# Patient Record
Sex: Male | Born: 1935 | Race: White | Hispanic: No | Marital: Married | Smoking: Former smoker
Health system: Southern US, Community
[De-identification: ages and names within clinical notes are randomized; demographics above are authoritative.]

## PROBLEM LIST (undated history)

## (undated) DIAGNOSIS — Z8673 Personal history of transient ischemic attack (TIA), and cerebral infarction without residual deficits: Secondary | ICD-10-CM

## (undated) DIAGNOSIS — IMO0001 Reserved for inherently not codable concepts without codable children: Secondary | ICD-10-CM

## (undated) DIAGNOSIS — J449 Chronic obstructive pulmonary disease, unspecified: Secondary | ICD-10-CM

## (undated) DIAGNOSIS — I471 Supraventricular tachycardia, unspecified: Secondary | ICD-10-CM

## (undated) DIAGNOSIS — K449 Diaphragmatic hernia without obstruction or gangrene: Secondary | ICD-10-CM

## (undated) DIAGNOSIS — D369 Benign neoplasm, unspecified site: Secondary | ICD-10-CM

## (undated) DIAGNOSIS — K219 Gastro-esophageal reflux disease without esophagitis: Secondary | ICD-10-CM

## (undated) DIAGNOSIS — I10 Essential (primary) hypertension: Secondary | ICD-10-CM

## (undated) DIAGNOSIS — M1990 Unspecified osteoarthritis, unspecified site: Secondary | ICD-10-CM

## (undated) DIAGNOSIS — I480 Paroxysmal atrial fibrillation: Secondary | ICD-10-CM

## (undated) DIAGNOSIS — I2510 Atherosclerotic heart disease of native coronary artery without angina pectoris: Secondary | ICD-10-CM

## (undated) DIAGNOSIS — L723 Sebaceous cyst: Secondary | ICD-10-CM

## (undated) DIAGNOSIS — R011 Cardiac murmur, unspecified: Secondary | ICD-10-CM

## (undated) DIAGNOSIS — E785 Hyperlipidemia, unspecified: Secondary | ICD-10-CM

## (undated) DIAGNOSIS — J189 Pneumonia, unspecified organism: Secondary | ICD-10-CM

## (undated) DIAGNOSIS — F419 Anxiety disorder, unspecified: Secondary | ICD-10-CM

## (undated) DIAGNOSIS — I440 Atrioventricular block, first degree: Secondary | ICD-10-CM

## (undated) HISTORY — DX: Chronic obstructive pulmonary disease, unspecified: J44.9

## (undated) HISTORY — DX: Supraventricular tachycardia: I47.1

## (undated) HISTORY — DX: Sebaceous cyst: L72.3

## (undated) HISTORY — DX: Gastro-esophageal reflux disease without esophagitis: K21.9

## (undated) HISTORY — DX: Personal history of transient ischemic attack (TIA), and cerebral infarction without residual deficits: Z86.73

## (undated) HISTORY — DX: Diaphragmatic hernia without obstruction or gangrene: K44.9

## (undated) HISTORY — DX: Atrioventricular block, first degree: I44.0

## (undated) HISTORY — DX: Atherosclerotic heart disease of native coronary artery without angina pectoris: I25.10

## (undated) HISTORY — DX: Hyperlipidemia, unspecified: E78.5

## (undated) HISTORY — DX: Anxiety disorder, unspecified: F41.9

## (undated) HISTORY — DX: Benign neoplasm, unspecified site: D36.9

## (undated) HISTORY — DX: Supraventricular tachycardia, unspecified: I47.10

## (undated) HISTORY — DX: Paroxysmal atrial fibrillation: I48.0

---

## 1978-09-12 HISTORY — PX: BACK SURGERY: SHX140

## 1998-04-25 ENCOUNTER — Emergency Department (HOSPITAL_COMMUNITY): Admission: EM | Admit: 1998-04-25 | Discharge: 1998-04-25 | Payer: Self-pay | Admitting: Emergency Medicine

## 2003-12-19 ENCOUNTER — Emergency Department (HOSPITAL_COMMUNITY): Admission: EM | Admit: 2003-12-19 | Discharge: 2003-12-19 | Payer: Self-pay | Admitting: *Deleted

## 2004-10-03 ENCOUNTER — Inpatient Hospital Stay (HOSPITAL_COMMUNITY): Admission: EM | Admit: 2004-10-03 | Discharge: 2004-10-04 | Payer: Self-pay | Admitting: Emergency Medicine

## 2005-06-14 ENCOUNTER — Ambulatory Visit: Payer: Self-pay | Admitting: Gastroenterology

## 2005-07-05 ENCOUNTER — Encounter (INDEPENDENT_AMBULATORY_CARE_PROVIDER_SITE_OTHER): Payer: Self-pay | Admitting: *Deleted

## 2005-07-05 ENCOUNTER — Ambulatory Visit: Payer: Self-pay | Admitting: Gastroenterology

## 2005-09-09 ENCOUNTER — Emergency Department (HOSPITAL_COMMUNITY): Admission: EM | Admit: 2005-09-09 | Discharge: 2005-09-09 | Payer: Self-pay | Admitting: Emergency Medicine

## 2005-09-27 ENCOUNTER — Ambulatory Visit (HOSPITAL_COMMUNITY): Admission: RE | Admit: 2005-09-27 | Discharge: 2005-09-27 | Payer: Self-pay | Admitting: Family Medicine

## 2005-10-05 ENCOUNTER — Ambulatory Visit (HOSPITAL_COMMUNITY): Admission: RE | Admit: 2005-10-05 | Discharge: 2005-10-05 | Payer: Self-pay | Admitting: General Surgery

## 2005-10-14 ENCOUNTER — Emergency Department (HOSPITAL_COMMUNITY): Admission: EM | Admit: 2005-10-14 | Discharge: 2005-10-14 | Payer: Self-pay | Admitting: Emergency Medicine

## 2006-02-26 ENCOUNTER — Encounter: Payer: Self-pay | Admitting: Emergency Medicine

## 2006-02-26 ENCOUNTER — Inpatient Hospital Stay (HOSPITAL_COMMUNITY): Admission: EM | Admit: 2006-02-26 | Discharge: 2006-03-02 | Payer: Self-pay | Admitting: Internal Medicine

## 2006-05-06 ENCOUNTER — Observation Stay (HOSPITAL_COMMUNITY): Admission: EM | Admit: 2006-05-06 | Discharge: 2006-05-07 | Payer: Self-pay | Admitting: *Deleted

## 2006-05-19 ENCOUNTER — Ambulatory Visit (HOSPITAL_COMMUNITY): Admission: RE | Admit: 2006-05-19 | Discharge: 2006-05-19 | Payer: Self-pay | Admitting: Internal Medicine

## 2006-05-19 ENCOUNTER — Encounter: Payer: Self-pay | Admitting: Vascular Surgery

## 2006-09-12 HISTORY — PX: CARDIAC ELECTROPHYSIOLOGY MAPPING AND ABLATION: SHX1292

## 2007-09-13 HISTORY — PX: CARDIOVERSION: SHX1299

## 2007-11-14 ENCOUNTER — Inpatient Hospital Stay (HOSPITAL_COMMUNITY): Admission: EM | Admit: 2007-11-14 | Discharge: 2007-11-16 | Payer: Self-pay | Admitting: Emergency Medicine

## 2007-12-17 ENCOUNTER — Encounter: Payer: Self-pay | Admitting: Family Medicine

## 2007-12-17 ENCOUNTER — Ambulatory Visit: Payer: Self-pay

## 2007-12-24 ENCOUNTER — Ambulatory Visit: Payer: Self-pay | Admitting: *Deleted

## 2007-12-24 ENCOUNTER — Inpatient Hospital Stay (HOSPITAL_COMMUNITY): Admission: EM | Admit: 2007-12-24 | Discharge: 2008-01-03 | Payer: Self-pay | Admitting: Emergency Medicine

## 2008-01-08 ENCOUNTER — Ambulatory Visit: Payer: Self-pay | Admitting: Cardiovascular Disease

## 2008-01-14 ENCOUNTER — Ambulatory Visit: Payer: Self-pay | Admitting: Internal Medicine

## 2008-01-23 ENCOUNTER — Ambulatory Visit: Payer: Self-pay | Admitting: Cardiology

## 2008-02-07 ENCOUNTER — Ambulatory Visit: Payer: Self-pay | Admitting: Cardiology

## 2008-02-13 ENCOUNTER — Ambulatory Visit: Payer: Self-pay | Admitting: Cardiology

## 2008-02-13 ENCOUNTER — Ambulatory Visit: Payer: Self-pay | Admitting: Internal Medicine

## 2008-02-22 ENCOUNTER — Ambulatory Visit: Payer: Self-pay | Admitting: Cardiovascular Disease

## 2008-02-22 ENCOUNTER — Ambulatory Visit: Payer: Self-pay

## 2008-03-07 ENCOUNTER — Ambulatory Visit: Payer: Self-pay | Admitting: Cardiology

## 2008-03-13 ENCOUNTER — Ambulatory Visit: Payer: Self-pay | Admitting: Internal Medicine

## 2008-03-13 LAB — CONVERTED CEMR LAB
BUN: 10 mg/dL (ref 6–23)
CO2: 30 meq/L (ref 19–32)
Chloride: 104 meq/L (ref 96–112)
Eosinophils Relative: 12.5 % — ABNORMAL HIGH (ref 0.0–5.0)
Glucose, Bld: 98 mg/dL (ref 70–99)
INR: 3.4 — ABNORMAL HIGH (ref 0.8–1.0)
Monocytes Relative: 7.5 % (ref 3.0–12.0)
Neutrophils Relative %: 57.9 % (ref 43.0–77.0)
Platelets: 213 10*3/uL (ref 150–400)
Potassium: 4.1 meq/L (ref 3.5–5.1)
WBC: 8.4 10*3/uL (ref 4.5–10.5)

## 2008-03-17 ENCOUNTER — Inpatient Hospital Stay (HOSPITAL_BASED_OUTPATIENT_CLINIC_OR_DEPARTMENT_OTHER): Admission: RE | Admit: 2008-03-17 | Discharge: 2008-03-17 | Payer: Self-pay | Admitting: Cardiovascular Disease

## 2008-03-17 ENCOUNTER — Ambulatory Visit: Payer: Self-pay | Admitting: Cardiology

## 2008-03-25 ENCOUNTER — Ambulatory Visit: Payer: Self-pay | Admitting: Internal Medicine

## 2008-05-05 ENCOUNTER — Ambulatory Visit: Payer: Self-pay | Admitting: Internal Medicine

## 2008-05-05 LAB — CONVERTED CEMR LAB
BUN: 13 mg/dL (ref 6–23)
CO2: 32 meq/L (ref 19–32)
Chloride: 108 meq/L (ref 96–112)
Creatinine, Ser: 1 mg/dL (ref 0.4–1.5)

## 2008-05-12 ENCOUNTER — Ambulatory Visit: Payer: Self-pay | Admitting: Cardiology

## 2008-05-26 ENCOUNTER — Ambulatory Visit: Payer: Self-pay | Admitting: Cardiology

## 2008-06-05 ENCOUNTER — Ambulatory Visit: Payer: Self-pay | Admitting: Cardiology

## 2008-06-05 ENCOUNTER — Emergency Department (HOSPITAL_COMMUNITY): Admission: EM | Admit: 2008-06-05 | Discharge: 2008-06-05 | Payer: Self-pay | Admitting: Emergency Medicine

## 2008-06-09 ENCOUNTER — Inpatient Hospital Stay (HOSPITAL_COMMUNITY): Admission: EM | Admit: 2008-06-09 | Discharge: 2008-06-10 | Payer: Self-pay | Admitting: Emergency Medicine

## 2008-06-16 ENCOUNTER — Ambulatory Visit: Payer: Self-pay | Admitting: Cardiology

## 2008-06-23 ENCOUNTER — Ambulatory Visit: Payer: Self-pay | Admitting: Cardiology

## 2008-06-26 ENCOUNTER — Ambulatory Visit: Payer: Self-pay | Admitting: Internal Medicine

## 2008-06-30 ENCOUNTER — Ambulatory Visit: Payer: Self-pay | Admitting: Internal Medicine

## 2008-07-14 ENCOUNTER — Ambulatory Visit: Payer: Self-pay | Admitting: Cardiovascular Disease

## 2008-07-28 ENCOUNTER — Ambulatory Visit: Payer: Self-pay | Admitting: Cardiology

## 2008-08-13 ENCOUNTER — Ambulatory Visit: Payer: Self-pay | Admitting: Internal Medicine

## 2008-08-20 ENCOUNTER — Ambulatory Visit: Payer: Self-pay | Admitting: Internal Medicine

## 2008-08-21 ENCOUNTER — Emergency Department (HOSPITAL_COMMUNITY): Admission: EM | Admit: 2008-08-21 | Discharge: 2008-08-21 | Payer: Self-pay | Admitting: Emergency Medicine

## 2008-08-24 ENCOUNTER — Inpatient Hospital Stay (HOSPITAL_COMMUNITY): Admission: EM | Admit: 2008-08-24 | Discharge: 2008-08-28 | Payer: Self-pay | Admitting: Emergency Medicine

## 2008-09-01 ENCOUNTER — Ambulatory Visit: Payer: Self-pay | Admitting: Cardiology

## 2008-09-22 ENCOUNTER — Ambulatory Visit: Payer: Self-pay | Admitting: Internal Medicine

## 2008-09-22 ENCOUNTER — Observation Stay (HOSPITAL_COMMUNITY): Admission: EM | Admit: 2008-09-22 | Discharge: 2008-09-23 | Payer: Self-pay | Admitting: Emergency Medicine

## 2008-09-23 ENCOUNTER — Encounter: Payer: Self-pay | Admitting: Internal Medicine

## 2008-09-26 ENCOUNTER — Ambulatory Visit: Payer: Self-pay | Admitting: Cardiology

## 2008-09-26 ENCOUNTER — Ambulatory Visit: Payer: Self-pay | Admitting: Internal Medicine

## 2008-10-09 ENCOUNTER — Ambulatory Visit: Payer: Self-pay | Admitting: Internal Medicine

## 2008-10-22 ENCOUNTER — Ambulatory Visit: Payer: Self-pay | Admitting: Internal Medicine

## 2008-11-19 ENCOUNTER — Inpatient Hospital Stay (HOSPITAL_COMMUNITY): Admission: EM | Admit: 2008-11-19 | Discharge: 2008-11-24 | Payer: Self-pay | Admitting: Emergency Medicine

## 2009-02-08 ENCOUNTER — Emergency Department (HOSPITAL_COMMUNITY): Admission: EM | Admit: 2009-02-08 | Discharge: 2009-02-08 | Payer: Self-pay | Admitting: Emergency Medicine

## 2009-02-10 ENCOUNTER — Encounter: Payer: Self-pay | Admitting: *Deleted

## 2009-03-03 ENCOUNTER — Ambulatory Visit: Payer: Self-pay | Admitting: Internal Medicine

## 2009-03-04 ENCOUNTER — Encounter: Payer: Self-pay | Admitting: Internal Medicine

## 2009-03-04 ENCOUNTER — Inpatient Hospital Stay (HOSPITAL_COMMUNITY): Admission: EM | Admit: 2009-03-04 | Discharge: 2009-03-05 | Payer: Self-pay | Admitting: Emergency Medicine

## 2009-03-04 ENCOUNTER — Telehealth (INDEPENDENT_AMBULATORY_CARE_PROVIDER_SITE_OTHER): Payer: Self-pay

## 2009-03-05 ENCOUNTER — Encounter: Payer: Self-pay | Admitting: Internal Medicine

## 2009-03-05 ENCOUNTER — Ambulatory Visit: Payer: Self-pay

## 2009-03-10 ENCOUNTER — Telehealth: Payer: Self-pay | Admitting: Internal Medicine

## 2009-03-18 ENCOUNTER — Encounter: Payer: Self-pay | Admitting: *Deleted

## 2009-03-22 ENCOUNTER — Inpatient Hospital Stay (HOSPITAL_COMMUNITY): Admission: EM | Admit: 2009-03-22 | Discharge: 2009-03-26 | Payer: Self-pay | Admitting: Emergency Medicine

## 2009-05-15 DIAGNOSIS — K219 Gastro-esophageal reflux disease without esophagitis: Secondary | ICD-10-CM

## 2009-05-15 DIAGNOSIS — G459 Transient cerebral ischemic attack, unspecified: Secondary | ICD-10-CM | POA: Insufficient documentation

## 2009-05-15 DIAGNOSIS — I4891 Unspecified atrial fibrillation: Secondary | ICD-10-CM | POA: Insufficient documentation

## 2009-05-15 DIAGNOSIS — E785 Hyperlipidemia, unspecified: Secondary | ICD-10-CM | POA: Insufficient documentation

## 2009-05-15 DIAGNOSIS — J439 Emphysema, unspecified: Secondary | ICD-10-CM | POA: Insufficient documentation

## 2009-05-19 ENCOUNTER — Ambulatory Visit: Payer: Self-pay | Admitting: Internal Medicine

## 2009-06-06 IMAGING — CT CT ANGIO CHEST
2 of 7 series · 19 of 36 positions shown · IV contrast (APPLIED)
Comparison: Chest radiograph 12/24/2007 and earlier.

CLINICAL DATA: 72-year-old male with shortness of breath and cough.

CT ANGIOGRAPHY CHEST
TECHNIQUE: Multidetector CT imaging of the chest using the
standard protocol during bolus administration of intravenous
contrast. Multiplanar reconstructed images obtained and reviewed to
evaluate the vascular anatomy.
Contrast: 100 ml Omnipaque 350.

[Series 8: pulm embolism 1.0 b25f thins · axial · 0.73mm/px · z∈[-270,-22]mm · 18 of 276 slices shown]
[im 14/276  lung]
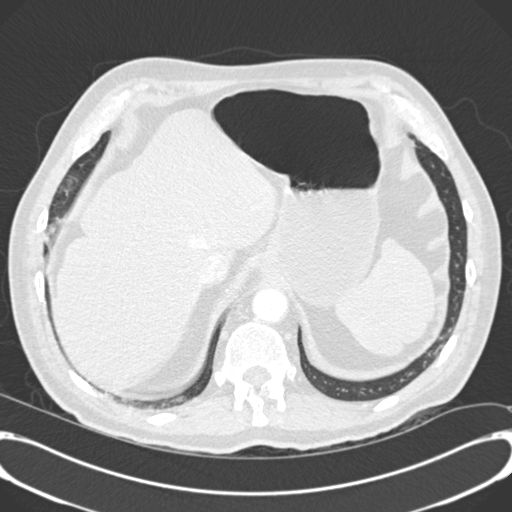
[im 28/276  mediastinal]
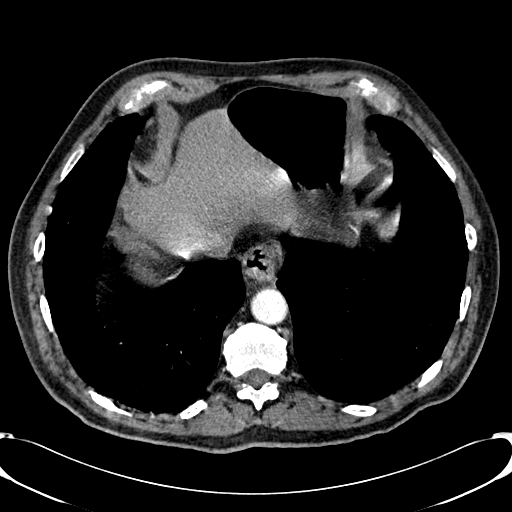
[im 42/276  lung]
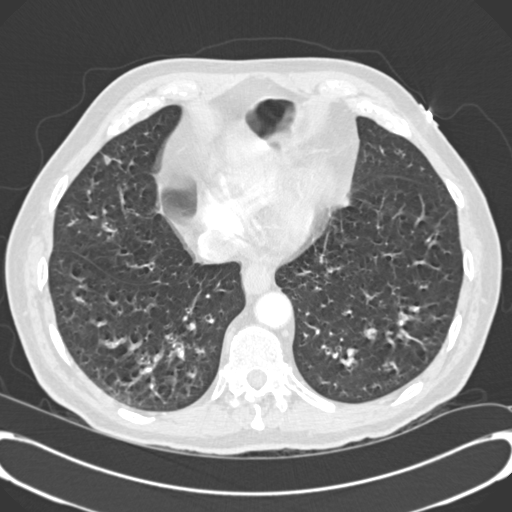
[im 56/276  mediastinal]
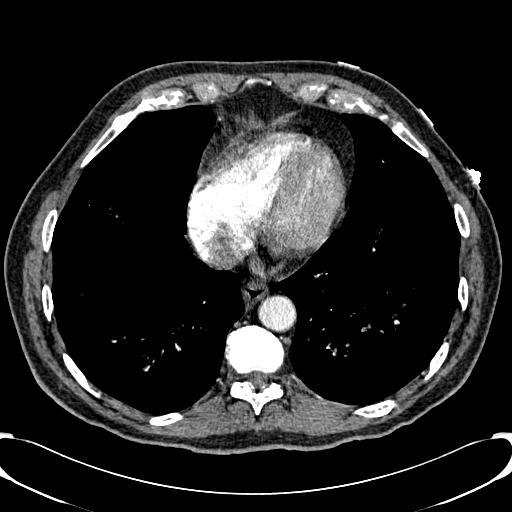
[im 69/276  lung]
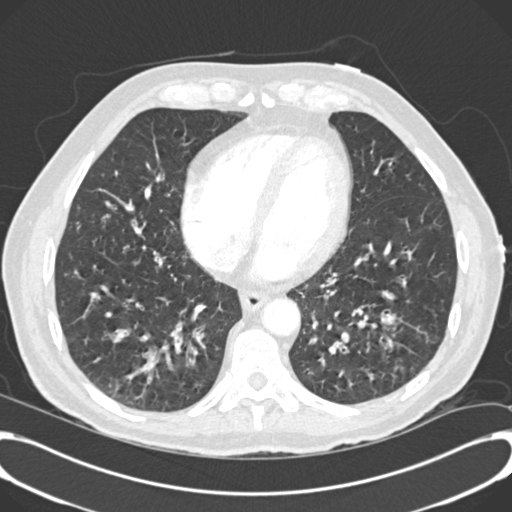
[im 83/276  mediastinal]
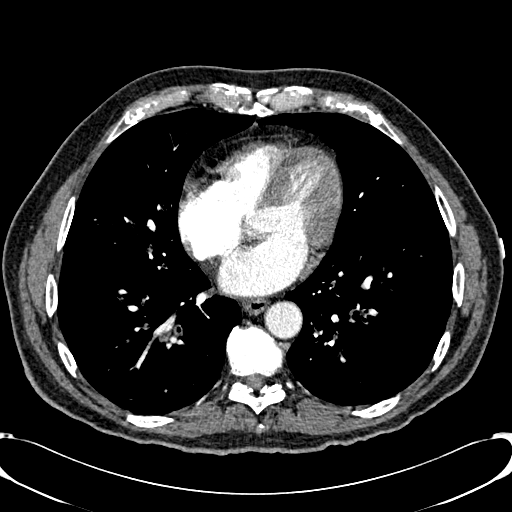
[im 97/276  lung]
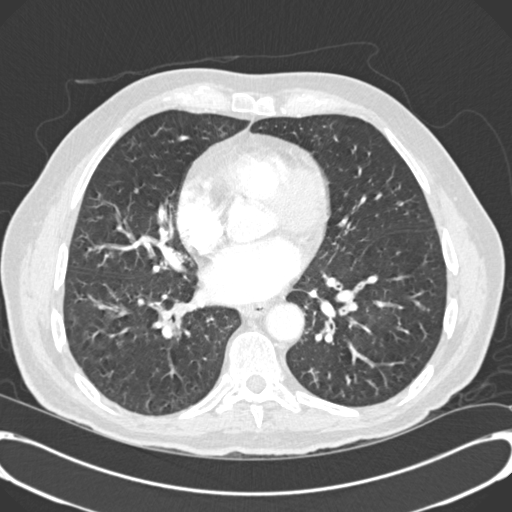
[im 111/276  mediastinal]
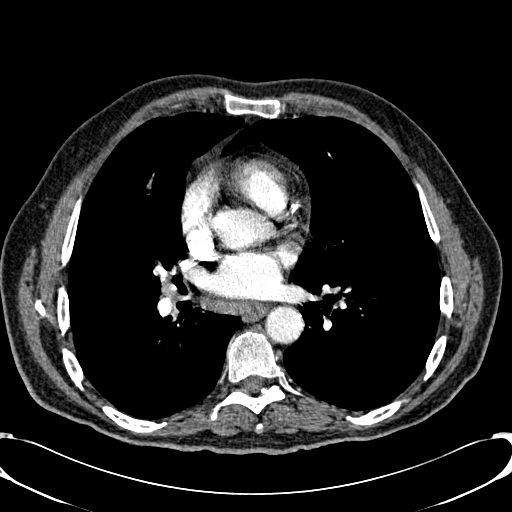
[im 124/276  lung]
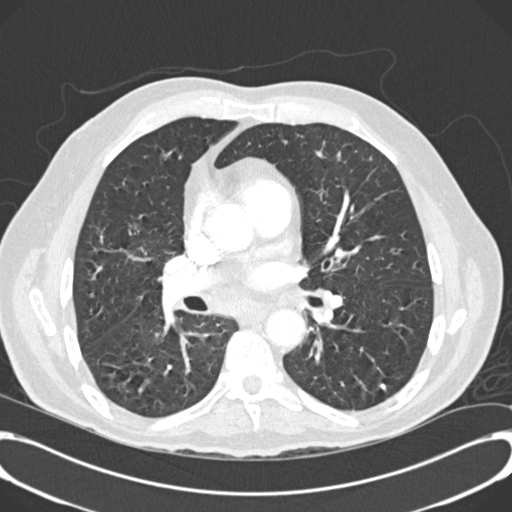
[im 152/276  mediastinal]
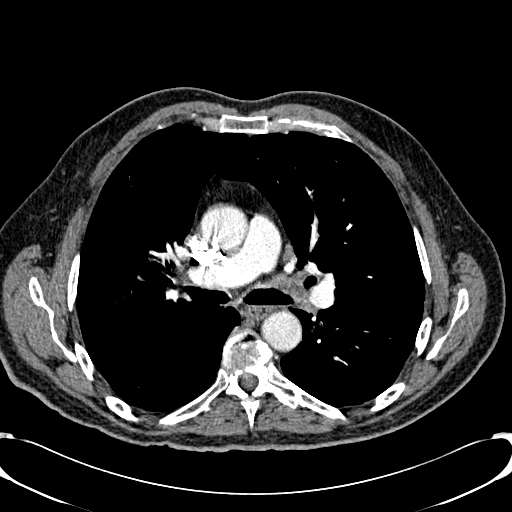
[im 166/276  lung]
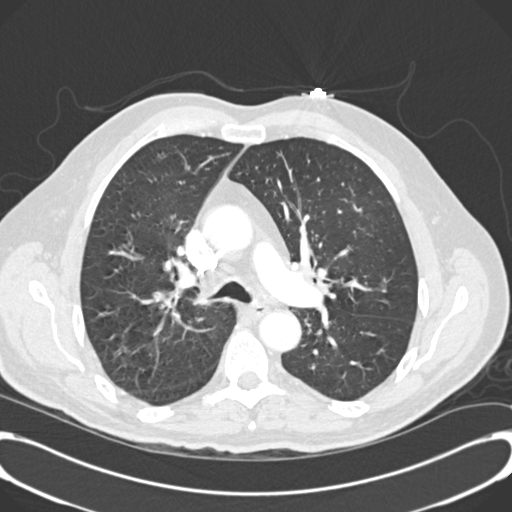
[im 179/276  mediastinal]
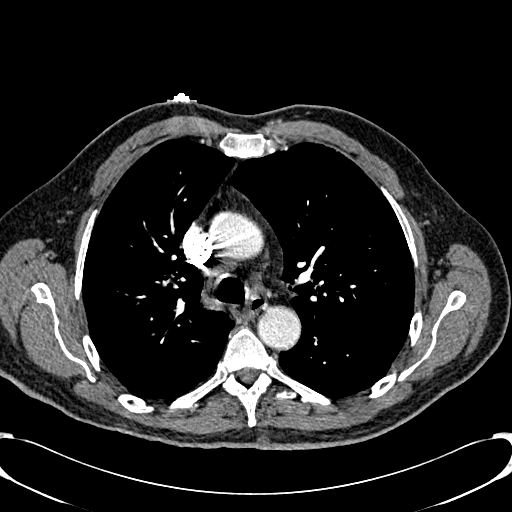
[im 193/276  lung]
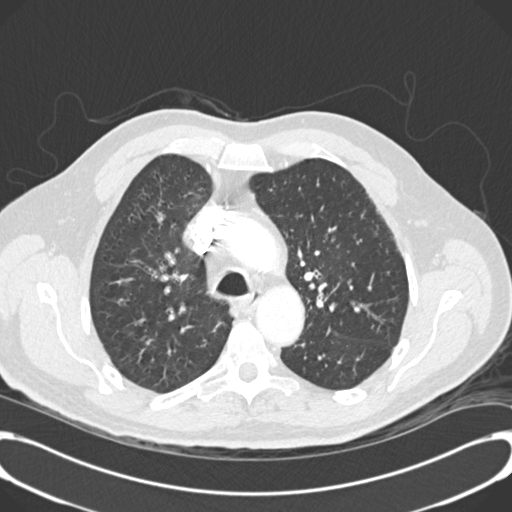
[im 207/276  mediastinal]
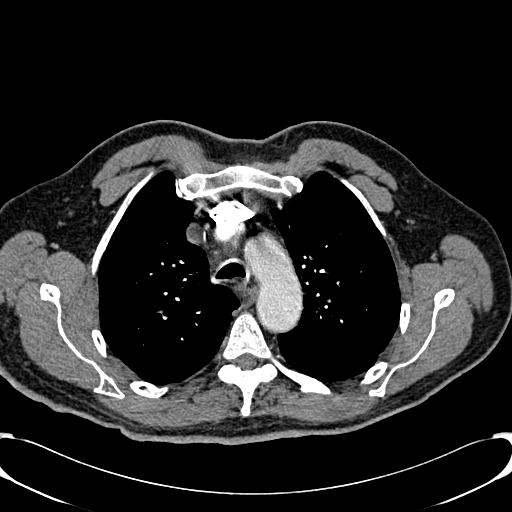
[im 221/276  lung]
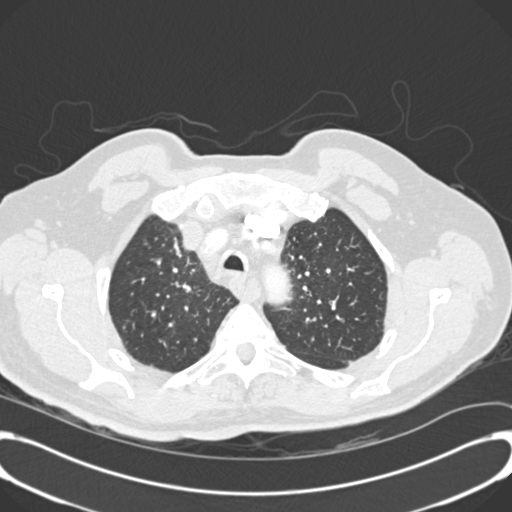
[im 234/276  mediastinal]
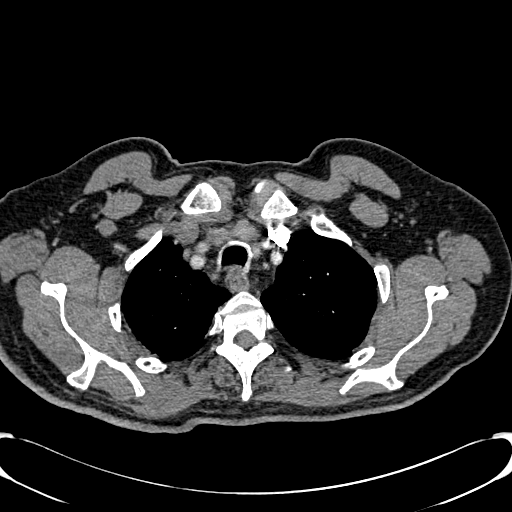
[im 248/276  lung]
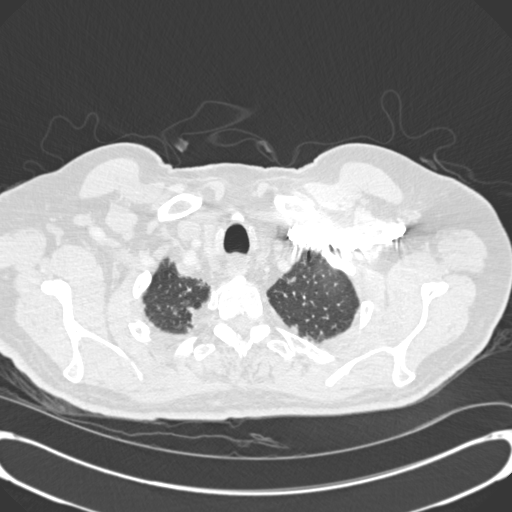
[im 262/276  mediastinal]
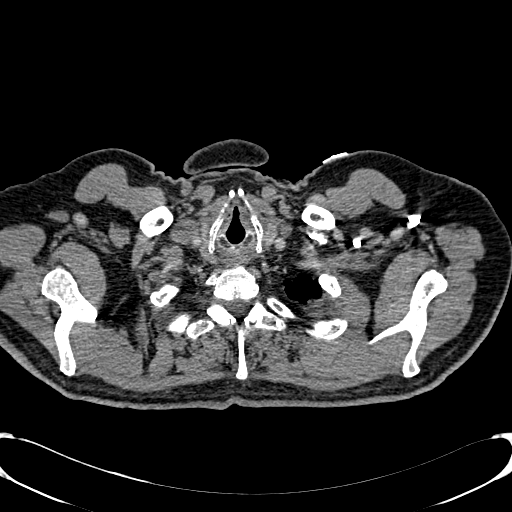

[Series 602: cor · coronal · 0.73mm/px · 1 of 133 slices shown]
[im 67/133  mediastinal]
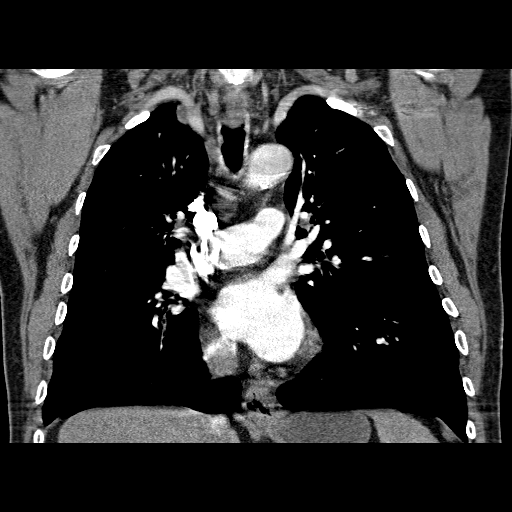

[19 of 36 positions shown; findings below may reference images not displayed]

FINDINGS: Adequate contrast bolus timing in the pulmonary arterial
tree.  Study degraded by intermittent respiratory motion artifact,
particularly at the lung bases.  No central pulmonary embolus.  No
focal filling defect identified in the segmental branches to
suggest pulmonary embolus.

No pericardial effusion.  Sub centimeter mediastinal lymph nodes.
No axillary lymphadenopathy.  Atherosclerosis of the aorta.
Visualized upper abdominal viscera within normal limits.

Respiratory motion artifact at the lung bases.  Major airways are
patent.  No definite focal airspace opacity.  There is a calcified
granuloma in the left lower lobe.  No pleural effusion.  No acute
osseous abnormality.
IMPRESSION: 1.  No acute pulmonary embolus identified.  Respiratory motion
artifact decreases sensitivity in subsegmental branches.
2.  No acute cardiopulmonary abnormality.

## 2009-07-07 ENCOUNTER — Inpatient Hospital Stay (HOSPITAL_COMMUNITY): Admission: EM | Admit: 2009-07-07 | Discharge: 2009-07-13 | Payer: Self-pay | Admitting: Emergency Medicine

## 2009-08-05 ENCOUNTER — Emergency Department (HOSPITAL_COMMUNITY): Admission: EM | Admit: 2009-08-05 | Discharge: 2009-08-05 | Payer: Self-pay | Admitting: Family Medicine

## 2009-12-28 ENCOUNTER — Telehealth (INDEPENDENT_AMBULATORY_CARE_PROVIDER_SITE_OTHER): Payer: Self-pay | Admitting: *Deleted

## 2010-02-23 ENCOUNTER — Ambulatory Visit: Payer: Self-pay | Admitting: Cardiology

## 2010-02-23 ENCOUNTER — Observation Stay (HOSPITAL_COMMUNITY): Admission: EM | Admit: 2010-02-23 | Discharge: 2010-02-24 | Payer: Self-pay | Admitting: Emergency Medicine

## 2010-03-01 ENCOUNTER — Telehealth (INDEPENDENT_AMBULATORY_CARE_PROVIDER_SITE_OTHER): Payer: Self-pay | Admitting: *Deleted

## 2010-03-02 ENCOUNTER — Ambulatory Visit: Payer: Self-pay | Admitting: Internal Medicine

## 2010-03-02 ENCOUNTER — Ambulatory Visit: Payer: Self-pay

## 2010-03-02 ENCOUNTER — Encounter (HOSPITAL_COMMUNITY): Admission: RE | Admit: 2010-03-02 | Discharge: 2010-05-31 | Payer: Self-pay | Admitting: Internal Medicine

## 2010-03-02 ENCOUNTER — Encounter: Payer: Self-pay | Admitting: Internal Medicine

## 2010-03-10 ENCOUNTER — Encounter: Payer: Self-pay | Admitting: Internal Medicine

## 2010-03-24 ENCOUNTER — Telehealth: Payer: Self-pay | Admitting: Internal Medicine

## 2010-03-28 ENCOUNTER — Emergency Department (HOSPITAL_COMMUNITY): Admission: EM | Admit: 2010-03-28 | Discharge: 2010-03-28 | Payer: Self-pay | Admitting: Emergency Medicine

## 2010-04-02 ENCOUNTER — Ambulatory Visit: Payer: Self-pay | Admitting: Cardiology

## 2010-04-02 ENCOUNTER — Inpatient Hospital Stay (HOSPITAL_COMMUNITY): Admission: EM | Admit: 2010-04-02 | Discharge: 2010-04-05 | Payer: Self-pay | Admitting: Emergency Medicine

## 2010-05-04 ENCOUNTER — Ambulatory Visit: Payer: Self-pay | Admitting: Internal Medicine

## 2010-05-04 DIAGNOSIS — I471 Supraventricular tachycardia: Secondary | ICD-10-CM

## 2010-05-13 ENCOUNTER — Ambulatory Visit: Payer: Self-pay | Admitting: Internal Medicine

## 2010-05-13 LAB — CONVERTED CEMR LAB
Basophils Absolute: 0 10*3/uL (ref 0.0–0.1)
CO2: 29 meq/L (ref 19–32)
Calcium: 9.5 mg/dL (ref 8.4–10.5)
Creatinine, Ser: 1 mg/dL (ref 0.4–1.5)
Eosinophils Absolute: 0.2 10*3/uL (ref 0.0–0.7)
Glucose, Bld: 84 mg/dL (ref 70–99)
Hemoglobin: 13 g/dL (ref 13.0–17.0)
INR: 1.5 — ABNORMAL HIGH (ref 0.8–1.0)
Lymphocytes Relative: 21.5 % (ref 12.0–46.0)
Lymphs Abs: 1.9 10*3/uL (ref 0.7–4.0)
MCHC: 33.7 g/dL (ref 30.0–36.0)
Monocytes Absolute: 0.7 10*3/uL (ref 0.1–1.0)
Neutro Abs: 6 10*3/uL (ref 1.4–7.7)
RDW: 13.9 % (ref 11.5–14.6)
Sodium: 140 meq/L (ref 135–145)

## 2010-05-19 ENCOUNTER — Ambulatory Visit: Payer: Self-pay | Admitting: Internal Medicine

## 2010-05-19 ENCOUNTER — Observation Stay (HOSPITAL_COMMUNITY): Admission: RE | Admit: 2010-05-19 | Discharge: 2010-05-20 | Payer: Self-pay | Admitting: Internal Medicine

## 2010-07-01 ENCOUNTER — Encounter: Payer: Self-pay | Admitting: Internal Medicine

## 2010-07-13 ENCOUNTER — Encounter: Payer: Self-pay | Admitting: Physician Assistant

## 2010-07-13 ENCOUNTER — Ambulatory Visit: Payer: Self-pay | Admitting: Cardiovascular Disease

## 2010-07-13 DIAGNOSIS — I440 Atrioventricular block, first degree: Secondary | ICD-10-CM

## 2010-08-31 ENCOUNTER — Inpatient Hospital Stay (HOSPITAL_COMMUNITY)
Admission: EM | Admit: 2010-08-31 | Discharge: 2010-09-03 | Payer: Self-pay | Source: Home / Self Care | Attending: Internal Medicine | Admitting: Internal Medicine

## 2010-09-12 HISTORY — PX: CYSTECTOMY: SHX5119

## 2010-10-14 NOTE — Progress Notes (Signed)
Summary: Nuclear Pre-Procedure  Phone Note Outgoing Call   Call placed by: Milana Na, EMT-P,  March 01, 2010 3:47 PM Summary of Call: Reviewed information on Myoview Information Sheet (see scanned document for further details). We tried to contact this patient and his AM stated that he's not taking any new calls.          Nuclear Med Background Indications for Stress Test: Evaluation for Ischemia, Post Hospital  Indications Comments: 03/04/09 Chest pain, (-) enzymes; ? pneumonia  History: Asthma, COPD, Echo, Emphysema, Heart Catheterization, Myocardial Perfusion Study  History Comments: 1/10 Echo: EF=65-70%, mild diastolic dysfunction; 6/09 MPS: sm. inferior infarct with moderate peri-infarct ischemia, EF=67%; 7/09 Cath: N/O CAD, EF=60%; Hx. PAF,CHF  Symptoms: Chest Pain, Chest Pressure, Diaphoresis, Dizziness, DOE, Fatigue, SOB  Symptoms Comments: chest pain>(L) arm   Nuclear Pre-Procedure Cardiac Risk Factors: CVA, Family History - CAD, History of Smoking, Hypertension, Lipids, Overweight, TIA Height (in): 71.5  Nuclear Med Study Referring MD:  Sherryl Manges MD

## 2010-10-14 NOTE — Progress Notes (Signed)
   Phone Note Other Incoming   Caller: pt wants form filled out for projected medical expenses for 2011 Summary of Call: received form from [employer]  apts asking for pt's estimated medical expenses for this year--gave form to kim mesiemore who will fax to [employer] to see if they can do this--nt Initial call taken by: Ledon Snare, RN,  December 28, 2009 4:06 PM    Faxed form oever to [employer]

## 2010-10-14 NOTE — Assessment & Plan Note (Signed)
Summary: f1y/mj  Medications Added POTASSIUM CHLORIDE CR 10 MEQ CR-CAPS (POTASSIUM CHLORIDE) Take one tablet by mouth daily      Allergies Added: NKDA  Visit Type:  Follow-up Primary [name]:  [name], MD in [place]   History of Present Illness: [name]  is a 76 yo male with a h/o parox. atrial fibrillation in the setting of chronic previously oxygen-dependent lung disease for which he was taking flecainide. He was hospitalized in July for syncope and was found to have supraventricular tachycardia.  He was set up for elective RFCA of SVT which was accomplished on 05/21/2010.  He is no longer on flecainide.  His coumadin is managed by his PCP.  Since his ablation, he denies any further palpitations.  He denies chest pain, orthopnea, PND, pedal edema or syncope.   He has chronic dyspnea from his COPD without significant change.     Current Medications (verified): 1)  Furosemide 40 Mg Tabs (Furosemide) .... Take One Tablet Daily 2)  Nexium 20 Mg Cpdr (Esomeprazole Magnesium) .... Once Daily 3)  Singulair 10 Mg Tabs (Montelukast Sodium) .... Once Daily 4)  Simvastatin 40 Mg Tabs (Simvastatin) .... At Bedtime 5)  Warfarin Sodium 2 Mg Tabs (Warfarin Sodium) .... As Directed By Coumadin Clinic 6)  Diltiazem Hcl 120 Mg Tabs (Diltiazem Hcl) .... Once Daily 7)  Xanax 0.25 Mg Tabs (Alprazolam) .... Take One Tablet As Needed 8)  Potassium Chloride Cr 10 Meq Cr-Caps (Potassium Chloride) .... Take One Tablet By Mouth Daily  Allergies (verified): No Known Drug Allergies  Past History:  Past Medical History: GERD (ICD-530.81) CAD (ICD-414.00)   a. nonobstructive by cath 2009   b. myoview 02/2010:  low risk HYPERLIPIDEMIA (ICD-272.4) COPD (ICD-496) TIA (ICD-435.9) PAROXYSMAL ATRIAL FIBRILLATION (ICD-427.31) SVT    a.  s/p RFCA 05/2010 First Degree AV Block  Past Surgical History: Back Surgery Hernia Repair Cardiac Cath X2  s/p SVT ablation   Vital Signs:  Patient profile:   76  year old male Height:      71.5 inches Weight:      204 pounds BMI:     28.16 Pulse rate:   78 / minute BP sitting:   120 / 62  (right arm)  Vitals Entered By: Laurance Flatten CMA (July 13, 2010 3:21 PM)  Physical Exam  General:  Well nourished, well developed, in no acute distress HEENT: normal Neck: no JVD Cardiac:  normal S1, S2; RRR; no murmur Lungs:  clear to auscultation bilaterally, no wheezing, rhonchi or rales Abd: soft, nontender, no hepatomegaly Ext: no edema Vascular: no carotid  bruits Skin: warm and dry    EKG  Procedure date:  07/13/2010  Findings:      Normal sinus rhythm with rate of:  78 First degree AV-Block noted.   normal axis no isch changes no significant change when compared to prior tracing   Impression & Recommendations:  Problem # 1:  SVT/ PSVT/ PAT (ICD-427.0) s/p RFCA 05/2010 No symptoms at this time.   Orders: EKG w/ Interpretation (93000)  Problem # 2:  PAROXYSMAL ATRIAL FIBRILLATION (ICD-427.31) Maintaining NSR. Coumadin managed by PCP.  Problem # 3:  HYPERLIPIDEMIA (ICD-272.4) Followed by PCP.  Problem # 4:  AV BLOCK, 1ST DEGREE (ICD-426.11) EKG unchanged in last year.  Patient Instructions: 1)  Your physician recommends that you schedule a follow-up appointment in: 3 months with Dr. Graciela Husbands 2)  Your physician recommends that you continue on your current medications as directed. Please refer to the Current  Medication list given to you today. Prescriptions: POTASSIUM CHLORIDE CR 10 MEQ CR-CAPS (POTASSIUM CHLORIDE) Take one tablet by mouth daily  #30 x 8   Entered by:   Ollen Gross, RN, BSN   Authorized by:   Tereso Newcomer PA-C   Signed by:   Ollen Gross, RN, BSN on 07/13/2010   Method used:   Print then Give to Patient   RxID:   [id number]   I have personally reviewed the prescriptions today for accuracy. Tereso Newcomer PA-C  July 13, 2010 5:09 PM

## 2010-10-14 NOTE — Assessment & Plan Note (Signed)
Summary: Cardiology Nuclear Study  Nuclear Med Background Indications for Stress Test: Evaluation for Ischemia, Post Hospital  Indications Comments: 02/24/09 chest pain, (-) enzymes  History: Asthma, COPD, Echo, Emphysema, Heart Catheterization, Myocardial Perfusion Study  History Comments: '09 Cath:n/o CAD; 1/10 Echo:EF=65-70%; 6/10 HYQ:MVHQIO scar with mild P.I. ischemia, EF=64%; h/o PAF, CHF  Symptoms: Chest Pressure, Chest Pressure with Exertion, Diaphoresis, Dizziness, DOE  Symptoms Comments: Last episode of NG:EXBM since d/c.   Nuclear Pre-Procedure Cardiac Risk Factors: CVA, Family History - CAD, History of Smoking, Hypertension, Lipids, Overweight, TIA Caffeine/Decaff Intake: None NPO After: 9:00 PM Lungs: Clear.  O2 Sat 96% on RA. IV 0.9% NS with Angio Cath: 20g     IV Site: (R) AC IV Started by: Stanton Kidney EMT-P Chest Size (in) 44     Height (in): 71.5 Weight (lb): 216 BMI: 29.81 Tech Comments: No medications taken today.  Nuclear Med Study 1 or 2 day study:  1 day     Stress Test Type:  Eugenie Birks Reading MD:  Arvilla Meres, MD     Referring MD:  Sherryl Manges, MD Resting Radionuclide:  Technetium 67m Tetrofosmin     Resting Radionuclide Dose:  11 mCi  Stress Radionuclide:  Technetium 89m Tetrofosmin     Stress Radionuclide Dose:  33 mCi   Stress Protocol   Lexiscan: 0.4 mg   Stress Test Technologist:  Rea [employer] CMA-N     Nuclear Technologist:  Domenic Polite CNMT  Rest Procedure  Myocardial perfusion imaging was performed at rest 45 minutes following the intravenous administration of Myoview Technetium 62m Tetrofosmin.  Stress Procedure  The patient received IV Lexiscan 0.4 mg over 15-seconds.  Myoview injected at 30-seconds.  There were no significant changes with infusion, other than rare PVC's.  He did c/o chest tightness and SOB with breath sounds diminished bilaterally in lower bases with good O2 Sats; combivent inhaler given with relief.   Quantitative spect images were obtained after a 45 minute delay.  QPS Raw Data Images:  There is interference from nuclear activity from structures below the diaphragm.  This does not affect the ability to read the study. Stress Images:  Decreased uptake in the inferior, inferoseptal and inferoapical walls. Rest Images:  Decreased uptake in the inferior, inferoseptal and inferoapical walls. Subtraction (SDS):  Previos inferior, inferoseptal and inferoapical infarct with mild peri-infarct ischemia. Transient Ischemic Dilatation:  1.2  (Normal <1.22)  Lung/Heart Ratio:  .29  (Normal <0.45)  Quantitative Gated Spect Images QGS EDV:  105 ml QGS ESV:  35 ml QGS EF:  67 % QGS cine images:  Normal  Findings Low risk nuclear study      Overall Impression  Exercise Capacity: Lexiscan study with no exercise. ECG Impression: Baseline: NSR; No significant ST segment change with Lexiscan. Overall Impression: High risk stress nuclear study. Overall Impression Comments: Decreased uptake in the inferior, inferoseptal and inferoapical walls with mild peri-infarct ischemia.  Appended Document: Cardiology Nuclear Study Attempted to call, "person not accepting calls at this time"  Appended Document: Cardiology Nuclear Study ok. low-risk.   Appended Document: Cardiology Nuclear Study called pt and received this mess on pts phone, "person not accepting calls at this time", letter mailed to call office for results

## 2010-10-14 NOTE — Letter (Signed)
Summary: ELectrophysiology/Ablation Procedure Instructions  [facility], Main Office  [address]   [place], [place] [address]   Phone: [phone]  Fax: [phone]     Electrophysiology/Ablation Procedure Instructions    You are scheduled for a(n) SVT ABLATION  on 05/19/10 at 11:30 with Dr.  Graciela Husbands  1.  Please come to the Short Stay Center at [facility] at 9:30 on the day of your procedure.  2.  Come prepared to stay overnight.   Please bring your insurance cards and a list of your medications.  3.  Come to the [place] office on 05/13/10 at 2:30pm for lab work. You do not have to be fasting.  4.  Do not have anything to eat or drink after midnight the night before your procedure.  5.  Do NOT take these medications for 5 days prior-take last dose of Warfarin on 05/13/10 and hold Furosemide the morning of procedure unless otherwise instructed:    All of your remaining medications may be taken with a small amount of water.  6.  Educational material received:  _____ EP   _____ Ablation   * Occasionally, EP studies and ablations can become lengthy.  Please make your family aware of this before your procedure starts.  Average time ranges from 2-8 hours for EP studies/ablations.  Your physician will locate your family after the procedure with the results.  * If you have any questions after you get home, please call the office at [phone]. Wynona Canes [place]  [phone]

## 2010-10-14 NOTE — Progress Notes (Signed)
Summary: myoview results   Phone Note Call from Patient Call back at Home Phone [phone]   Caller: Patient Summary of Call: Pt notified of myoview results. Gypsy Balsam RN BSN  March 24, 2010 9:28 AM

## 2010-10-14 NOTE — Letter (Signed)
Summary: Generic Letter  [facility], Main Office  [address]   [place], [place] [address]   Phone: [phone]  Fax: [phone]        March 10, 2010 MRN: [id number]    [name] [address] [place], [place]  [address]    Dear Mr. Bankhead,  We have been unable to reach you by phone regarding your test results.  Please give our office a call to receive your results.     Sincerely,  Meredith Staggers, RN Sherryl Manges, MD  This letter has been electronically signed by your physician.

## 2010-10-14 NOTE — Assessment & Plan Note (Signed)
Summary: F4W  Medications Added XANAX 0.25 MG TABS (ALPRAZOLAM) take one tablet as needed      Allergies Added: NKDA  History of Present Illness: [name]  is seen in followup for atrial fibrillation in the setting of chronic  previously oxygen-dependent lung disease for which he was taking flecainide. His recently hospitalized for syncope was found to have supraventricular tachycardia  It was decided to discharge him and bring him back to schedule elective catheter ablation study.  He denies palpitations. He says he is feeling better  He has had an intecurrent low risk nuclear study  Current Medications (verified): 1)  Furosemide 40 Mg Tabs (Furosemide) .... Take One Tablet Daily 2)  Nexium 20 Mg Cpdr (Esomeprazole Magnesium) .... Once Daily 3)  Singulair 10 Mg Tabs (Montelukast Sodium) .... Once Daily 4)  Simvastatin 40 Mg Tabs (Simvastatin) .... At Bedtime 5)  Warfarin Sodium 2 Mg Tabs (Warfarin Sodium) .... As Directed By Coumadin Clinic 6)  Diltiazem Hcl 120 Mg Tabs (Diltiazem Hcl) .... Once Daily 7)  Xanax 0.25 Mg Tabs (Alprazolam) .... Take One Tablet As Needed  Allergies (verified): No Known Drug Allergies  Past History:  Past Medical History: Last updated: 05/15/2009 GERD (ICD-530.81) CAD (ICD-414.00) HYPERLIPIDEMIA (ICD-272.4) COPD (ICD-496) TIA (ICD-435.9) PAROXYSMAL ATRIAL FIBRILLATION (ICD-427.31)  Past Surgical History: Last updated: 05/15/2009 Back Surgery Hernia Repair Cardiac Cath X2   Family History: Last updated: 05/15/2009  Positive for coronary artery disease in his father who   also had COPD and his mother died of a ruptured cerebral aneurysm.      Social History: Last updated: 05/15/2009  The patient has a remote history of smoking.  He   reports heavy smoking, smokes four packs cigarettes daily, but he quit 5  years ago.  He denies any alcohol or illicit drug usage.   Vital Signs:  Patient profile:   76 year old male Height:      71.5  inches Weight:      201 pounds BMI:     27.74 Pulse rate:   76 / minute Pulse rhythm:   regular BP sitting:   134 / 70  (left arm) Cuff size:   regular  Vitals Entered By: Judithe Modest CMA (May 04, 2010 2:23 PM)  Physical Exam  General:  The patient was alert and oriented in no acute distress. HEENT Normal.  Neck veins were flat, carotids were brisk.  Lungs were clear.  Heart sounds were regular without murmurs or gallops.  Abdomen was soft with active bowel sounds. There is no clubbing cyanosis or edema. Skin Warm and dry    Impression & Recommendations:  Problem # 1:  SVT/ PSVT/ PAT (ICD-427.0) the patient was found to have SVT. The possibility exists that the atrial fibrillation is secondary and as such catheter ablation might serve as a primary therapy for both arrhythmias.  We discussed undertaking EP study and catheter ablation with potential benefits as well as the potential risks including but not limited to death perforation heart block requiring pacemaker implantation and vascular injury. We'll plan to proceed. We will plan to hold his warfarin for 5 days prior to the procedure, both his prior history of a TIA he will likely resume the warfarin at the end of the procedure  Problem # 2:  PAROXYSMAL ATRIAL FIBRILLATION (ICD-427.31) See the above The following medications were removed from the medication list:    Flecainide Acetate 100 Mg Tabs (Flecainide acetate) .Marland Kitchen... Take one tablet by mouth every 12 hours  Aspirin 81 Mg Tabs (Aspirin) ..... Once daily His updated medication list for this problem includes:    Warfarin Sodium 2 Mg Tabs (Warfarin sodium) .Marland Kitchen... As directed by coumadin clinic  Problem # 3:  COPD (ICD-496) stable His updated medication list for this problem includes:    Singulair 10 Mg Tabs (Montelukast sodium) ..... Once daily  Patient Instructions: 1)  Your physician recommends that you schedule a follow-up appointment in: after ablation 2)   Your physician recommends that you return for lab work in: 3)  Your physician recommends that you continue on your current medications as directed. Please refer to the Current Medication list given to you today.

## 2010-10-14 NOTE — Miscellaneous (Signed)
Summary: [facility]  Clinical Lists Changes  Observations: Added new observation of NUCLEAR NOS: QPS  Raw Data Images:  There is interference from nuclear activity from structures below the diaphragm.  This does not affect the ability to read the study. Stress Images:  Decreased uptake in the inferior, inferoseptal and inferoapical walls. Rest Images:  Decreased uptake in the inferior, inferoseptal and inferoapical walls. Subtraction (SDS):  Previos inferior, inferoseptal and inferoapical infarct with mild peri-infarct ischemia. Transient Ischemic Dilatation:  1.2  (Normal <1.22)  Lung/Heart Ratio:  .29  (Normal <0.45)  Quantitative Gated Spect Images  QGS EDV:  105 ml QGS ESV:  35 ml QGS EF:  67 % QGS cine images:  Normal  Findings  Low risk nuclear study      Overall Impression   Exercise Capacity: Lexiscan study with no exercise. ECG Impression: Baseline: NSR; No significant ST segment change with Lexiscan. Overall Impression: High risk stress nuclear study. Overall Impression Comments: Decreased uptake in the inferior, inferoseptal and inferoapical walls with mild peri-infarct ischemia.    Signed by Dolores Patty, MD, [facility] on 03/02/2010 at 3:02 PM  Signed by Nathen May, MD, [facility] on 03/08/2010 at 6:04 PM (03/02/2010 11:43)      Nuclear Study  Procedure date:  03/02/2010  Findings:      QPS  Raw Data Images:  There is interference from nuclear activity from structures below the diaphragm.  This does not affect the ability to read the study. Stress Images:  Decreased uptake in the inferior, inferoseptal and inferoapical walls. Rest Images:  Decreased uptake in the inferior, inferoseptal and inferoapical walls. Subtraction (SDS):  Previos inferior, inferoseptal and inferoapical infarct with mild peri-infarct ischemia. Transient Ischemic Dilatation:  1.2  (Normal <1.22)  Lung/Heart Ratio:  .29  (Normal <0.45)  Quantitative Gated Spect  Images  QGS EDV:  105 ml QGS ESV:  35 ml QGS EF:  67 % QGS cine images:  Normal  Findings  Low risk nuclear study      Overall Impression   Exercise Capacity: Lexiscan study with no exercise. ECG Impression: Baseline: NSR; No significant ST segment change with Lexiscan. Overall Impression: High risk stress nuclear study. Overall Impression Comments: Decreased uptake in the inferior, inferoseptal and inferoapical walls with mild peri-infarct ischemia.    Signed by Dolores Patty, MD, [facility] on 03/02/2010 at 3:02 PM  Signed by Nathen May, MD, [facility] on 03/08/2010 at 6:04 PM

## 2010-10-14 NOTE — Progress Notes (Signed)
   [name] brought me papers that Pt dropped off " Medical Expense Verification" I have forwarded to [employer] [facility]  December 28, 2009 3:59 PM

## 2010-10-27 ENCOUNTER — Emergency Department (HOSPITAL_COMMUNITY): Payer: Medicare Other

## 2010-10-27 ENCOUNTER — Inpatient Hospital Stay (HOSPITAL_COMMUNITY)
Admission: EM | Admit: 2010-10-27 | Discharge: 2010-10-29 | DRG: 192 | Disposition: A | Discharge status: Home Health | Payer: Medicare Other | Attending: Internal Medicine | Admitting: Internal Medicine

## 2010-10-27 DIAGNOSIS — I2510 Atherosclerotic heart disease of native coronary artery without angina pectoris: Secondary | ICD-10-CM | POA: Diagnosis present

## 2010-10-27 DIAGNOSIS — J441 Chronic obstructive pulmonary disease with (acute) exacerbation: Principal | ICD-10-CM | POA: Diagnosis present

## 2010-10-27 DIAGNOSIS — I4891 Unspecified atrial fibrillation: Secondary | ICD-10-CM | POA: Diagnosis present

## 2010-10-27 DIAGNOSIS — E785 Hyperlipidemia, unspecified: Secondary | ICD-10-CM | POA: Diagnosis present

## 2010-10-27 DIAGNOSIS — Z79899 Other long term (current) drug therapy: Secondary | ICD-10-CM

## 2010-10-27 DIAGNOSIS — Z9981 Dependence on supplemental oxygen: Secondary | ICD-10-CM

## 2010-10-27 DIAGNOSIS — J449 Chronic obstructive pulmonary disease, unspecified: Secondary | ICD-10-CM | POA: Diagnosis present

## 2010-10-27 DIAGNOSIS — K449 Diaphragmatic hernia without obstruction or gangrene: Secondary | ICD-10-CM | POA: Diagnosis present

## 2010-10-27 DIAGNOSIS — Z8673 Personal history of transient ischemic attack (TIA), and cerebral infarction without residual deficits: Secondary | ICD-10-CM

## 2010-10-27 DIAGNOSIS — J4489 Other specified chronic obstructive pulmonary disease: Secondary | ICD-10-CM | POA: Diagnosis present

## 2010-10-27 DIAGNOSIS — Z7901 Long term (current) use of anticoagulants: Secondary | ICD-10-CM

## 2010-10-27 DIAGNOSIS — I252 Old myocardial infarction: Secondary | ICD-10-CM

## 2010-10-27 LAB — CBC
MCH: 27.7 pg (ref 26.0–34.0)
MCHC: 31.7 g/dL (ref 30.0–36.0)
MCV: 87.5 fL (ref 78.0–100.0)
Platelets: 182 10*3/uL (ref 150–400)
RDW: 13.5 % (ref 11.5–15.5)

## 2010-10-27 LAB — DIFFERENTIAL
Eosinophils Absolute: 0.7 10*3/uL (ref 0.0–0.7)
Eosinophils Relative: 9 % — ABNORMAL HIGH (ref 0–5)
Lymphs Abs: 1.6 10*3/uL (ref 0.7–4.0)
Monocytes Absolute: 1 10*3/uL (ref 0.1–1.0)
Monocytes Relative: 13 % — ABNORMAL HIGH (ref 3–12)

## 2010-10-27 LAB — BASIC METABOLIC PANEL
BUN: 8 mg/dL (ref 6–23)
Creatinine, Ser: 1.02 mg/dL (ref 0.4–1.5)
GFR calc non Af Amer: >60 mL/min (ref >60)

## 2010-10-27 LAB — TROPONIN I: Troponin I: 0.01 ng/mL (ref 0.00–0.06)

## 2010-10-27 NOTE — H&P (Signed)
NAMEMarland [name]  GARLON, TUGGLE NO.:  [id number]  MEDICAL RECORD NO.:  [id number]           PATIENT TYPE:  E  LOCATION:  [facility]                         FACILITY:  [facility]  PHYSICIAN:  Talmage Nap, MD  DATE OF BIRTH:  02-06-1935  DATE OF ADMISSION:  10/27/2010 DATE OF DISCHARGE:                             HISTORY & PHYSICAL   PRIMARY CARE PHYSICIAN:  [name], MD, Family Practice.  CARDIOLOGY:  [facility].  History is obtainable from the patient.  Chief complaint is shortness of breath of 3 days duration with cough.  The patient is a 76 year old Caucasian male with history of COPD, asthma and atrial fibrillation status post catheter ablation presenting to the emergency room with shortness of breath of 3 days' duration with associated cough.  The patient claims that he had been in stable health until 3 days prior to presenting to the emergency room.  He observed that he was short of breath and this shortness of breath was getting progressively worse. Associated with shortness of breath, he has had chest tightness and cough that is productive of yellowish sputum.  The patient currently had chills but denied any fever.  He denied any history of nausea or vomiting.  He denied any history of PND or orthopnea.  He denied any history of diarrhea or hematochezia but symptoms were said to be getting progressively worse, hence he presented to the emergency room to be evaluated.  PAST MEDICAL HISTORY:  Positive for; 1. Atrial fibrillation. 2. Asthma. 3. COPD. 4. History of diverticulosis. 5. History of TIA. 6. Hiatal hernia. 7. Coronary artery disease. 8. Prior MI. 9. Valvular heart disease  PAST SURGICAL HISTORY: 1. Atrial fibrillation status post catheter ablation. 2. Valvular heart disease status post valvuloplasty (type of valve     unknown). 3. Colonoscopy and finding is that of colon polyp and diverticulosis.  PREADMISSION MEDICATIONS: 1.  Warfarin 2 mg p.o. daily. 2. Potassium chloride 10 mEq 1 p.o. daily. 3. Singulair 10 mg 1 p.o. daily. 4. Diltiazem HCL ER 120 mg 1 p.o. daily. 5. Hydrocodone/APAP 5/500 one p.o. q.8-12 p.r.n. 6. Nexium 10 mg p.o. daily. 7. Furosemide 20 mg p.o. daily. 8. Alprazolam 0.5 mg one p.o. q.4-12 h. P.r.n. 9. Simvastatin 40 mg 1 p.o. nightly. 10.Albuterol inhaler b.i.d. 11.Spiriva HandiHaler once a day. 12.Combivent inhaler, does unknown.  ALLERGIES:  He has no known allergies.  SOCIAL HISTORY:  Negative for alcohol or tobacco use.  The patient is currently retired.  FAMILY HISTORY:  Positive for coronary artery disease.  REVIEW OF SYSTEMS:  The patient denies any history of headaches.  No blurred vision.  No nausea or vomiting.  He complained of mild shortness of breath with chest tightness and cough that is productive of yellowish sputum.  Denies any fever, but initially had chills.  No PND, orthopnea. No abdominal discomfort.  No diarrhea or hematochezia.  No dysuria or hematuria.  No swelling of the lower extremity.  No intolerance to heat or cold.  No neuropsychiatric disorder.Complain about pain and spasm on left side of the neck  PHYSICAL EXAMINATION:  GENERAL:  Elderly man not in  obvious respiratory distress.  Suboptimal hydration is present.Two moles on the head VITAL SIGNS:  Blood pressure is 122/70, pulse is 110, respiratory rate is 26, temperature is 98.4. HEENT:  Mild pallor, but pupils are reactive to light.  Extraocular muscles are intact. NECK:  No jugular venous distention.  No carotid bruit.  No lymphadenopathy.Mild tenderness left sternocleidomastoid muscle. CHEST:  Scattered rhonchi.  No rales.  Heart sounds are 1 and 2 with soft systolic murmur at the left sternal border. ABDOMEN:  Soft, nontender.  Liver, spleen, kidney not palpable.  Bowel sounds are positive. EXTREMITIES:  No pedal edema. NEUROLOGIC:  Nonfocal. MUSCULOSKELETAL:  Arthritic changes in the knees  and in the feet. NEUROPSYCHIATRIC:  Unremarkable. SKIN:  Decreased turgor.  LABORATORY DATA:  Initial hematological indices showed WBC of 7.5, hemoglobin of 14.0, hematocrit of 42.2, MCV 87.5 with a platelet count of 182, monocyte 13% elevated.  Chemistry showed sodium of 140, potassium of 3.9, chloride 110 with a bicarb of 31, glucose 85, BUN is 8, creatinine is 1.01.  First set of cardiac marker troponin-I 0.01 and BNP is less than 30.  Chest x-ray showed chronic emphysematous change. No infiltrates seen.  ADMITTING IMPRESSION: 1. Chronic obstructive pulmonary disease exacerbation. 2. Atrial fibrillation status post catheter ablation. 3. History of valvular heart disease status post valvuloplasty     (questionable type of valve). 4. History of coronary artery disease. 5. History of transient ischemic attack.  No neuro deficit. 6. Questionable spasm of the sternocleidomastoid muscles. 7. History of prior myocardial infarction.  PLAN:  To admit the patient to telemetry.  The patient will be slowly rehydrated with half-normal saline IV to go at a rate of 60 mL an hour. He will be on O2 via nasal cannula 3 times per minute.  Albuterol and Atrovent nebs q.4 h.  He will also be given Solu-Medrol 60 mg IV q.12. Other medications to be given to the patient will include Rocephin 1 g IV q.24, Zithromax 500 mg IV q.24 and then Coumadin, dosing to be done by pharmacy and also diltiazem ER 120 mg p.o. daily.  He will also be on Xanax 0.5 mg p.o. q.12 p.r.n. for anxiety, Zocor 40 mg p.o. daily, Protonix 40 mg IV q.24 for GI prophylaxis and since the patient complained about spasm of the sternocleidomastoid muscle on the left side, he will be on Flexeril 10 mg p.o. nightly p.r.n. and finally he also be on heparin 5000 units subcu q.8 h. Further workup to be done on this patient will include cardiac enzymes q.6 x3.  Coagulation profile, PT, PTT and INR will be repeated in the a.m. and then CBC,  CMP and magnesium will be repeated in a.m.  The patient will be followed and evaluated on day-to-day basis.     Talmage Nap, MD     CN/MEDQ  D:  10/27/2010  T:  10/27/2010  Job:  [id number]  Electronically Signed by Talmage Nap  on 10/27/2010 10:45:58 PM

## 2010-10-28 LAB — COMPREHENSIVE METABOLIC PANEL
ALT: 12 U/L (ref 0–53)
AST: 22 U/L (ref 0–37)
Albumin: 4 g/dL (ref 3.5–5.2)
CO2: 29 mEq/L (ref 19–32)
Calcium: 9.1 mg/dL (ref 8.4–10.5)
Chloride: 100 mEq/L (ref 96–112)
GFR calc Af Amer: >60 mL/min (ref >60)
GFR calc non Af Amer: >60 mL/min (ref >60)
Sodium: 140 mEq/L (ref 135–145)

## 2010-10-28 LAB — CBC
HCT: 43.7 % (ref 39.0–52.0)
MCV: 88.8 fL (ref 78.0–100.0)
Platelets: 193 10*3/uL (ref 150–400)
RBC: 4.92 MIL/uL (ref 4.22–5.81)
WBC: 4.6 10*3/uL (ref 4.0–10.5)

## 2010-10-28 LAB — CARDIAC PANEL(CRET KIN+CKTOT+MB+TROPI)
CK, MB: 2.1 ng/mL (ref 0.3–4.0)
Relative Index: INVALID (ref 0.0–2.5)
Total CK: 96 U/L (ref 7–232)
Total CK: 96 U/L (ref 7–232)
Troponin I: <0.01 ng/mL (ref 0.00–0.06)

## 2010-10-28 LAB — PROTIME-INR
INR: 2.18 — ABNORMAL HIGH (ref 0.00–1.49)
INR: 2.09 — ABNORMAL HIGH (ref 0.00–1.49)

## 2010-10-28 LAB — MAGNESIUM: Magnesium: 1.9 mg/dL (ref 1.5–2.5)

## 2010-10-29 LAB — BASIC METABOLIC PANEL
BUN: 14 mg/dL (ref 6–23)
Chloride: 102 mEq/L (ref 96–112)
GFR calc Af Amer: >60 mL/min (ref >60)
GFR calc non Af Amer: >60 mL/min (ref >60)
Potassium: 4 mEq/L (ref 3.5–5.1)
Sodium: 139 mEq/L (ref 135–145)

## 2010-10-29 LAB — CBC
HCT: 41.1 % (ref 39.0–52.0)
Hemoglobin: 13.6 g/dL (ref 13.0–17.0)
MCH: 29.2 pg (ref 26.0–34.0)
MCHC: 33.1 g/dL (ref 30.0–36.0)
MCV: 88.4 fL (ref 78.0–100.0)
RDW: 13.6 % (ref 11.5–15.5)

## 2010-10-29 LAB — PROTIME-INR: INR: 2.16 — ABNORMAL HIGH (ref 0.00–1.49)

## 2010-11-01 ENCOUNTER — Ambulatory Visit: Payer: Self-pay | Admitting: Internal Medicine

## 2010-11-02 NOTE — Discharge Summary (Signed)
[name], [name] NO.:  [id number]  MEDICAL RECORD NO.:  [id number]           PATIENT TYPE:  I  LOCATION:  4713                         FACILITY:  [facility]  PHYSICIAN:  Andreas Blower, MD       DATE OF BIRTH:  [birth year]  DATE OF ADMISSION:  10/27/2010 DATE OF DISCHARGE:  10/29/2010                              DISCHARGE SUMMARY   PRIMARY CARE PHYSICIAN:  [name], MD  CARDIOLOGIST:  [facility].  DISCHARGE DIAGNOSES: 1. Chronic obstructive pulmonary disease exacerbation. 2. History of atrial fibrillation, rate controlled. 3. Chronic anticoagulation for atrial fibrillation. 4. History of coronary artery disease. 5. History of transient ischemic attack. 6. Hyperlipidemia. 7. History of syncope from intractable cough. 8. History of transient ischemic attacks. 9. History of back surgery. 10.Status post right inguinal hernia repair. 11.O2-dependent chronic obstructive pulmonary disease at 2 liters per     minute, has not used oxygen on a regular basis.  DISCHARGE MEDICATIONS: 1. Albuterol 2.5 mg neb solution every 6 hours as needed for shortness     of breath. 2. Azithromycin 250 mg p.o. daily for 3 days. 3. Cefuroxime 500 mg p.o. twice daily for 5 days. 4. Ipratropium nebulizer 0.5 mg inhaled every 6 hours as needed for     shortness of breath. 5. Prednisone 60 mg p.o. daily for 1 day, then 40 mg p.o. daily for 2     days, then 20 mg p.o. daily for 2 days, then 10 mg p.o. daily for 2     days, then 5 mg p.o. daily for 2 days, then discontinue. 6. Albuterol inhaler 2 puffs every 4 hours as needed. 7. Combivent 1 puff inhaled twice daily. 8. Diltiazem 120 mg extended release p.o. q.p.m. 9. Furosemide 20 mg p.o. q.p.m. 10.Singulair 10 mg p.o. daily at bedtime. 11.Nexium 40 mg p.o. q.p.m. 12.Potassium chloride 20 mEq p.o. q.p.m. 13.Spiriva 1 capsule p.o. q.p.m. 14.Warfarin 2 mg every day except Thursday and Friday, he takes 4 mg     on  Thursday and Friday. 15.Alprazolam 0.5 mg 2 times a day as needed for anxiety. 16.Simvastatin 40 mg p.o. q.p.m.  BRIEF ADMITTING HISTORY AND PHYSICAL:  Mr. Fitzwater is a 76 year old Caucasian male with history of coronary artery disease, COPD dependent on O2, but noncompliant with oxygen use, presents with shortness of breath.  RADIOLOGY/IMAGING:  The patient had chest x-ray on October 27, 2010, which showed COPD/emphysema without evidence for acute cardiopulmonary disease.  LABS:  CBC shows white count of 6.4, hemoglobin 13.6, hematocrit 41.1, platelet count 192.  Electrolytes normal with a BUN of 1.02, INR 2.16. Liver function tests normal.  Troponin negative x3.  Blood cultures x2 shows no growth to date.  HOSPITAL COURSE BY PROBLEM: 1. Shortness of breath, most likely due to chronic obstructive     pulmonary disease exacerbation.  Initially, the patient was started     on steroids which was transitioned to p.o. during the course of     hospital stay.  He was also initially started on ceftriaxone and     azithromycin IV which was transitioned to p.o. azithromycin and  cefuroxime.  He will continue taking azithromycin for 3 more days     to complete a 5-day course of azithromycin and cefuroxime for 5     more days to complete a 7-day course.  The patient was instructed     after discharge to be compliant with oxygen use at home. 2. History of atrial fibrillation.  The patient is rate controlled.     Continued the patient on anticoagulation.  Continued the patient on     diltiazem. 3. History of coronary artery disease.  Troponins were negative x3.     INR is therapeutic. 4. History of transient ischemic attack, not an active issue at this     time. 5. Chronic anticoagulation.  INR is therapeutic.  Time spent on discharge talking to the patient, coordinating care was 25 minutes.   Andreas Blower, MD   SR/MEDQ  D:  10/29/2010  T:  10/30/2010  Job:  [id number]  cc:   [name], M.D.  Electronically Signed by Wardell Heath Paxon Propes  on 11/02/2010 03:15:53 PM

## 2010-11-03 LAB — CULTURE, BLOOD (ROUTINE X 2)
Culture  Setup Time: 201202160455
Culture  Setup Time: 201202160456
Culture: NO GROWTH

## 2010-11-22 LAB — BASIC METABOLIC PANEL
CO2: 27 mEq/L (ref 19–32)
Calcium: 8.8 mg/dL (ref 8.4–10.5)
Chloride: 105 mEq/L (ref 96–112)
Creatinine, Ser: 0.95 mg/dL (ref 0.4–1.5)
GFR calc Af Amer: >60 mL/min (ref >60)
GFR calc Af Amer: >60 mL/min (ref >60)
GFR calc Af Amer: >60 mL/min (ref >60)
GFR calc non Af Amer: >60 mL/min (ref >60)
GFR calc non Af Amer: >60 mL/min (ref >60)
Potassium: 3.3 mEq/L — ABNORMAL LOW (ref 3.5–5.1)
Potassium: 4.3 mEq/L (ref 3.5–5.1)
Potassium: 4.6 mEq/L (ref 3.5–5.1)
Sodium: 135 mEq/L (ref 135–145)
Sodium: 140 mEq/L (ref 135–145)

## 2010-11-22 LAB — BASIC METABOLIC PANEL WITH GFR
BUN: 16 mg/dL (ref 6–23)
Chloride: 98 meq/L (ref 96–112)
Creatinine, Ser: 1.26 mg/dL (ref 0.4–1.5)
GFR calc non Af Amer: 56 mL/min — ABNORMAL LOW (ref >60)
Glucose, Bld: 120 mg/dL — ABNORMAL HIGH (ref 70–99)

## 2010-11-22 LAB — DIFFERENTIAL
Basophils Absolute: 0 K/uL (ref 0.0–0.1)
Basophils Relative: 0 % (ref 0–1)
Eosinophils Absolute: 0 K/uL (ref 0.0–0.7)
Eosinophils Absolute: 0.1 10*3/uL (ref 0.0–0.7)
Eosinophils Relative: 0 % (ref 0–5)
Lymphocytes Relative: 7 % — ABNORMAL LOW (ref 12–46)
Lymphs Abs: 1.7 10*3/uL (ref 0.7–4.0)
Lymphs Abs: 2 K/uL (ref 0.7–4.0)
Monocytes Absolute: 2.3 10*3/uL — ABNORMAL HIGH (ref 0.1–1.0)
Monocytes Relative: 6 % (ref 3–12)
Monocytes Relative: 8 % (ref 3–12)
Neutro Abs: 15.7 10*3/uL — ABNORMAL HIGH (ref 1.7–7.7)
Neutro Abs: 24.4 10*3/uL — ABNORMAL HIGH (ref 1.7–7.7)
Neutrophils Relative %: 85 % — ABNORMAL HIGH (ref 43–77)
Neutrophils Relative %: 85 % — ABNORMAL HIGH (ref 43–77)

## 2010-11-22 LAB — CBC
HCT: 40 % (ref 39.0–52.0)
HCT: 40.5 % (ref 39.0–52.0)
HCT: 42.8 % (ref 39.0–52.0)
HCT: 43.9 % (ref 39.0–52.0)
Hemoglobin: 13.1 g/dL (ref 13.0–17.0)
Hemoglobin: 14.5 g/dL (ref 13.0–17.0)
Hemoglobin: 14.6 g/dL (ref 13.0–17.0)
MCH: 29.5 pg (ref 26.0–34.0)
MCH: 30 pg (ref 26.0–34.0)
MCHC: 32.5 g/dL (ref 30.0–36.0)
MCHC: 33.3 g/dL (ref 30.0–36.0)
MCHC: 33.9 g/dL (ref 30.0–36.0)
MCV: 88.4 fL (ref 78.0–100.0)
MCV: 88.7 fL (ref 78.0–100.0)
Platelets: 174 K/uL (ref 150–400)
RBC: 4.54 MIL/uL (ref 4.22–5.81)
RBC: 4.84 MIL/uL (ref 4.22–5.81)
RDW: 13.1 % (ref 11.5–15.5)
RDW: 13.1 % (ref 11.5–15.5)
WBC: 14.4 10*3/uL — ABNORMAL HIGH (ref 4.0–10.5)
WBC: 28.7 K/uL — ABNORMAL HIGH (ref 4.0–10.5)

## 2010-11-22 LAB — PROTIME-INR
INR: 1.91 — ABNORMAL HIGH (ref 0.00–1.49)
INR: 2.33 — ABNORMAL HIGH (ref 0.00–1.49)
INR: 2.36 — ABNORMAL HIGH (ref 0.00–1.49)
Prothrombin Time: 22 s — ABNORMAL HIGH (ref 11.6–15.2)
Prothrombin Time: 25.7 seconds — ABNORMAL HIGH (ref 11.6–15.2)

## 2010-11-22 LAB — CULTURE, RESPIRATORY W GRAM STAIN: Culture: NORMAL

## 2010-11-22 LAB — CULTURE, BLOOD (ROUTINE X 2)
Culture  Setup Time: 201112210231
Culture  Setup Time: 201112210232
Culture: NO GROWTH
Culture: NO GROWTH

## 2010-11-22 LAB — COMPREHENSIVE METABOLIC PANEL
ALT: 11 U/L (ref 0–53)
Calcium: 8.4 mg/dL (ref 8.4–10.5)
Glucose, Bld: 131 mg/dL — ABNORMAL HIGH (ref 70–99)
Sodium: 139 mEq/L (ref 135–145)
Total Protein: 5.6 g/dL — ABNORMAL LOW (ref 6.0–8.3)

## 2010-11-22 LAB — CARDIAC PANEL(CRET KIN+CKTOT+MB+TROPI)
CK, MB: 4.1 ng/mL — ABNORMAL HIGH (ref 0.3–4.0)
Relative Index: 3.2 — ABNORMAL HIGH (ref 0.0–2.5)
Total CK: 143 U/L (ref 7–232)
Troponin I: 0.04 ng/mL (ref 0.00–0.06)
Troponin I: 0.05 ng/mL (ref 0.00–0.06)

## 2010-11-22 LAB — STREP PNEUMONIAE URINARY ANTIGEN: Strep Pneumo Urinary Antigen: NEGATIVE

## 2010-11-22 LAB — LEGIONELLA ANTIGEN, URINE

## 2010-11-22 LAB — TROPONIN I: Troponin I: 0.03 ng/mL (ref 0.00–0.06)

## 2010-11-22 LAB — CK TOTAL AND CKMB (NOT AT ARMC)
Relative Index: 1.7 (ref 0.0–2.5)
Total CK: 173 U/L (ref 7–232)

## 2010-11-23 ENCOUNTER — Encounter (INDEPENDENT_AMBULATORY_CARE_PROVIDER_SITE_OTHER): Payer: Self-pay | Admitting: *Deleted

## 2010-11-25 LAB — PROTIME-INR
INR: 0.97 (ref 0.00–1.49)
INR: 0.99 (ref 0.00–1.49)
Prothrombin Time: 13.3 seconds (ref 11.6–15.2)

## 2010-11-27 LAB — LIPID PANEL
LDL Cholesterol: 59 mg/dL (ref 0–99)
Total CHOL/HDL Ratio: 2.6 RATIO
Triglycerides: 70 mg/dL (ref <150)
VLDL: 14 mg/dL (ref 0–40)

## 2010-11-27 LAB — URINALYSIS, ROUTINE W REFLEX MICROSCOPIC
Glucose, UA: NEGATIVE mg/dL
Leukocytes, UA: NEGATIVE
Nitrite: NEGATIVE
Protein, ur: 30 mg/dL — AB
Protein, ur: NEGATIVE mg/dL
Specific Gravity, Urine: 1.021 (ref 1.005–1.030)
Specific Gravity, Urine: 1.026 (ref 1.005–1.030)
Urobilinogen, UA: 1 mg/dL (ref 0.0–1.0)
Urobilinogen, UA: 1 mg/dL (ref 0.0–1.0)

## 2010-11-27 LAB — PROTIME-INR
INR: 1.39 (ref 0.00–1.49)
INR: 2.46 — ABNORMAL HIGH (ref 0.00–1.49)
INR: 3.7 — ABNORMAL HIGH (ref 0.00–1.49)
Prothrombin Time: 16.9 seconds — ABNORMAL HIGH (ref 11.6–15.2)
Prothrombin Time: 26.5 seconds — ABNORMAL HIGH (ref 11.6–15.2)

## 2010-11-27 LAB — BASIC METABOLIC PANEL
BUN: 23 mg/dL (ref 6–23)
CO2: 23 mEq/L (ref 19–32)
Calcium: 8.6 mg/dL (ref 8.4–10.5)
Calcium: 9.1 mg/dL (ref 8.4–10.5)
Chloride: 102 mEq/L (ref 96–112)
Chloride: 102 mEq/L (ref 96–112)
Creatinine, Ser: 0.84 mg/dL (ref 0.4–1.5)
Creatinine, Ser: 0.87 mg/dL (ref 0.4–1.5)
Creatinine, Ser: 1.31 mg/dL (ref 0.4–1.5)
GFR calc Af Amer: >60 mL/min (ref >60)
GFR calc Af Amer: >60 mL/min (ref >60)
GFR calc non Af Amer: 54 mL/min — ABNORMAL LOW (ref >60)
GFR calc non Af Amer: >60 mL/min (ref >60)
Glucose, Bld: 83 mg/dL (ref 70–99)
Potassium: 2.9 mEq/L — ABNORMAL LOW (ref 3.5–5.1)
Sodium: 143 mEq/L (ref 135–145)

## 2010-11-27 LAB — CBC
Hemoglobin: 14.5 g/dL (ref 13.0–17.0)
MCH: 30 pg (ref 26.0–34.0)
MCH: 30.5 pg (ref 26.0–34.0)
MCHC: 33.6 g/dL (ref 30.0–36.0)
MCHC: 34 g/dL (ref 30.0–36.0)
MCV: 90.1 fL (ref 78.0–100.0)
Platelets: 140 10*3/uL — ABNORMAL LOW (ref 150–400)
Platelets: 143 10*3/uL — ABNORMAL LOW (ref 150–400)
Platelets: 147 10*3/uL — ABNORMAL LOW (ref 150–400)
RBC: 4.17 MIL/uL — ABNORMAL LOW (ref 4.22–5.81)
RBC: 4.55 MIL/uL (ref 4.22–5.81)
RDW: 13.7 % (ref 11.5–15.5)
RDW: 13.7 % (ref 11.5–15.5)
WBC: 7.8 10*3/uL (ref 4.0–10.5)
WBC: 8.1 10*3/uL (ref 4.0–10.5)

## 2010-11-27 LAB — POCT I-STAT, CHEM 8
Chloride: 103 mEq/L (ref 96–112)
Glucose, Bld: 90 mg/dL (ref 70–99)
HCT: 44 % (ref 39.0–52.0)
Hemoglobin: 15 g/dL (ref 13.0–17.0)
Potassium: 3.4 mEq/L — ABNORMAL LOW (ref 3.5–5.1)
Sodium: 141 mEq/L (ref 135–145)

## 2010-11-27 LAB — DIFFERENTIAL
Basophils Absolute: 0 10*3/uL (ref 0.0–0.1)
Basophils Absolute: 0.1 10*3/uL (ref 0.0–0.1)
Basophils Relative: 0 % (ref 0–1)
Basophils Relative: 1 % (ref 0–1)
Eosinophils Absolute: 0.2 10*3/uL (ref 0.0–0.7)
Eosinophils Absolute: 0.2 10*3/uL (ref 0.0–0.7)
Monocytes Absolute: 0.9 10*3/uL (ref 0.1–1.0)
Monocytes Relative: 8 % (ref 3–12)
Neutro Abs: 7.1 10*3/uL (ref 1.7–7.7)
Neutro Abs: 7.5 10*3/uL (ref 1.7–7.7)
Neutrophils Relative %: 70 % (ref 43–77)
Neutrophils Relative %: 76 % (ref 43–77)

## 2010-11-27 LAB — HEPATIC FUNCTION PANEL
AST: 18 U/L (ref 0–37)
Bilirubin, Direct: 0.3 mg/dL (ref 0.0–0.3)
Total Bilirubin: 0.8 mg/dL (ref 0.3–1.2)

## 2010-11-27 LAB — POCT CARDIAC MARKERS
CKMB, poc: <1 ng/mL — ABNORMAL LOW (ref 1.0–8.0)
Myoglobin, poc: 115 ng/mL (ref 12–200)
Myoglobin, poc: 98.4 ng/mL (ref 12–200)
Troponin i, poc: <0.05 ng/mL (ref 0.00–0.09)

## 2010-11-27 LAB — TROPONIN I: Troponin I: 0.03 ng/mL (ref 0.00–0.06)

## 2010-11-27 LAB — MAGNESIUM: Magnesium: 1.9 mg/dL (ref 1.5–2.5)

## 2010-11-27 LAB — CK TOTAL AND CKMB (NOT AT ARMC)
Relative Index: INVALID (ref 0.0–2.5)
Total CK: 52 U/L (ref 7–232)

## 2010-11-27 LAB — TSH
TSH: 0.048 u[IU]/mL — ABNORMAL LOW (ref 0.350–4.500)
TSH: 0.057 u[IU]/mL — ABNORMAL LOW (ref 0.350–4.500)

## 2010-11-27 LAB — URINE MICROSCOPIC-ADD ON

## 2010-11-27 LAB — LIPASE, BLOOD: Lipase: 37 U/L (ref 11–59)

## 2010-11-27 LAB — APTT: aPTT: 56 seconds — ABNORMAL HIGH (ref 24–37)

## 2010-11-28 LAB — CARDIAC PANEL(CRET KIN+CKTOT+MB+TROPI)
Relative Index: 1.5 (ref 0.0–2.5)
Total CK: 137 U/L (ref 7–232)
Troponin I: 0.02 ng/mL (ref 0.00–0.06)

## 2010-11-28 LAB — BASIC METABOLIC PANEL
CO2: 28 mEq/L (ref 19–32)
Calcium: 8.8 mg/dL (ref 8.4–10.5)
Creatinine, Ser: 0.97 mg/dL (ref 0.4–1.5)
Glucose, Bld: 84 mg/dL (ref 70–99)

## 2010-11-28 LAB — TSH: TSH: 0.692 u[IU]/mL (ref 0.350–4.500)

## 2010-11-28 LAB — BRAIN NATRIURETIC PEPTIDE: Pro B Natriuretic peptide (BNP): 69 pg/mL (ref 0.0–100.0)

## 2010-11-28 LAB — PROTIME-INR: Prothrombin Time: 23.3 seconds — ABNORMAL HIGH (ref 11.6–15.2)

## 2010-11-28 LAB — LIPID PANEL
Total CHOL/HDL Ratio: 2.4 RATIO
VLDL: 17 mg/dL (ref 0–40)

## 2010-11-29 LAB — CK TOTAL AND CKMB (NOT AT ARMC): Relative Index: 1.1 (ref 0.0–2.5)

## 2010-11-29 LAB — CBC
HCT: 40.6 % (ref 39.0–52.0)
Hemoglobin: 14 g/dL (ref 13.0–17.0)
MCV: 88.3 fL (ref 78.0–100.0)
Platelets: 205 10*3/uL (ref 150–400)
WBC: 9.4 10*3/uL (ref 4.0–10.5)

## 2010-11-29 LAB — BASIC METABOLIC PANEL
BUN: 10 mg/dL (ref 6–23)
Chloride: 105 mEq/L (ref 96–112)
Glucose, Bld: 99 mg/dL (ref 70–99)
Potassium: 3.4 mEq/L — ABNORMAL LOW (ref 3.5–5.1)
Sodium: 142 mEq/L (ref 135–145)

## 2010-11-29 LAB — PROTIME-INR: INR: 2.45 — ABNORMAL HIGH (ref 0.00–1.49)

## 2010-11-30 NOTE — Letter (Signed)
Summary: Appointment - Missed  [facility], Main Office  [address]   [place], [place] [address]   Phone: [phone]  Fax: [phone]     November 23, 2010 MRN: [id number]   [name] [address] [place], [place]  [address]   Dear Mr. Valeri,  Our records indicate you missed your appointment on  11-01-2010  with Dr.  Graciela Husbands.                                    It is very important that we reach you to reschedule this appointment. We look forward to participating in your health care needs. Please contact us at the number listed above at your earliest convenience to reschedule this appointment.     Sincerely,       Lorne Skeens   [facility] Scheduling Team

## 2010-12-15 LAB — POCT CARDIAC MARKERS
CKMB, poc: 3.4 ng/mL (ref 1.0–8.0)
Myoglobin, poc: 60.5 ng/mL (ref 12–200)
Troponin i, poc: <0.05 ng/mL (ref 0.00–0.09)

## 2010-12-15 LAB — DIFFERENTIAL
Eosinophils Relative: 5 % (ref 0–5)
Lymphocytes Relative: 25 % (ref 12–46)
Lymphs Abs: 1.5 10*3/uL (ref 0.7–4.0)
Monocytes Relative: 10 % (ref 3–12)
Neutrophils Relative %: 59 % (ref 43–77)

## 2010-12-15 LAB — POCT I-STAT, CHEM 8
BUN: 13 mg/dL (ref 6–23)
Chloride: 101 mEq/L (ref 96–112)
HCT: 40 % (ref 39.0–52.0)
Potassium: 3.4 mEq/L — ABNORMAL LOW (ref 3.5–5.1)

## 2010-12-15 LAB — PROTIME-INR
INR: 2.96 — ABNORMAL HIGH (ref 0.00–1.49)
Prothrombin Time: 30.6 seconds — ABNORMAL HIGH (ref 11.6–15.2)
Prothrombin Time: 26.9 seconds — ABNORMAL HIGH (ref 11.6–15.2)

## 2010-12-15 LAB — CBC
HCT: 39.3 % (ref 39.0–52.0)
MCV: 88.6 fL (ref 78.0–100.0)
Platelets: 285 10*3/uL (ref 150–400)
RBC: 4.43 MIL/uL (ref 4.22–5.81)
WBC: 6 10*3/uL (ref 4.0–10.5)

## 2010-12-16 LAB — CK TOTAL AND CKMB (NOT AT ARMC)
CK, MB: 1.6 ng/mL (ref 0.3–4.0)
CK, MB: 2.6 ng/mL (ref 0.3–4.0)
Relative Index: 1.2 (ref 0.0–2.5)
Relative Index: 2.6 — ABNORMAL HIGH (ref 0.0–2.5)
Total CK: 101 U/L (ref 7–232)
Total CK: 133 U/L (ref 7–232)

## 2010-12-16 LAB — BASIC METABOLIC PANEL
BUN: 14 mg/dL (ref 6–23)
BUN: 17 mg/dL (ref 6–23)
BUN: 22 mg/dL (ref 6–23)
CO2: 26 mEq/L (ref 19–32)
CO2: 27 mEq/L (ref 19–32)
Calcium: 8.9 mg/dL (ref 8.4–10.5)
Calcium: 9.1 mg/dL (ref 8.4–10.5)
Calcium: 9.3 mg/dL (ref 8.4–10.5)
Chloride: 103 mEq/L (ref 96–112)
Chloride: 99 mEq/L (ref 96–112)
Creatinine, Ser: 1.14 mg/dL (ref 0.4–1.5)
GFR calc Af Amer: >60 mL/min (ref >60)
GFR calc non Af Amer: >60 mL/min (ref >60)
GFR calc non Af Amer: >60 mL/min (ref >60)
GFR calc non Af Amer: >60 mL/min (ref >60)
Glucose, Bld: 112 mg/dL — ABNORMAL HIGH (ref 70–99)
Glucose, Bld: 163 mg/dL — ABNORMAL HIGH (ref 70–99)
Glucose, Bld: 175 mg/dL — ABNORMAL HIGH (ref 70–99)
Glucose, Bld: 179 mg/dL — ABNORMAL HIGH (ref 70–99)
Potassium: 4.2 mEq/L (ref 3.5–5.1)
Potassium: 4.4 mEq/L (ref 3.5–5.1)
Sodium: 135 mEq/L (ref 135–145)
Sodium: 137 mEq/L (ref 135–145)
Sodium: 140 mEq/L (ref 135–145)

## 2010-12-16 LAB — LIPID PANEL
Triglycerides: 94 mg/dL (ref <150)
VLDL: 19 mg/dL (ref 0–40)

## 2010-12-16 LAB — DIFFERENTIAL
Basophils Absolute: 0 10*3/uL (ref 0.0–0.1)
Eosinophils Absolute: 0 10*3/uL (ref 0.0–0.7)
Lymphocytes Relative: 7 % — ABNORMAL LOW (ref 12–46)
Neutro Abs: 20 10*3/uL — ABNORMAL HIGH (ref 1.7–7.7)
Neutrophils Relative %: 87 % — ABNORMAL HIGH (ref 43–77)

## 2010-12-16 LAB — POCT I-STAT 3, ART BLOOD GAS (G3+)
Acid-Base Excess: 4 mmol/L — ABNORMAL HIGH (ref 0.0–2.0)
Bicarbonate: 28.6 mEq/L — ABNORMAL HIGH (ref 20.0–24.0)
Patient temperature: 100.8

## 2010-12-16 LAB — CULTURE, BLOOD (ROUTINE X 2)

## 2010-12-16 LAB — CBC
HCT: 37 % — ABNORMAL LOW (ref 39.0–52.0)
Hemoglobin: 11.6 g/dL — ABNORMAL LOW (ref 13.0–17.0)
Hemoglobin: 12 g/dL — ABNORMAL LOW (ref 13.0–17.0)
Hemoglobin: 12.7 g/dL — ABNORMAL LOW (ref 13.0–17.0)
Hemoglobin: 13.2 g/dL (ref 13.0–17.0)
Hemoglobin: 13.4 g/dL (ref 13.0–17.0)
MCHC: 33.8 g/dL (ref 30.0–36.0)
MCHC: 34.2 g/dL (ref 30.0–36.0)
MCHC: 34.2 g/dL (ref 30.0–36.0)
MCV: 90.2 fL (ref 78.0–100.0)
MCV: 90.4 fL (ref 78.0–100.0)
Platelets: 197 10*3/uL (ref 150–400)
Platelets: 207 10*3/uL (ref 150–400)
Platelets: 218 10*3/uL (ref 150–400)
RBC: 3.94 MIL/uL — ABNORMAL LOW (ref 4.22–5.81)
RBC: 4.28 MIL/uL (ref 4.22–5.81)
RDW: 13.1 % (ref 11.5–15.5)
RDW: 13.2 % (ref 11.5–15.5)
RDW: 13.4 % (ref 11.5–15.5)
WBC: 7 10*3/uL (ref 4.0–10.5)

## 2010-12-16 LAB — POCT CARDIAC MARKERS
CKMB, poc: <1 ng/mL — ABNORMAL LOW (ref 1.0–8.0)
Myoglobin, poc: 148 ng/mL (ref 12–200)
Troponin i, poc: <0.05 ng/mL (ref 0.00–0.09)

## 2010-12-16 LAB — PROTIME-INR
INR: 1.27 (ref 0.00–1.49)
INR: 1.81 — ABNORMAL HIGH (ref 0.00–1.49)
INR: 2.48 — ABNORMAL HIGH (ref 0.00–1.49)
Prothrombin Time: 14.9 seconds (ref 11.6–15.2)
Prothrombin Time: 20.8 seconds — ABNORMAL HIGH (ref 11.6–15.2)

## 2010-12-16 LAB — BRAIN NATRIURETIC PEPTIDE: Pro B Natriuretic peptide (BNP): 58 pg/mL (ref 0.0–100.0)

## 2010-12-16 LAB — TROPONIN I: Troponin I: 0.01 ng/mL (ref 0.00–0.06)

## 2010-12-19 LAB — DIFFERENTIAL
Basophils Relative: 1 % (ref 0–1)
Eosinophils Absolute: 2.2 10*3/uL — ABNORMAL HIGH (ref 0.0–0.7)
Eosinophils Relative: 22 % — ABNORMAL HIGH (ref 0–5)
Monocytes Relative: 7 % (ref 3–12)
Neutrophils Relative %: 48 % (ref 43–77)

## 2010-12-19 LAB — CBC
HCT: 36.2 % — ABNORMAL LOW (ref 39.0–52.0)
Hemoglobin: 12.4 g/dL — ABNORMAL LOW (ref 13.0–17.0)
Hemoglobin: 14.9 g/dL (ref 13.0–17.0)
MCHC: 33.9 g/dL (ref 30.0–36.0)
MCHC: 34.3 g/dL (ref 30.0–36.0)
MCV: 87.4 fL (ref 78.0–100.0)
MCV: 87.9 fL (ref 78.0–100.0)
Platelets: 175 10*3/uL (ref 150–400)
RBC: 4.14 MIL/uL — ABNORMAL LOW (ref 4.22–5.81)
RBC: 5.05 MIL/uL (ref 4.22–5.81)
RDW: 13.7 % (ref 11.5–15.5)
WBC: 10.2 10*3/uL (ref 4.0–10.5)

## 2010-12-19 LAB — COMPREHENSIVE METABOLIC PANEL
ALT: 12 U/L (ref 0–53)
AST: 21 U/L (ref 0–37)
Alkaline Phosphatase: 80 U/L (ref 39–117)
CO2: 32 mEq/L (ref 19–32)
GFR calc Af Amer: >60 mL/min (ref >60)
Glucose, Bld: 103 mg/dL — ABNORMAL HIGH (ref 70–99)
Potassium: 4 mEq/L (ref 3.5–5.1)
Sodium: 142 mEq/L (ref 135–145)
Total Protein: 7.3 g/dL (ref 6.0–8.3)

## 2010-12-19 LAB — BRAIN NATRIURETIC PEPTIDE: Pro B Natriuretic peptide (BNP): <30 pg/mL (ref 0.0–100.0)

## 2010-12-19 LAB — CARDIAC PANEL(CRET KIN+CKTOT+MB+TROPI)
Relative Index: INVALID (ref 0.0–2.5)
Relative Index: INVALID (ref 0.0–2.5)
Total CK: 66 U/L (ref 7–232)
Total CK: 76 U/L (ref 7–232)
Troponin I: 0.01 ng/mL (ref 0.00–0.06)
Troponin I: 0.01 ng/mL (ref 0.00–0.06)

## 2010-12-19 LAB — EXPECTORATED SPUTUM ASSESSMENT W GRAM STAIN, RFLX TO RESP C

## 2010-12-19 LAB — BASIC METABOLIC PANEL
BUN: 18 mg/dL (ref 6–23)
CO2: 28 mEq/L (ref 19–32)
CO2: 30 mEq/L (ref 19–32)
Calcium: 9 mg/dL (ref 8.4–10.5)
Calcium: 9.6 mg/dL (ref 8.4–10.5)
Chloride: 101 mEq/L (ref 96–112)
Chloride: 103 mEq/L (ref 96–112)
Creatinine, Ser: 1.01 mg/dL (ref 0.4–1.5)
Creatinine, Ser: 1.11 mg/dL (ref 0.4–1.5)
GFR calc Af Amer: >60 mL/min (ref >60)
GFR calc Af Amer: >60 mL/min (ref >60)
GFR calc non Af Amer: >60 mL/min (ref >60)
Glucose, Bld: 166 mg/dL — ABNORMAL HIGH (ref 70–99)
Potassium: 4.5 mEq/L (ref 3.5–5.1)
Sodium: 135 mEq/L (ref 135–145)
Sodium: 136 mEq/L (ref 135–145)

## 2010-12-19 LAB — GLUCOSE, CAPILLARY
Glucose-Capillary: 148 mg/dL — ABNORMAL HIGH (ref 70–99)
Glucose-Capillary: 152 mg/dL — ABNORMAL HIGH (ref 70–99)
Glucose-Capillary: 158 mg/dL — ABNORMAL HIGH (ref 70–99)
Glucose-Capillary: 173 mg/dL — ABNORMAL HIGH (ref 70–99)
Glucose-Capillary: 186 mg/dL — ABNORMAL HIGH (ref 70–99)

## 2010-12-19 LAB — CULTURE, RESPIRATORY W GRAM STAIN

## 2010-12-19 LAB — CK TOTAL AND CKMB (NOT AT ARMC)
CK, MB: 0.9 ng/mL (ref 0.3–4.0)
Relative Index: INVALID (ref 0.0–2.5)
Total CK: 63 U/L (ref 7–232)

## 2010-12-19 LAB — TSH: TSH: 0.42 u[IU]/mL (ref 0.350–4.500)

## 2010-12-19 LAB — POCT CARDIAC MARKERS: Myoglobin, poc: 63.1 ng/mL (ref 12–200)

## 2010-12-19 LAB — PROTIME-INR
INR: 2.6 — ABNORMAL HIGH (ref 0.00–1.49)
Prothrombin Time: 30.8 seconds — ABNORMAL HIGH (ref 11.6–15.2)

## 2010-12-19 LAB — TROPONIN I: Troponin I: 0.06 ng/mL (ref 0.00–0.06)

## 2010-12-20 LAB — CARDIAC PANEL(CRET KIN+CKTOT+MB+TROPI)
CK, MB: 1.2 ng/mL (ref 0.3–4.0)
CK, MB: 1.3 ng/mL (ref 0.3–4.0)
Relative Index: INVALID (ref 0.0–2.5)
Troponin I: 0.01 ng/mL (ref 0.00–0.06)
Troponin I: 0.01 ng/mL (ref 0.00–0.06)

## 2010-12-20 LAB — PROTIME-INR: INR: 2.6 — ABNORMAL HIGH (ref 0.00–1.49)

## 2010-12-20 LAB — DIFFERENTIAL
Lymphocytes Relative: 31 % (ref 12–46)
Monocytes Absolute: 0.7 10*3/uL (ref 0.1–1.0)
Monocytes Relative: 9 % (ref 3–12)
Neutro Abs: 4.1 10*3/uL (ref 1.7–7.7)
Neutrophils Relative %: 53 % (ref 43–77)

## 2010-12-20 LAB — BASIC METABOLIC PANEL
BUN: 11 mg/dL (ref 6–23)
CO2: 29 mEq/L (ref 19–32)
Chloride: 106 mEq/L (ref 96–112)
Creatinine, Ser: 0.84 mg/dL (ref 0.4–1.5)
GFR calc Af Amer: >60 mL/min (ref >60)
Potassium: 3.4 mEq/L — ABNORMAL LOW (ref 3.5–5.1)

## 2010-12-20 LAB — BRAIN NATRIURETIC PEPTIDE
Pro B Natriuretic peptide (BNP): 46 pg/mL (ref 0.0–100.0)
Pro B Natriuretic peptide (BNP): <30 pg/mL (ref 0.0–100.0)

## 2010-12-20 LAB — CK TOTAL AND CKMB (NOT AT ARMC): CK, MB: 0.9 ng/mL (ref 0.3–4.0)

## 2010-12-20 LAB — TROPONIN I: Troponin I: 0.02 ng/mL (ref 0.00–0.06)

## 2010-12-20 LAB — TSH: TSH: 0.578 u[IU]/mL (ref 0.350–4.500)

## 2010-12-20 LAB — LIPID PANEL
Cholesterol: 126 mg/dL (ref 0–200)
HDL: 36 mg/dL — ABNORMAL LOW (ref >39)

## 2010-12-20 LAB — POCT CARDIAC MARKERS
CKMB, poc: <1 ng/mL — ABNORMAL LOW (ref 1.0–8.0)
Myoglobin, poc: 65.5 ng/mL (ref 12–200)
Troponin i, poc: <0.05 ng/mL (ref 0.00–0.09)

## 2010-12-20 LAB — CBC
Hemoglobin: 12.6 g/dL — ABNORMAL LOW (ref 13.0–17.0)
RBC: 4.23 MIL/uL (ref 4.22–5.81)
WBC: 7.9 10*3/uL (ref 4.0–10.5)

## 2010-12-20 LAB — HEMOGLOBIN A1C: Hgb A1c MFr Bld: 5.4 % (ref 4.6–6.1)

## 2010-12-20 LAB — APTT: aPTT: 39 seconds — ABNORMAL HIGH (ref 24–37)

## 2010-12-21 LAB — POCT I-STAT, CHEM 8
BUN: 7 mg/dL (ref 6–23)
Calcium, Ion: 1.14 mmol/L (ref 1.12–1.32)
Creatinine, Ser: 1.2 mg/dL (ref 0.4–1.5)
Glucose, Bld: 89 mg/dL (ref 70–99)
Hemoglobin: 14.3 g/dL (ref 13.0–17.0)
TCO2: 32 mmol/L (ref 0–100)

## 2010-12-21 LAB — CBC
HCT: 40.9 % (ref 39.0–52.0)
Hemoglobin: 13.6 g/dL (ref 13.0–17.0)
MCHC: 33.2 g/dL (ref 30.0–36.0)
MCV: 87.4 fL (ref 78.0–100.0)
RBC: 4.68 MIL/uL (ref 4.22–5.81)
WBC: 9.4 10*3/uL (ref 4.0–10.5)

## 2010-12-21 LAB — POCT CARDIAC MARKERS: Myoglobin, poc: 91 ng/mL (ref 12–200)

## 2010-12-23 LAB — HEMOGLOBIN A1C: Mean Plasma Glucose: 114 mg/dL

## 2010-12-23 LAB — BASIC METABOLIC PANEL
BUN: 18 mg/dL (ref 6–23)
BUN: 19 mg/dL (ref 6–23)
CO2: 28 mEq/L (ref 19–32)
CO2: 32 mEq/L (ref 19–32)
Calcium: 8.7 mg/dL (ref 8.4–10.5)
Calcium: 8.9 mg/dL (ref 8.4–10.5)
Calcium: 9.3 mg/dL (ref 8.4–10.5)
Chloride: 106 mEq/L (ref 96–112)
Chloride: 106 mEq/L (ref 96–112)
Chloride: 97 mEq/L (ref 96–112)
Creatinine, Ser: 0.87 mg/dL (ref 0.4–1.5)
Creatinine, Ser: 0.95 mg/dL (ref 0.4–1.5)
Creatinine, Ser: 1.3 mg/dL (ref 0.4–1.5)
GFR calc Af Amer: >60 mL/min (ref >60)
GFR calc Af Amer: >60 mL/min (ref >60)
GFR calc non Af Amer: 54 mL/min — ABNORMAL LOW (ref >60)
Glucose, Bld: 177 mg/dL — ABNORMAL HIGH (ref 70–99)
Potassium: 2.9 mEq/L — ABNORMAL LOW (ref 3.5–5.1)
Sodium: 138 mEq/L (ref 135–145)
Sodium: 141 mEq/L (ref 135–145)

## 2010-12-23 LAB — CBC
HCT: 40.2 % (ref 39.0–52.0)
Hemoglobin: 12.3 g/dL — ABNORMAL LOW (ref 13.0–17.0)
Hemoglobin: 13.6 g/dL (ref 13.0–17.0)
MCHC: 33.7 g/dL (ref 30.0–36.0)
MCHC: 33.8 g/dL (ref 30.0–36.0)
MCV: 88.6 fL (ref 78.0–100.0)
MCV: 89.3 fL (ref 78.0–100.0)
Platelets: 193 10*3/uL (ref 150–400)
Platelets: 203 10*3/uL (ref 150–400)
Platelets: 206 10*3/uL (ref 150–400)
RBC: 4.05 MIL/uL — ABNORMAL LOW (ref 4.22–5.81)
RBC: 4.12 MIL/uL — ABNORMAL LOW (ref 4.22–5.81)
RDW: 12.9 % (ref 11.5–15.5)
RDW: 13.5 % (ref 11.5–15.5)
WBC: 5.1 10*3/uL (ref 4.0–10.5)
WBC: 9.4 10*3/uL (ref 4.0–10.5)

## 2010-12-23 LAB — GLUCOSE, CAPILLARY
Glucose-Capillary: 133 mg/dL — ABNORMAL HIGH (ref 70–99)
Glucose-Capillary: 147 mg/dL — ABNORMAL HIGH (ref 70–99)
Glucose-Capillary: 147 mg/dL — ABNORMAL HIGH (ref 70–99)
Glucose-Capillary: 159 mg/dL — ABNORMAL HIGH (ref 70–99)
Glucose-Capillary: 180 mg/dL — ABNORMAL HIGH (ref 70–99)
Glucose-Capillary: 194 mg/dL — ABNORMAL HIGH (ref 70–99)
Glucose-Capillary: 195 mg/dL — ABNORMAL HIGH (ref 70–99)
Glucose-Capillary: 227 mg/dL — ABNORMAL HIGH (ref 70–99)

## 2010-12-23 LAB — PROTIME-INR
INR: 2 — ABNORMAL HIGH (ref 0.00–1.49)
INR: 2.4 — ABNORMAL HIGH (ref 0.00–1.49)
INR: 3 — ABNORMAL HIGH (ref 0.00–1.49)
INR: 3.7 — ABNORMAL HIGH (ref 0.00–1.49)
Prothrombin Time: 23.7 seconds — ABNORMAL HIGH (ref 11.6–15.2)
Prothrombin Time: 27.8 seconds — ABNORMAL HIGH (ref 11.6–15.2)
Prothrombin Time: 28.6 seconds — ABNORMAL HIGH (ref 11.6–15.2)
Prothrombin Time: 33.4 seconds — ABNORMAL HIGH (ref 11.6–15.2)

## 2010-12-23 LAB — BLOOD GAS, ARTERIAL
Bicarbonate: 30.6 mEq/L — ABNORMAL HIGH (ref 20.0–24.0)
O2 Saturation: 94.9 %
Patient temperature: 98.6

## 2010-12-23 LAB — BRAIN NATRIURETIC PEPTIDE: Pro B Natriuretic peptide (BNP): <30 pg/mL (ref 0.0–100.0)

## 2010-12-23 LAB — DIFFERENTIAL
Basophils Absolute: 0 10*3/uL (ref 0.0–0.1)
Basophils Relative: 0 % (ref 0–1)
Eosinophils Relative: 6 % — ABNORMAL HIGH (ref 0–5)
Monocytes Absolute: 0.7 10*3/uL (ref 0.1–1.0)
Neutro Abs: 6.4 10*3/uL (ref 1.7–7.7)

## 2010-12-23 LAB — D-DIMER, QUANTITATIVE: D-Dimer, Quant: <0.22 ug/mL-FEU (ref 0.00–0.48)

## 2010-12-23 LAB — T4, FREE: Free T4: 1.3 ng/dL (ref 0.89–1.80)

## 2010-12-27 LAB — POCT CARDIAC MARKERS
CKMB, poc: 1.1 ng/mL (ref 1.0–8.0)
Myoglobin, poc: 73.9 ng/mL (ref 12–200)
Troponin i, poc: <0.05 ng/mL (ref 0.00–0.09)

## 2010-12-27 LAB — CK TOTAL AND CKMB (NOT AT ARMC)
CK, MB: 1.5 ng/mL (ref 0.3–4.0)
Relative Index: INVALID (ref 0.0–2.5)
Total CK: 58 U/L (ref 7–232)

## 2010-12-27 LAB — HEPATIC FUNCTION PANEL
ALT: 25 U/L (ref 0–53)
AST: 25 U/L (ref 0–37)
Albumin: 3.7 g/dL (ref 3.5–5.2)
Alkaline Phosphatase: 59 U/L (ref 39–117)
Bilirubin, Direct: 0.3 mg/dL (ref 0.0–0.3)
Indirect Bilirubin: 0.7 mg/dL (ref 0.3–0.9)
Total Bilirubin: 1 mg/dL (ref 0.3–1.2)
Total Protein: 6.5 g/dL (ref 6.0–8.3)

## 2010-12-27 LAB — POCT I-STAT, CHEM 8
BUN: 19 mg/dL (ref 6–23)
Calcium, Ion: 1.19 mmol/L (ref 1.12–1.32)
Chloride: 100 meq/L (ref 96–112)
Creatinine, Ser: 1.1 mg/dL (ref 0.4–1.5)
Glucose, Bld: 96 mg/dL (ref 70–99)
HCT: 43 % (ref 39.0–52.0)
Hemoglobin: 14.6 g/dL (ref 13.0–17.0)
Potassium: 4.1 mEq/L (ref 3.5–5.1)
Sodium: 138 mEq/L (ref 135–145)
TCO2: 28 mmol/L (ref 0–100)

## 2010-12-27 LAB — ABO/RH: ABO/RH(D): O POS

## 2010-12-27 LAB — COMPREHENSIVE METABOLIC PANEL
ALT: 18 U/L (ref 0–53)
Albumin: 3.3 g/dL — ABNORMAL LOW (ref 3.5–5.2)
Alkaline Phosphatase: 58 U/L (ref 39–117)
GFR calc Af Amer: >60 mL/min (ref >60)
Potassium: 4.1 mEq/L (ref 3.5–5.1)
Sodium: 137 mEq/L (ref 135–145)
Total Protein: 6.2 g/dL (ref 6.0–8.3)

## 2010-12-27 LAB — TSH
TSH: 0.706 u[IU]/mL (ref 0.350–4.500)
TSH: 0.869 u[IU]/mL (ref 0.350–4.500)

## 2010-12-27 LAB — CBC
HCT: 41.5 % (ref 39.0–52.0)
Hemoglobin: 14.2 g/dL (ref 13.0–17.0)
MCHC: 34.2 g/dL (ref 30.0–36.0)
MCV: 88.7 fL (ref 78.0–100.0)
Platelets: 253 10*3/uL (ref 150–400)
Platelets: 259 10*3/uL (ref 150–400)
RBC: 4.69 MIL/uL (ref 4.22–5.81)
RDW: 13.7 % (ref 11.5–15.5)
RDW: 13.8 % (ref 11.5–15.5)
WBC: 7.5 10*3/uL (ref 4.0–10.5)

## 2010-12-27 LAB — CARDIAC PANEL(CRET KIN+CKTOT+MB+TROPI)
CK, MB: 1.3 ng/mL (ref 0.3–4.0)
CK, MB: 1.3 ng/mL (ref 0.3–4.0)
Relative Index: INVALID (ref 0.0–2.5)
Total CK: 44 U/L (ref 7–232)
Total CK: 47 U/L (ref 7–232)
Troponin I: <0.01 ng/mL (ref 0.00–0.06)
Troponin I: <0.01 ng/mL (ref 0.00–0.06)

## 2010-12-27 LAB — DIFFERENTIAL
Eosinophils Relative: 4 % (ref 0–5)
Lymphocytes Relative: 25 % (ref 12–46)
Lymphs Abs: 1.8 10*3/uL (ref 0.7–4.0)
Monocytes Absolute: 0.8 10*3/uL (ref 0.1–1.0)
Monocytes Relative: 11 % (ref 3–12)

## 2010-12-27 LAB — TROPONIN I: Troponin I: 0.01 ng/mL (ref 0.00–0.06)

## 2010-12-27 LAB — BRAIN NATRIURETIC PEPTIDE
Pro B Natriuretic peptide (BNP): <30 pg/mL (ref 0.0–100.0)
Pro B Natriuretic peptide (BNP): <30 pg/mL (ref 0.0–100.0)

## 2010-12-27 LAB — TYPE AND SCREEN: Antibody Screen: NEGATIVE

## 2011-01-01 ENCOUNTER — Other Ambulatory Visit: Payer: Self-pay | Admitting: *Deleted

## 2011-01-01 MED ORDER — DILTIAZEM HCL ER COATED BEADS 120 MG PO CP24: 120.0000 mg | ORAL_CAPSULE | Freq: Every day | ORAL | Status: DC

## 2011-01-25 ENCOUNTER — Emergency Department (HOSPITAL_COMMUNITY): Payer: Medicare Other

## 2011-01-25 ENCOUNTER — Emergency Department (HOSPITAL_COMMUNITY)
Admission: EM | Admit: 2011-01-25 | Discharge: 2011-01-25 | Disposition: A | Discharge status: Home / Self Care | Payer: Medicare Other | Attending: Emergency Medicine | Admitting: Emergency Medicine

## 2011-01-25 DIAGNOSIS — Z8673 Personal history of transient ischemic attack (TIA), and cerebral infarction without residual deficits: Secondary | ICD-10-CM | POA: Insufficient documentation

## 2011-01-25 DIAGNOSIS — J4489 Other specified chronic obstructive pulmonary disease: Secondary | ICD-10-CM | POA: Insufficient documentation

## 2011-01-25 DIAGNOSIS — I4891 Unspecified atrial fibrillation: Secondary | ICD-10-CM | POA: Insufficient documentation

## 2011-01-25 DIAGNOSIS — Z7901 Long term (current) use of anticoagulants: Secondary | ICD-10-CM | POA: Insufficient documentation

## 2011-01-25 DIAGNOSIS — R0602 Shortness of breath: Secondary | ICD-10-CM | POA: Insufficient documentation

## 2011-01-25 DIAGNOSIS — I440 Atrioventricular block, first degree: Secondary | ICD-10-CM | POA: Insufficient documentation

## 2011-01-25 DIAGNOSIS — J449 Chronic obstructive pulmonary disease, unspecified: Secondary | ICD-10-CM | POA: Insufficient documentation

## 2011-01-25 LAB — DIFFERENTIAL
Basophils Absolute: 0 10*3/uL (ref 0.0–0.1)
Eosinophils Absolute: 0.4 10*3/uL (ref 0.0–0.7)
Eosinophils Relative: 5 % (ref 0–5)
Lymphs Abs: 2.1 10*3/uL (ref 0.7–4.0)
Monocytes Absolute: 0.6 10*3/uL (ref 0.1–1.0)

## 2011-01-25 LAB — PROTIME-INR
INR: 1.75 — ABNORMAL HIGH (ref 0.00–1.49)
Prothrombin Time: 20.6 seconds — ABNORMAL HIGH (ref 11.6–15.2)

## 2011-01-25 LAB — CBC
HCT: 44.3 % (ref 39.0–52.0)
Hemoglobin: 15.2 g/dL (ref 13.0–17.0)
MCHC: 34.3 g/dL (ref 30.0–36.0)
MCV: 86 fL (ref 78.0–100.0)
RDW: 12.4 % (ref 11.5–15.5)

## 2011-01-25 LAB — POCT CARDIAC MARKERS: Myoglobin, poc: 69.7 ng/mL (ref 12–200)

## 2011-01-25 LAB — APTT: aPTT: 36 seconds (ref 24–37)

## 2011-01-25 NOTE — Assessment & Plan Note (Signed)
[facility]                         ELECTROPHYSIOLOGY OFFICE NOTE   NAME:SORRELLDaemian, Gahm                    MRN:          [id number]  DATE:02/13/2008                            DOB:          [birth year]    Mr. Thrun is seen following admission for pneumonia at which time he  was found to be in atrial fibrillation which was refractory to  cardioversion for which he underwent flecainide therapy.  He is feeling  much better.  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  He has no  known coronary artery disease.  He did not undergo Myoview scanning in  hospital.   He has also had some bradycardia.   CURRENT MEDICATIONS:  Include:  1. Coumadin.  2. Lasix.  3. Advair.  4. Flecainide 100 b.i.d.  5. Zocor.   His thromboembolic risk factors are notable for hypertension and  congestive heart failure.     On examination, his blood pressure was 126/60, his pulse was 74.  His lungs were clear.  HEART:  Sounds were regular.  EXTREMITIES:  Without edema.   Electrocardiogram dated today demonstrated sinus rhythm at 74 with  intervals of 0.23/1.11/0.41.  The axis was 52 degrees.   IMPRESSION:  1. Paroxysmal atrial fibrillation.  2. Flecainide therapy for #1.  3. First-degree AV block, likely related #2.  4. Thromboembolic risk factors notable for:      a.     Diastolic heart failure.      b.     Hypertension.   Mr. Rigg is doing really very well.  We will plan to undertake  Myoview scanning to make sure that he is not an excessive risk related  to his once a day therapy, and we will see him again in three months'  time.     Duke Salvia, MD, [facility]  Electronically Signed    SCK/MedQ  DD: 02/13/2008  DT: 02/13/2008  Job #: [id number]   cc:   Lindaann Pascal, PA-C

## 2011-01-25 NOTE — H&P (Signed)
[name], [name] NO.:  [id number]   MEDICAL RECORD NO.:  [id number]          PATIENT TYPE:  OBV   LOCATION:  2030                         FACILITY:  [facility]   PHYSICIAN:  Bevelyn Buckles. Bensimhon, MDDATE OF BIRTH:  01/05/1935   DATE OF ADMISSION:  09/22/2008  DATE OF DISCHARGE:                              HISTORY & PHYSICAL   PRIMARY CARDIOLOGIST:  Duke Salvia, MD, [facility]   PRIMARY CARE PHYSICIAN:  Paulita Cradle, NP, [facility]   HISTORY OF PRESENT ILLNESS:  [name] is a 76 year old Caucasian  gentleman with a past medical history of paroxysmal atrial fib,  currently on flecainide therapy and Coumadin therapy, followed in the  Coumadin Clinic at [facility], and also has a history of nonobstructive CAD  by cath in July 2009.  [name] was in his usual state of health  until Thursday.  He apparently was throwing some old tires into his  truck, around 100-125 tires when he became dizzy, had tunnel vision and  syncopal episode.  He states this has happened one time previously.  He  is somewhat poor historian, but on further discussion with him  apparently both of these episodes have occurred when he forgot to take  his flecainide.  He apparently called our Coumadin Clinic office this  morning because he was coughing up some blood-tinged sputum mentioning  that he had been dizzy and having some chest discomfort and passed out  on Thursday.  He was told to hang up the phone and call 911 (I know this  because I spoke with Shelby Dubin in the Coumadin Clinic (this was around  9 o'clock this morning).  The patient, however, had his grandson drive  him here to the ER for further evaluation.  Last INR was 2.0 and that  was on September 01, 2008.  Also, note the patient is not always  compliant in taking his Coumadin as requested.  He states if he feels  his blood is too thin, he sometimes will hold his Coumadin.  Chest  discomfort, the patient  states is not the main reason he is here.  Chest  discomfort is not associated with any activity or position.  He states  it is just a dull agonizing pain that runs across his chest.  Further  evaluation is actually in the right and left upper quadrant, it is not  in his chest area.  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  He does never have any of his chest  discomfort when he is doing physical exertion.  Here in the ER, he was  treated with Zofran and aspirin, did not receive any nitroglycerin.  Has  not had any chest comfort while here.  Has been up to the bathroom  without getting lightheaded or dizzy.  Has not had any productive cough  while here in the ER.   PAST MEDICAL HISTORY:  1. COPD and extensive history of tobacco use since age 32.  He quit      approximately  5 years ago.  O2 dependent.  The patient states he      wears 3 L p.r.n. but always sleeps in 1.5 L at night.  2. Hypertension.  3. Paroxysmal atrial fib status post direct current      cardioversion/flecainide/Coumadin.  4. Coronary artery disease, nonobstructive cardiac catheterization,      July 2009.  5. Medical noncompliance.  6. GERD.  7. Dyslipidemia.  8. Lacunar CVA/TIAs.  9. Congestive heart failure secondary to diastolic dysfunction, normal      EF.   SOCIAL HISTORY:  The patient lives in [place] alone.  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  He has 5 children.  Quit using tobacco 5 years  ago.  Denies any drugs, herbal medication, or alcohol use.  Has a  regular diet.   FAMILY HISTORY:  Mother deceased secondary to brain aneurysm.  Father  deceased secondary to MI and COPD, died in his 29s.  Siblings, positive  for cancer/brain tumor.   REVIEW OF SYSTEMS:  Positive for headaches, occasionally chest pain,  dyspnea on exertion, cough with marked syncopal episode, and dizziness.   ALLERGIES:  No known drug allergies.   CURRENT MEDICATIONS:  1.  Nexium.  2. Combivent.  3. Zocor 40.  4. Flecainide 100 b.i.d.  5. Lasix 40 b.i.d.  6. Aspirin 81.  7. Lisinopril 10.  8. Mucinex 600 b.i.d.  9. Coumadin.  10.Singulair 10.   PHYSICAL EXAMINATION:  VITAL SIGNS:  Temperature 97, heart rate 76-86,  respirations 18, blood pressure 153/100 to 135/79.  GENERAL:  No acute distress.  HEENT:  Unremarkable.  Poor dentition.  NECK:  Supple without lymphadenopathy, bruits, or JVD.  CARDIOVASCULAR:  S1 and S2.  Regular rate and rhythm.  LUNGS:  He has scattered rhonchi, bilateral bases.  SKIN:  He has several ecchymotic areas noted to bilateral forearms.  ABDOMEN:  Soft and nontender.  Positive bowel sounds.  LOWER EXTREMITIES:  No clubbing, cyanosis, or edema.  NEUROLOGIC:  Alert and oriented x3.   DIAGNOSTIC DATA:  Chest x-ray shows no acute findings.  CT of the head  negative.  EKG:  Sinus rhythm, rate of 73.   LABORATORY DATA:  WBC 7.5, H and H 14.2 and 41.5 respectively, and  platelets 259.  Sodium 138, potassium 4.1, creatinine 1.1, and glucose  96.  AST 25 and lipase 25.  Point of care negative x1 set.  INR 3.1.   IMPRESSION:  1. Syncopal episode x2 events, both have occurred in the setting of      the patient holding his flecainide, most recent episode last      Thursday.  2. Paroxysmal atrial fibrillation.  The patient has maintained normal      sinus rhythm while here on monitor in the emergency room.  He has a      supratherapeutic INR at this time.  3. Dizziness, most likely secondary to syncopal episode.  4. Medical noncompliance.  5. Chest discomfort, rather atypical on clinical findings.   The patient will be admitted for observation, cycle enzymes, check 2-D  echocardiogram.  If enzymes negative and echo without acute findings, we  will plan on discharging the patient home to followup with Dr. Graciela Husbands and  arrange for outpatient event monitor.  Dr. Arvilla Meres is in to  examine and assess the patient and agrees  with plan of care.      Dorian Pod, ACNP      Bevelyn Buckles. Bensimhon, MD  Electronically  Signed    MB/MEDQ  D:  09/22/2008  T:  09/23/2008  Job:  [id number]

## 2011-01-25 NOTE — Discharge Summary (Signed)
NAMEAXEL, FRISK NO.:  [id number]   MEDICAL RECORD NO.:  [id number]          PATIENT TYPE:  INP   LOCATION:  2031                         FACILITY:  [facility]   PHYSICIAN:  Elliot Cousin, M.D.    DATE OF BIRTH:  1935/07/29   DATE OF ADMISSION:  06/08/2008  DATE OF DISCHARGE:  06/10/2008                               DISCHARGE SUMMARY   DISCHARGE DIAGNOSES:  1. Left lower lobe pneumonia, superimposed on chronic obstructive      pulmonary disease with exacerbation.  2. Low thyroid-stimulating hormone.  3. History of oxygen-dependent chronic obstructive pulmonary disease.  4. Chronic paroxysmal atrial fibrillation.  5. Coronary artery disease and diastolic congestive heart failure,      both stable during the hospitalization.   DISCHARGE MEDICATIONS:  1. Prednisone taper take as directed.  2. Avelox 400 mg daily for five more days.  3. Combivent MDI 2 puffs every 4 hours as needed for shortness of      breath and wheezing.  4. Advair Diskus 50/250 two puffs twice daily.  5. Flecainide 100 mg b.i.d.  6. Zocor 40 mg daily.  7. Singulair 10 mg daily.  8. Lasix 40 mg twice weekly.  9. Guaifenesin 600 mg twice daily.  10.Coumadin 2 mg take two tablets daily.  11.Oxygen 3 L per minute.   DISCHARGE DISPOSITION:  The patient is being discharged to home in  improved and stable condition.  He was advised to follow up with his  primary care physician Dr. Christell Constant in 3 days and with the Coumadin Clinic  in 3-7 days.   CONSULTATIONS:  None.   PROCEDURE PERFORMED:  1. Chest x-ray on June 10, 2008.  The results revealed persistent      left base opacity consistent with pneumonia.  2. Chest x-ray on June 09, 2008.  The results revealed increasing      opacity in the left lower lobe likely representing pneumonia.  Post      infectious/inflammatory changes of the right lung base, unchanged      from recent emphysema.   HISTORY OF PRESENT ILLNESS:  The patient  is a 76 year old man with past  medical history significant for oxygen-dependent COPD, coronary artery  disease, and chronic paroxysmal atrial fibrillation.  He presented to  the emergency department on June 08, 2008, with chief complaint of  shortness of breath.  During the evaluation in the emergency department,  he was started on oxygen therapy.  He was hemodynamically stable.  He  was afebrile at the time of the initial hospital assessment.  His chest  x-ray revealed a left lower lobe opacity that was apparently increasing  compared to the previous study.  The patient was therefore admitted for  further evaluation and management.   For additional details please see the dictated history and physical.   HOSPITAL COURSE:  1. LEFT LOWER LOBE PNEUMONIA WITH COPD EXACERBATION.  The patient was      started on empiric treatment with Avelox 400 mg daily, Solu-Medrol,      and albuterol and Atrovent nebulizers.  Mucinex was continued at 1  tablet twice daily.  Advair and Singulair were continued as well.      Cardiac enzymes as well as an ABG were ordered.  The patient's      cardiac enzymes were within normal limits.  His ABG revealed a pH      of 7.39, pCO2 of 47, and a pO2 of 103 on oxygen supplementation.      His BNP was within normal limits at 58.   On hospital day #2, the patient was significantly clinically improved.  The Solu-Medrol was tapered down to once daily.  A followup chest x-ray  was obtained this morning and it reveals a persistent left base opacity  consistent with pneumonia.  The patient has no complaints of shortness  of breath, pleurisy, or chest pain at this time.  He is oxygenating 91%  on room air.  He ambulated with the nursing staff yesterday afternoon  with no acute dyspnea.  He wants to go home and I believe it is  reasonable to discharge him to home today.  Upon discharge, the steroid  therapy will be changed to a prednisone taper.  He will receive  one more  intravenous dose of Avelox and then he will continue on oral Avelox for  5 more days.  Combivent MDI has been added to the medication regimen to  be used as needed.  1. PAROXYSMAL ATRIAL FIBRILLATION.  The patient's rate was controlled      during the hospitalization.  His INR was slightly subtherapeutic at      1.8.  The pharmacy staff was consulted to assist with Coumadin      management.  The patient's dose of Coumadin was increased slightly      to 5 mg daily during hospital course.  The patient usually takes 4      mg daily.  In light of ongoing antibiotic treatment, the patient      was advised to resume 4 mg of Coumadin daily upon discharge.  He      was strongly advised to follow up with Dr. Christell Constant and/or the      Coumadin Clinic in 3 days for reevaluation of his PT/INR.  The      patient voiced understanding.  2. LOW TSH.  The patient's TSH was assessed and found to be slightly      low at 0.272.  A free T4 was not ordered during the      hospitalization; however, the patient was advised to ask Dr. Christell Constant      to check a free T4 with the INR in 3 days.  The instructions were      written down for the patient and he voiced understanding.      Elliot Cousin, M.D.  Electronically Signed     DF/MEDQ  D:  06/10/2008  T:  06/10/2008  Job:  [id number]   cc:   [name], M.D.  Duke Salvia, MD, [facility]

## 2011-01-25 NOTE — Discharge Summary (Signed)
[name], [name] NO.:  [id number]   MEDICAL RECORD NO.:  [id number]          PATIENT TYPE:  INP   LOCATION:  4713                         FACILITY:  [facility]   PHYSICIAN:  Ruthy Dick, MD    DATE OF BIRTH:  Jun 04, 1935   DATE OF ADMISSION:  03/22/2009  DATE OF DISCHARGE:  03/26/2009                               DISCHARGE SUMMARY   REASON FOR ADMISSION:  COPD exacerbation.   FINAL DISCHARGE DIAGNOSES:  1. Acute exacerbation of chronic obstructive pulmonary disease.  2. Paroxysmal atrial fibrillation.  3. Supratherapeutic INR.  4. Hypertension.  5. History of transient ischemic attack.  6. Dyslipidemia.  7. Coronary artery disease.  8. Gastroesophageal reflux disease.  9. Chronic anticoagulation.   CONSULTS DURING THIS ADMISSION:  None.   PROCEDURES DONE DURING THIS ADMISSION:  None.   BRIEF HISTORY OF PRESENT ILLNESS AND HOSPITAL COURSE:  This is a 76-year-  old male who came to the emergency room with worsening shortness of  breath and was diagnosed as having COPD exacerbation, treated with nasal  oxygen, antibiotics, and steroids with good improvement of his symptoms.  As of today, the patient has a slight cough but is significantly  improved.  He says today he has no chest pain, no more shortness of  breath, no abdominal pain, no nausea or vomiting, no diarrhea, no  constipation, no dysuria, no frequency, and no urgency.  Denies syncope,  palpitation, and seizure.  Also, denies skin rash.   PHYSICAL EXAMINATION:  VITAL SIGNS:  Today, temperature 98.0, pulse 77,  respirations 20, blood pressure 103/50, and saturating 96% on 2 L nasal  cannula.  CHEST:  Clear to auscultation bilaterally.  No rhonchi, no rales, and no  wheezing.  ABDOMEN:  Soft and nontender.  EXTREMITIES:  No clubbing, no cyanosis, and no edema.  CARDIOVASCULAR SYSTEM:  First and second heart sounds only.  CENTRAL NERVOUS SYSTEM:  Nonfocal.   The patient is to follow with  [facility] in 1-2 weeks  and he is to call for this appointment.   DISCHARGE MEDICATIONS:  He is to go home on the following medications:  1. Avelox 400 mg p.o. daily for 3 days.  2. Medrol Dosepak #1 per instructions.  3. Pulmicort 1 puff q.12 h. (0.5 mg dosage).  4. Albuterol nebulizations 2.5 mg q.6 h. p.r.n. shortness of breath.  5. Atrovent nebulizations 0.5 mg q.6 h. p.r.n. for shortness of      breath, but should be given together.  6. Robitussin AC 5 mL p.o. q.6 h. p.r.n. for cough.  7. Enteric-coated aspirin 81 mg daily.  8. Flovent 112 mcg inhaler 2 puffs b.i.d.  9. Lasix 20 mg daily.  10.Potassium chloride 10 mEq p.o. daily, hold if Lasix is not given.   The patient is to return to the emergency room if he has any worsening  of symptoms or recurrence of symptoms.  He is also to take flecainide  100 mg twice daily, Cardizem 120 mg daily, Singulair 10 mg daily,  simvastatin 40 mg at bedtime, lisinopril 10 mg daily, Combivent inhaler  2 puffs  4 times daily, Coumadin 2 mg every other day and alternative 1  mg every other day, the patient is to take 1 mg today, and Nexium 40 mg  daily.  The patient is to continue his oxygen at 2 L.   TIME USED FOR DISCHARGE PLANNING:  Greater than 30 minutes.      Ruthy Dick, MD  Electronically Signed     GU/MEDQ  D:  03/26/2009  T:  03/27/2009  Job:  [id number]   cc:   [facility]

## 2011-01-25 NOTE — Op Note (Signed)
NAMERAYDELL, MANERS NO.:  [id number]   MEDICAL RECORD NO.:  [id number]           PATIENT TYPE:   LOCATION:                                 FACILITY:   PHYSICIAN:  Rollene Rotunda, MD, FACCDATE OF BIRTH:  1934/10/07   DATE OF PROCEDURE:  12/31/2007  DATE OF DISCHARGE:                               OPERATIVE REPORT   PROCEDURE:  Direct current cardioversion.   INDICATIONS:  Symptomatic atrial fibrillation.   PROCEDURE NOTE:  The patient signed appropriate informed consent.  He  had adequate anticoagulation as documented in the rounding note today by  Dr. Sherryl Manges.  He was given anesthesia per the Anesthesia Service  with 125 mg of sodium pentothal.  His airway was protected with bag  ventilation.  He was hemodynamically monitored.  He was cardioverted x1,  with 150 J of biphasic synchronized energy.  This resulted in normal  sinus rhythm with occasional PAC.  The patient awoke without any  complaints or complications.  The EKG following demonstrated normal  sinus rhythm with PACs.   PLAN:  The patient will continue with anticoagulation.  At this point,  there is no plan for atrial flutter ablation.      Rollene Rotunda, MD, [facility]  Electronically Signed     JH/MEDQ  D:  12/31/2007  T:  01/01/2008  Job:  [id number]   cc:   Lonia Blood, M.D.

## 2011-01-25 NOTE — Assessment & Plan Note (Signed)
 HEALTHCARE                         ELECTROPHYSIOLOGY OFFICE NOTE   NAME:[name], [name]                    MRN:          [id number]  DATE:06/26/2008                            DOB:          July 02, 1935    Mr. [name] comes in with a history paroxysmal atrial fibrillation for  which she takes flecainide.  He had an episode in September with  palpitations that were brief and self resolving.  He also has an episode  where he was hospitalized because of shortness of breath which was felt  apparently to be related to a pneumonia which is much improved.  He  feels great at this point without complaints of chest pain or extra  shortness of breath.  He is able to do what he wants and rest when he  needs to.   His medications include Coumadin, Lasix, Advair, and flecainide.   On examination, his blood pressure was 138/72 with a pulse of 70.  His  weight was 260 which is up about 8 pounds in the last couple of months.  His lungs were clear.  Heart sounds were regular.  The extremities were  without edema.   Electrocardiogram dated today demonstrated sinus rhythm at 70 with  intervals of 0.21/0.10/0.40, the axis was 60 degrees.   IMPRESSION:  1. Paroxysmal atrial fibrillation on flecanide  2. Chronic obstructive pulmonary disease.   Mr. [name] is stable.  We plan to see him again in 6 months.     Duke Salvia, MD, [facility]  Electronically Signed    SCK/MedQ  DD: 06/26/2008  DT: 06/27/2008  Job #: [id number]   cc:   [name], M.D.

## 2011-01-25 NOTE — H&P (Signed)
NAMEMASAHIRO, IGLESIA NO.:  [id number]   MEDICAL RECORD NO.:  [id number]          PATIENT TYPE:  INP   LOCATION:  1823                         FACILITY:  [facility]   PHYSICIAN:  Lonia Blood, M.D.DATE OF BIRTH:  12-10-1934   DATE OF ADMISSION:  08/24/2008  DATE OF DISCHARGE:                              HISTORY & PHYSICAL   PRIMARY CARE PHYSICIAN:  Unassigned.   CHIEF COMPLAINT:  Shortness of breath.   HISTORY OF PRESENT ILLNESS:  Mr. Jerrion Tabbert is a very pleasant 76-  year-old gentleman with a known history of severe O2 dependent COPD due  to a long standing prior history of tobacco abuse. He presented to the  hospital on August 21, 2008 via the emergency room for evaluation.  Chest x-ray at that time was accomplished. The patient was told that he  did not have pneumonia and the patient was sent home. He had been having  approximately a 1 week history of increasing shortness of breath at that  time. He states that over the last 48 hours, his shortness of breath has  gotten even worse. He has been using MDI's at home and does not use home  nebulizers due to difficulty with atrial fibrillation. He has not been  able to control his shortness of breath and it is simply worse. Today,  when he could barely catch his breath and was noting intermittent fevers  and chills, he summoned his son to transport him to the emergency room.  He denies substernal chest pressure. He denies nausea and vomiting but  has had some watery bowel movements on a couple of occasions recently.  He has had a significant cough, which has been productive of thick  greenish and whitish phlegm.   REVIEW OF SYSTEMS:  Comprehensive review of systems is negative with the  exception of the positive's noted in the HPI above.   PAST MEDICAL HISTORY:  1. Severe O2 dependent COPD.  2. Admission to the hospital June 10, 2008 with left lower lobe      pneumonia.  3. Chronic atrial  fibrillation, on Flecainide.  4. Coronary artery disease, which was deemed non-obstructive via      cardiac catheterization July 2009.  5. Diastolic congestive heart failure.  6. Gastroesophageal reflux disease.  7. Hypertension.  8. Prior history of tobacco abuse.   OUTPATIENT MEDICATIONS:  1. Aspirin 81 mg daily.  2. Combivent 2 puffs q.4 hours p.r.n.  3. Advair 2 puffs b.i.d.  4. Flecainide 100 mg b.i.d.  5. Zocor 40 mg daily.  6. Singulair 10 mg daily.  7. Lasix 40 mg b.i.d.  8. Guaifenesin 600 mg b.i.d.  9. Coumadin 4 mg q.h.s.  10.Lisinopril 10 mg daily.   ALLERGIES:  NO KNOWN DRUG ALLERGIES.   FAMILY HISTORY:  Noncontributory.   SOCIAL HISTORY:  The patient [place] in [place] surrounding community. He does  not smoke. He does not drink. He is not currently working. He has  multiple family members who are supportive and assist in his care.   LABORATORY DATA:  White count 15.4 with normal hemoglobin, normal  platelet  count, normal MCV. B-met is totally unremarkable with the  exception of a mildly elevated creatinine at 1.3. INR is 2.7. Myoglobin  is 204. MB negative, troponin negative, BNP is 57.   DIAGNOSTIC STUDIES:  Chest x-ray reveals possibly early developing  infiltrates in the bilateral bases.   PHYSICAL EXAMINATION:  VITAL SIGNS:  Temperature 99.8, blood pressure  109/57, heart rate 98, respiratory rate 26, O2 sat is 95% on 1 liter per  minute nasal canula.  GENERAL:  A well developed, well nourished gentleman in moderate acute  respiratory distress with purse lip breathing and difficulty completing  full sentences.  HEENT:  Normocephalic and atraumatic. Pupils are equal, round, and  reactive to light and accommodation. Extraocular muscles intact  bilaterally. OC/OP clear.  NECK:  No JVD.  LUNGS:  No audible wheeze but poor air movement throughout all fields  with prolonged expiratory phase and bibasilar crackles.  CARDIOVASCULAR:  Distant but regular without  gallop or rub. Normal S1  and S2.  ABDOMEN:  Overweight, soft. Bowel sounds present. No organomegaly, no  rebound, no ascites.  EXTREMITIES:  No significant clubbing, cyanosis, or edema bilateral  lower extremities.  NEUROLOGIC:  Non-focal neurologic examination.   IMPRESSION/PLAN:  1. ACUTE CHRONIC OBSTRUCTIVE PULMONARY DISEASE EXACERBATION:  The      patient is indeed suffering with significant acute COPD      exacerbation. I suspect an acute infectious bronchitis plus/minus      an early developing pneumonia is the etiology. The patient will be      placed in the acute unit. Will monitor him on telemetry due to his      history of atrial fibrillation and propensity for his nebulizers to      exacerbate this. Dose the patient with Solu-Medrol. We will monitor      his O2 saturations. Will check an ABG as a baseline at the present      time. The patient is awake and alert, however, and I do not feel      that Bi-PAP will be necessary at the present time.  2. ACUTE INFECTIOUS BRONCHITIS: The patient will be treated with broad      spectrum empiric antibiotics using Rocephin and Azithromycin. Will      follow the patient clinically. Humibid will also be administered.  3. CHRONIC ATRIAL FIBRILLATION:  At the present time, the patient      appears to be in normal sinus rhythm as per auscultation. We will      continue his Flecainide. Will continue his Coumadin. Will follow      him on telemetry.  4. NONOBSTRUCTIVE CORONARY DISEASE:  The patient has no symptoms to      suggest unstable angina pectoris. Will continue his aspirin and      follow him closely. He is clearly a very poor candidate for beta      blockers.  5. DIASTOLIC CONGESTIVE HEART FAILURE:  The patient's CHF appears to      be well compensated at the present time. There is no evidence of      volume overload. We will hold diuretics until the patient      stabilizes and oral intake is resumed.  6. HYPERTENSION:  The  patient's blood pressure is currently well      controlled. We will hold his diuretic, as he does appear to be      somewhat volume depleted. We will assess his volume and when his  oral intake improves, we will resume Lasix.      Lonia Blood, M.D.  Electronically Signed     JTM/MEDQ  D:  08/24/2008  T:  08/24/2008  Job:  [id number]

## 2011-01-25 NOTE — H&P (Signed)
[name], [name] NO.:  [id number]   MEDICAL RECORD NO.:  [id number]          PATIENT TYPE:  INP   LOCATION:  2313                         FACILITY:  [facility]   PHYSICIAN:  [name], MDDATE OF BIRTH:  December 16, 1934   DATE OF ADMISSION:  12/24/2007  DATE OF DISCHARGE:                              HISTORY & PHYSICAL   CHIEF COMPLAINT:  Shortness of breath.   HISTORY OF PRESENTING ILLNESS:  [name] is a 76 year old gentleman  sent in tonight from his primary care doctor's office where he presented  for checkup with complaints of shortness of breath.  Empirically, the  patient appeared quite ill in doctor's office and was sent in to the  emergency department for evaluation.  The patient tells me he was  recently on a prednisone taper just about a month ago, but over the last  several days he has felt terrible.  He has had fevers, cough productive  of yellow sputum, sore throat, no appetite, and severe shortness of  breath.  The patient has a nebulizer machine at home, which he has been  using, but he still felt terrible.  He denies any diarrhea.  He also has  been having sweats, but no chest pain in the last several days.  He was  seen in the hospital for chest pain a month ago and at that time had a  cardiac catheterization, which was negative for any obstructive disease.  His past medical history is also significant for COPD and hypertension.  Head CTs have revealed evidence of old stroke, though he does not have  clinical symptoms.  The patient had an echocardiogram on December 17, 2007,  which revealed a normal ejection fraction of 60%.  It was a technically  difficult study because limited with dose, but the patient did have his  cardiac catheterization in March 2009, which revealed mild  atherosclerosis in multiple vessels essentially, but nothing amenable to  the therapy, it is all very mild, essentially no significant coronary  artery disease.  He  had a hyperdynamic left ventricle at that time.   MEDICATIONS:  Medications on arrival per his discharge summary last  month include:  1. Lotrel 5/20 one daily.  2. Advair 250/50 one puff b.i.d.  3. Aspirin 325 mg daily.  4. Hydrochlorothiazide 12.5 mg daily.  5. Zocor 20 mg p.o. nightly.  6. Albuterol, he is on nebulizer solution at home.   FAMILY HISTORY:  Significant for father who died of a brain aneurysm.   SOCIAL HISTORY:  The patient quit smoking 10 years ago.  No alcohol,  however, he was a former drinker.   REVIEW OF SYSTEMS:  Deferred except for what was in the HPI, because of  shortness of breath and SVT.  Also apparently, the patient has been on  Spiriva handheld inhaler per his med list.   PHYSICAL EXAMINATION:  VITAL SIGNS:  In the emergency department, his T-  max is 99.3, heart rate in the highs were in the 160s, blood pressure  initially was 155/82 as low it was 65/38, sating 91% on room air, but  97% on 2 liters nasal cannula.  GENERAL:  The patient is doing better, no longer in distress.  HEENT:  Normocephalic, atraumatic.  Sclerae nonicteric.  Oral mucosa  dry.  NECK:  Supple.  No lymphadenopathy.  No thyromegaly.  No jugular venous  distention.  CARDIAC:  Tachycardic and regular.  LUNGS:  Revealed some rhonchi bilaterally, but no severe wheezing.  ABDOMEN:  Soft, nontender, and nondistended.  No rebound or guarding.  Slightly distended.  No masses are appreciated.  EXTREMITIES:  Revealed no evidence of clubbing, cyanosis, or edema.  NEUROLOGIC:  He is alert and oriented x3.  His cranial nerves II through  XII are intact grossly.  He moves each of his extremities without clear  evidence of focal motor deficit.  He can go from supine to sitting in  the bed without any assistance.  SKIN:  Grossly intact with no open lesions or rashes, though his sacral  area was not examined.   DATA:  The patient's chest x-ray today reveals no acute findings or  evidence of  COPD.  He has had an ABG, which revealed pH of 7.38, pCO2 of  41.8, pO2 of 72, I believe that was on 2 liters nasal cannula.  This is  all the blood work that has been done at this time.   ASSESSMENT AND PLAN:  1. Respiratory distress likely secondary to chronic obstructive      pulmonary disease flare.  We will put the patient on nebs, oxygen,      steroids, and empiric antibiotics as he is febrile.  We will check      a D-dimer, but likely we will go ahead and get a CT scan of his      chest in regard because the extent of his respiratory distress      seems to be slightly greater than his clinical lung exam which      suggest he is not significantly hypoxic, however.  2. Supraventricular tachycardia.  The patient got 10 mg of IV Cardizem      in the emergency department and his systolic blood pressure dropped      into the 60s.  Cardiology has been consulted and did see the      patient before I did.  They recommend hydrating the patient and no      further rate control until the patient is hydrated.  He did have an      essentially negative cath last month so he will likely can tolerate      a little tachycardia.  3. Likely sepsis.  The patient has been having fever and chills at      home for several days.  We will check a CBC to get his white count.      He has a low-grade fever so far here in the emergency department,      but with severe chills.  He may be sent for blood cultures, urine      culture, and sputum culture will be ordered, but I will empirically      start him on Fortaz, Levaquin, and vancomycin.  Because of his      frequent steroid use his hypotension while precipitated by the      Cardizem has been somewhat long lasting here in the emergency      department and may be impart because of sepsis.  This patient is      quite ill and will be admitted to step-down  unit and certainly if      we can get his blood pressure up, he will go to a full ICU bed.  We      will  hold all of his unessential medications at this time.      Cody Bottoms, MD  Electronically Signed     CVC/MEDQ  D:  12/25/2007  T:  12/25/2007  Job:  [id number]

## 2011-01-25 NOTE — Consult Note (Signed)
NAMEMarland Kitchen  CLEVLAND, CORK NO.:  [id number]   MEDICAL RECORD NO.:  [id number]          PATIENT TYPE:  EMS   LOCATION:  MAJO                         FACILITY:  [facility]   PHYSICIAN:  Marca Ancona, MD      DATE OF BIRTH:  05/19/1935   DATE OF CONSULTATION:  06/06/2008  DATE OF DISCHARGE:  06/05/2008                                 CONSULTATION   PRIMARY CARDIOLOGIST:  Duke Salvia, MD, [facility].   PRIMARY CARE Salvador Coupe:  [name], MD in [place].   PATIENT PROFILE:  A 76 year old Caucasian male with history of  paroxysmal atrial fibrillation who presents following an episode of  tachy palpitations.   PROBLEM LIST:  1. Paroxysmal atrial fibrillation.      a.     Status post unsuccessful cardioversion in April 2009.      b.     Now on flecainide and Coumadin therapy, although the patient       reports Coumadin noncompliance.  2. Nonobstructive coronary artery disease.      a.     Cardiac catheterization in July 2009, left main minor       irregularities, left anterior descending diffuse irregularities,       left circumflex 25%, proximal ramus intermedius normal, right       coronary artery 25% mid, posterior descending artery normal,       ejection fraction 6% with normal wall motion.  3. Chronic obstructive pulmonary disease on home O2 at 3 liters per      minute at bedtime.  4. Chronic diastolic congestive heart failure.      a.     December 17, 2007, 2D echocardiogram, ejection fraction 65%.  5. Hypertension.  6. History of  lacunar cerebrovascular accident.  7. Hyperlipidemia.  8. Remote tobacco abuse.  9. Gastroesophageal reflux disease.   HISTORY OF PRESENT ILLNESS:  A 76 year old Caucasian male with a history  of PAF and nonobstructive CAD.  He is on flecainide b.i.d. and is  supposed to be on Coumadin but admits to frequent skipping of doses  secondary to bruising.  His INR is 1.3 today.  He was in his usual state  of health until today at approximate  4:00 p.m. when he had just finished  riding his lawn mower to finish his lawn and he got off the mower and  went to the back of his house when he had sudden onset of shortness  breath, tachy palpitations, diaphoresis, and fluttering sensation in is  chest, similar to his episode of AFib in April 2009.  He called his son  who came to the house and took him to the local fire station where his  heart rate was reportedly in the 110s.  He was given nonrebreather mask,  and after approximately 45 minutes, he started feeling better.  His  heart rate came down into the 80s by the time EMS had arrived.  He was  taken to the [facility] ED where currently he is in sinus rhythm and  offers no complaints.   ALLERGIES:  No known drug allergies.  HOME MEDICATIONS:  Coumadin sometimes, Lasix 40 mg p.r.n. which he  normally takes twice a week, Advair 50/250 two puffs b.i.d., flecainide  1 mg b.i.d., Zocor unsure of the dose nightly, Singulair 10 mg daily,  Mucinex 600 mg b.i.d.   FAMILY HISTORY:  Mother died of brain aneurysm at age 12.  Father died  of COPD at 49. He has 2 sisters who are alive and well.  He has a  brother who is alive and well and another brother who died of cancer.   SOCIAL HISTORY:  He lives in [place] with his grandson.  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  He  has about 120-pack year history of tobacco abuse, smoking 3 packs a day  for 40 years, quitting about 5 years ago.  He denies any alcohol or  drugs and stays pretty active around the house.   REVIEW OF SYSTEMS:  Positive for diaphoresis, shortness of breath, and  focal left-sided chest discomfort at the left anterior axillary line,  somewhere around the 6th-7th intercostal space that is only during his  episode of tachy palpitations.  Otherwise, all systems reviewed and  negative.   PHYSICAL EXAMINATION:  VITAL SIGNS:  Temperature 97.4, heart rate 92,  respirations 20, blood  pressure 102/60, pulse ox 96% on 2 liters.  GENERAL:  A pleasant white male, in no acute distress, awake, alert, and  oriented x3.  HEENT:  Normal.  NEUROLOGIC:  Nerves are grossly intact and nonfocal.  SKIN:  Warm and dry without lesions or masses.  NECK:  No bruits or JVD.  LUNGS:  Respirations are unlabored with diminished breath sounds at the  bases, otherwise, clear to auscultation.  CARDIAC:  Regular.  Distant S1 and S2.  No murmurs.  ABDOMEN:  Round, soft, nontender, and nondistended.  Bowel sounds  present x4.  EXTREMITIES:  Warm, dry, and pink.  No clubbing, cyanosis, or edema.  Dorsalis pedis and posterior tibial pulses are 2+ and equal bilaterally.   Chest x-ray shows no acute cardiopulmonary disease and emphysema, post  infectious/inflammatory changes in the right lower lobe.  EKG shows  sinus rhythm with first-degree AV block and rate is 73, normal axis, no  acute ST-T changes.  Hemoglobin 13.5, hematocrit 39.5, WBC 8.2,  platelets 192,000.  Sodium 142, potassium 4.1, chloride 107, CO2 30, BUN  12, creatinine 1.09, glucose 96.  Total protein 6.6 and albumin 4,1.  AST 20 and ALT 16.  INR 1.3.  Calcium 9.7.  CK-MB 1.2, troponin I less  than 0.5.   ASSESSMENT/PLAN:  1. Tachy palpitations/dyspnea/paroxysmal atrial fibrillation:  The      patient has a history of paroxysmal atrial fibrillation on      flecainide.  He says that his symptoms today were similar to PAF      symptoms in April 2009, thus suspect that he had a recurrent      episode.  He did have documented increased heart rate at the fire      station, which subsequently resolved after 45 minutes.  He has been      in sinus in the ED, and he is now symptom free.  His INR is only      1.3, and he says he has been taking his Coumadin occasionally      secondary to bruising.  We had a long discussion regarding the      importance of Coumadin compliance and stroke prevention, and he  says he understands that and  will start taking his Coumadin      regularly.  We will plan to discharge him home from the emergency      room.  I will arrange a followup appointment with Dr. Graciela Husbands, as      well as an event recorder to get a feel for his burden of atrial      fibrillation.  I will also have him follow up in [facility]      early next week.  2. Chronic obstructive pulmonary disease:  He is not actively      wheezing.  Continue home O2 at bedtime, as well      as an inhaler therapy.  3. Hypertension, stable.  4. Hyperlipidemia:  He takes statin at home.      Nicolasa Ducking, ANP      Marca Ancona, MD  Electronically Signed    CB/MEDQ  D:  06/05/2008  T:  06/06/2008  Job:  [id number]

## 2011-01-25 NOTE — Discharge Summary (Signed)
[name], VANGORDER NO.:  [id number]   MEDICAL RECORD NO.:  [id number]          PATIENT TYPE:  INP   LOCATION:  1334                         FACILITY:  [facility]   PHYSICIAN:  Hillery Aldo, M.D.   DATE OF BIRTH:  [birth year]   DATE OF ADMISSION:  11/19/2008  DATE OF DISCHARGE:  11/24/2008                               DISCHARGE SUMMARY   PRIMARY CARE PHYSICIAN:  Paulita Cradle, FNP at [facility].   DISCHARGE DIAGNOSES:  1. Chronic obstructive pulmonary disease with acute exacerbation.  2. Pneumonia.  3. Hypertension.  4. Dyslipidemia.  5. Chronic atrial fibrillation.  6. Gastroesophageal reflux disease.  7. Hypokalemia.  8. Mild normocytic anemia.  9. Cerebrovascular disease/small vessel disease.  10.Steroid-induced hyperglycemia.  11.Low TSH, free T4 normal, follow-up thyroid studies recommended and      6 week's time.   DISCHARGE MEDICATIONS:  1. Albuterol HFA two puffs q.4h. p.r.n.  2. Zocor 40 mg daily.  3. Lasix 40 mg b.i.d.  4. Amlodipine/benazepril 5/20 daily.  5. Singulair 10 mg daily.  6. Nexium 20 mg daily.  7. Flecainide 100 mg b.i.d.  8. Diltiazem 120 mg daily.  9. Advair Diskus 250/50 one inhalation b.i.d.  10.Oxygen at 2 L by nasal cannula p.r.n.  11.Coumadin 4 mg daily.  12.Mucinex 600 mg b.i.d. p.r.n. chest congestion.  13.Avelox 400 mg daily x2 more days.  14.Prednisone taper 60 mg on November 25, 2008, taper by 10 mg daily      until off.   CONSULTATIONS:  None.   BRIEF ADMISSION HPI:  The patient is a 76 year old male with past  medical history of oxygen-dependent chronic obstructive pulmonary  disease who presented to the hospital with a chief complaint of  progressive dyspnea over 2 weeks prior to presentation.  Prior to  decompensation, he was only using his oxygen at night, but before  admission, he began to use his oxygen nonstop.  He had also been using  his inhalers more frequently than normal and  he reported a cough  productive of thick yellow sputum.  He was admitted for further  evaluation and treatment.  For full details, please see the dictated  report done by Dr. Glade Callas.   PROCEDURES AND DIAGNOSTIC STUDIES:  1. Chest x-ray on November 19, 2008. showed no active disease.  2. Chest x-ray on November 21, 2008, showed hyperinflation and bronchial      thickening.  3. Chest x-ray on November 23, 2008, showed suspicion of worsening in the      lower lobes, likely due to atelectasis or mild pneumonia.      Bronchial thickening persists.   DISCHARGE LABORATORY VALUES:  PT/INR was 27.8/2.4.  Sodium was 140,  potassium 4.4, chloride 106, bicarb 29, BUN 19, creatinine 0.87, glucose  161.  White blood cell count was 9.4, hemoglobin 11.9, hematocrit 36.2,  platelets 206.   HOSPITAL COURSE BY PROBLEM:  1. Acute infectious exacerbation of chronic obstructive pulmonary      disease/pneumonia:  The patient was admitted and empirically put on      Avelox and Solu-Medrol therapy.  He was provided with nebulized      bronchodilators and Mucinex.  He did have significant bronchospasm      and steroids were subsequently tapered as tolerated.  Serial chest      radiographs did ultimately show evidence for pneumonia.  He also      provided with Tussionex for cough.  On November 21, 2008, we began to      taper his steroids.  On November 22, 2008, he experienced increased      bronchospasm and no further steroid reduction was done at that      time.  Pre and post nebulizer peak flows did reveal a good response      to bronchodilator therapy.  Post nebulizer peak flows are still low      at approximately 250 mL.  At this point, his lungs are clear and he      feels well.  He will complete 2 additional days of treatment with      Avelox, and we will initiate a prednisone taper over the next 6      days.  The patient has a follow-up with his primary care Kassey Laforest      on Thursday, November 27, 2008 at 3:50 p.m.   2. Hypertension:  The patient's blood pressure was well-controlled      throughout his hospital stay.  He is maintained on his usual      medications with the exception of Lasix which was held due to his      acute illness.  3. Chronic atrial fibrillation:  The patient has remained rate      controlled.  His INR was supratherapeutic for several days and      necessitated adjusting his Coumadin dose.  His discharge INR is      stable at 2.4 and he will follow up for a recheck of his PT/INR in      follow up with primary care Karene Bracken this Thursday, November 27, 2008      at 3:50 p.m.  4. Gastroesophageal reflux disease:  The patient remained asymptomatic      on proton-pump-inhibitor therapy.  5. Hypokalemia:  The patient was appropriately repleted.  6. Mild normocytic anemia:  The patient's hemoglobin and hematocrit      have remained stable throughout his hospital stay.  7. Steroid-induced hyperglycemia:  The patient did have elevated      glucoses corresponding to initiation of steroids.  A hemoglobin A1c      was checked and found to be 5.6 which indicates that he is not      diabetic at baseline.  We expect his glycemic control will      normalize as his steroids are tapered.  8. Cerebrovascular disease/small vessel disease:  The patient has      remained on Coumadin with good control of his modifiable risk      factors.  9. Low TSH:  The patient did have thyroid studies done and his TSH was      low at 0.113.  Subsequent testing of his free T4 indicated a normal      value of 1.3.  He should have follow-up thyroid studies done in      approximately 6 weeks to ensure that his thyroid function has      normalized.   DISPOSITION:  The patient is medically stable and will be discharged  home.   Time spent coordinating care for discharge and on discharge instructions  equals 35 minutes.      Hillery Aldo, M.D.  Electronically Signed     CR/MEDQ  D:  11/24/2008  T:   11/24/2008  Job:  [id number]   cc:   Paulita Cradle, FNP  [facility]

## 2011-01-25 NOTE — Assessment & Plan Note (Signed)
[facility]                         ELECTROPHYSIOLOGY OFFICE NOTE   NAME:SORRELLWendell, Nicoson                    MRN:          [id number]  DATE:10/22/2008                            DOB:          [birth year]    Mr. Halperin was seen in follow-up for atrial fibrillation for which he  was taking flecainide.  He ended up in hospital in the middle of January  because of a couple of syncopal episodes, the mechanism of which was not  determined.  He does have known normal left ventricular function and no  ischemic heart disease.   He also has a history of TIAs in the past.   He has had no problems with syncope since discharge.  He has had some  episodes of dizziness by event loop recording.  There were no  significant arrhythmias identified.  There were some PVCs and some sinus  tachycardia.   CURRENT MEDICATIONS:  1. Coumadin, Lasix, flecainide and Zocor, all of which he is out of      and we have refilled today.  The Singulair I told him not to pick      up until he saw you again.   EXAMINATION:  His weight was 210.  His blood pressure was 138/88, his  pulse was 84.  These parameters were all stable.  LUNGS:  Clear.  HEART:  His heart sounds were regular.  ABDOMEN:  Soft.  EXTREMITIES:  Had no edema.   Electrocardiogram, dated today, demonstrated sinus rhythm at 84 with  intervals of 0.22/0.11/0.38.  Electrocardiogram was otherwise normal.   IMPRESSION:  1. Paroxysmal atrial fibrillation on flecainide.  2. Hypertension.  3. Coumadin therapy for #1 with a [place] score of 4, based on a) age, b)      hypertension, c) prior TIA.  4. Syncope, question mechanism.  5. Dizziness, unassociated with arrhythmia.   We will plan to continue him on his current medications and refill them,  as noted above.   We will have to be careful about the Coumadin, given his of syncope.   I have suggested to him that, if there is any further presyncope or  syncope, I  think it would be worth implanting a loop recorder so as to  make sure there is not a remediable cause for it, as the risks of  syncope with his Coumadin are clearly quite high.     Duke Salvia, MD, [facility]  Electronically Signed    SCK/MedQ  DD: 10/22/2008  DT: 10/22/2008  Job #: [id number]   cc:   [name], M.D.

## 2011-01-25 NOTE — Discharge Summary (Signed)
NAMESHYNE, [name] NO.:  [id number]   MEDICAL RECORD NO.:  [id number]          PATIENT TYPE:  INP   LOCATION:  3715                         FACILITY:  [facility]   PHYSICIAN:  Lonia Blood, M.D.       DATE OF BIRTH:  October 20, 1934   DATE OF ADMISSION:  12/24/2007  DATE OF DISCHARGE:                               DISCHARGE SUMMARY   PRIMARY CARE PHYSICIAN:  Dr. Rudi Heap.   DISCHARGE DIAGNOSES:  1. Haemophilus influenzae pneumonia - resolved.  2. Chronic obstructive pulmonary disease exacerbation - resolving.  3. Diastolic heart failure exacerbation.  4. Atrial fibrillation/flutter with rapid ventricular response -      difficult to control and resistant to cardioversion.  5. Hypertension.  6. History of lacunar stroke.  7. Hyperlipidemia.   DISCHARGE MEDICATIONS:  Disposition will be dictated at the time of the  actual discharge of this patient.   PROCEDURES DURING THIS ADMISSION:  1. Chest, PA and lateral, with findings of COPD.  2. On December 25, 2007, CT angiogram of the chest, findings of no acute      pulmonary embolus.  3. On December 27, 2007, chest x-ray PA and lateral with findings of a      newly developed right lower lobe airspace disease.  4. On December 31, 2007, direct current cardioversion by Dr. Rollene Rotunda under general anesthesia.   CONSULTATIONS:  Dr. Sherryl Manges, Dr. [place] Bing, Dr. Rollene Rotunda, and Dr. Charlton Haws.   HISTORY AND PHYSICAL:  For admission history and physical, refer to the  dictated H&P which was done by Dr. Gasper Sells.   HOSPITAL COURSE:  [name] was admitted with dyspnea, acute  respiratory failure on December 24, 2007.  The patient was treated with  intravenous steroids, oxygen, empiric antibiotics.  He had a CT scan of  his chest which was negative for pulmonary emboli.  The patient was also  noted to be quite tachycardiac and he was found to be in atrial flutter.  He then switched to atrial fibrillation.   The patient's rate was  controlled with intravenous diltiazem as well as intravenous digoxin.  He was started on intravenous heparin and then transitioned to oral  Coumadin.  For his respiratory problems, [name] was treated with  bronchodilators, steroids, and Avelox and he is now stable on 2 L of  oxygen, his home dose.  For his atrial fibrillation, the patient  received anticoagulation and direct current  cardioversion on December 31, 2007, which proved initially successful.  He  then reverted back to atrial fibrillation with rapid ventricular  response about 4 hours after procedure.  He is currently loaded with  flecainide and he is considered for repeat direct current cardioversion  on January 03, 2008.  The patient is to continue Coumadin lifelong.      Lonia Blood, M.D.  Electronically Signed     SL/MEDQ  D:  01/01/2008  T:  01/02/2008  Job:  [id number]   cc:   Rollene Rotunda, MD, [facility]  Duke Salvia, MD, [facility]  [name],  M.D. 

## 2011-01-25 NOTE — Consult Note (Signed)
NAMENIKHIL, OSEI NO.:  [id number]   MEDICAL RECORD NO.:  [id number]          PATIENT TYPE:  INP   LOCATION:  2313                         FACILITY:  [facility]   PHYSICIAN:  Unice Cobble, MD     DATE OF BIRTH:  [birth year]   DATE OF CONSULTATION:  12/25/2007  DATE OF DISCHARGE:                                 CONSULTATION   CHIEF COMPLAINT:  SVT.   HISTORY OF PRESENT ILLNESS:  This is a 76 year old white male with a  history of MI and recent catheterization showing nonobstructive disease,  hypertension, hyperlipidemia, and history of TIAs who presents with one  week shortness of breath, cough productive for yellow sputum, fevers,  chills, and sweats.  The patient has not drank anything for 3 days due  to the illness.  He denies any chest pain since last Friday on which day  he had similar chest pain to the noncardiac chest pain he received his  catheterization for.  He denies edema, PND, and orthopnea.  In the  emergency department, the patient was found to have SVT and a consult  was requested.   PAST MEDICAL HISTORY:  1. MI.  2. Noncardiac chest pain.  3. GERD.  4. Hypertension.  5. COPD on home O2 3 liters.  6. Hyperlipidemia.  7. TIAs.  8. Cardiac catheterization November 15, 2007, showing nonobstructive      disease in a hyperdynamic LV.  9. Echocardiogram performed December 17, 2007, showed an ejection fraction      of 60%.   ALLERGIES:  NO KNOWN DRUG ALLERGIES.   MEDICATIONS:  1. Lotrel 5/20 mg daily.  2. Advair discus 250/50 b.i.d.  3. Albuterol.  4. Hydrochlorothiazide 12.5 mg daily.  5. Spiriva.  6. Aspirin 325 mg daily.  7. Zocor 20 mg daily.   SOCIAL HISTORY:  He is a former smoker.  Denies alcohol or drugs.   FAMILY HISTORY:  His mother died at the age of 48 of brain aneurysm.  His father died of an MI in the 52s.   REVIEW OF SYSTEMS:  The patient endorses fevers, chills, and sweating.  He has had some chest pain and shortness of breath  as well as a  productive cough.   All other systems reviewed and found to be negative as stated in the  HPI.   HISTORY OF PRESENT ILLNESS:  VITAL SIGNS:  Temperature is 99.2 with a  pulse of 154, respiratory rate is 26, blood pressure 99/55, O2 sats are  97% on 3 liters nasal cannula.  GENERAL:  This is an ill-appearing diaphoretic white male who has soaked  his sheets with sweat.  There is a basin full of purulent sputum next to  him.  HEENT:  Normocephalic, atraumatic, PERRLA, EOMI, MMM.  Poor dentition.  NECK:  Supple without lymphadenopathy, thyromegaly, bruits or jugular  venous distention.  HEART:  A regular rate and rhythm with a normal S1-S2.  No murmurs,  gallops or rubs.  Normal PMI.  Pulses 2+ and equal bilaterally without  bruits.  LUNGS:  Show some mild crackles and decreased breath sounds  on the right  base, but otherwise clear.  SKIN:  Without rashes and no rashes or lesions.  ABDOMEN:  Soft and nontender with normal bowel sounds and without  rebound or guarding.  Negative splenomegaly.  EXTREMITIES:  Show no cyanosis, clubbing or edema.  MUSCULOSKELETAL:  Shows no joint deformity or effusions.  No spine or  CVA tenderness.  Neurological:  He is alert and oriented x3.  Cranial nerves II-XII  grossly intact.  Strength 5/5 in all extremities and axial groups with  normal sensation throughout.   RADIOLOGY:  Chest x-ray shows COPD and no acute findings, although it  appears that there is a chronic right base opacity.   EKG:  There are to the EKGs.  The first EKG shows sinus tachycardia at a  rate of 161 beats per minute.  EKG #2 shows a sinus tachycardia that  degenerates into afebrile with RVR at a rate of 156.  His labs are  pending, but the ABG shows 7.38/41.8/72/24.9.   ASSESSMENT/PLAN:  This is a 76 year old white male with a distant MI and  recent catheterization showing nonobstructive disease.  A recent  echocardiogram shows normal LV function.  His ECG  initially shows sinus  tachycardia and a second ECG with a poor baseline shows sinus  tachycardia that may degenerate into afib/RVR.  However, on interview,  he is in sinus tachycardia again at a rate of 150.  I suspect he is  extremely dehydrated and this will respond to fluid resuscitation.  His  blood pressure dropped acutely with 10 mg of IV diltiazem and this  supports hypovolemia.  I would avoid rate control until he has hydrated  fully; his ejection fraction should tolerate it.  If his heart rate does  not fall appropriately with hydration then a diltiazem drip can be  started at that time.  Please call with any questions.      Unice Cobble, MD  Electronically Signed     ACJ/MEDQ  D:  12/25/2007  T:  12/25/2007  Job:  [id number]

## 2011-01-25 NOTE — Assessment & Plan Note (Signed)
[facility]                         ELECTROPHYSIOLOGY OFFICE NOTE   NAME:[name], [name]                    MRN:          [id number]  DATE:03/13/2008                            DOB:          1935-08-19    Mr. [name] is seen following a hospitalization recently for pneumonia  which was complicated by atrial fibrillation.  .  Attempts were made to  cardiovert him and he did not hold sinus rhythm.  Because of that, he  was started on flecainide, was cardioverted again and has felt great  since he went home which was at the end of April.  He underwent Myoview  scanning for assessment of risk in the setting of his flecainide  therapy.  Unfortunately, he comes back with a perfusion defect and some  suggestive of peri-infarct ischemia.  The patient has had no  intercurrent chest pain.  His only complaint now is little bit of cough  again related to what he thinks his pneumonia.  He is to follow up with  his primary physician.   MEDICATIONS:  Include,  1. Coumadin.  2. Lasix.  3. Advair.  4. Flecainide 100 b.i.d.  5. Zocor.   PHYSICAL EXAMINATION:  On examination, his blood pressure was elevated  today at 161/85.  His pulse was 73.  His lungs were clear.  His heart  sounds were regular.  The femoral pulse was 2+.  Extremities were  without edema.   IMPRESSION:  1. Paroxysmal atrial fibrillation on flecainide.  2. Abnormal Myoview scan with question of infarct and peri-infarct      ischemia.  3. Hypertension.  4. Coumadin therapy.   [name] needs to undergo catheterization.  I am hoping given the  alternatives that his Myoview scan is a false positive.  In the event  that is a true positive, the flecainide will need to be discontinued and  alternative antiarrhythmic drug therapy will need to be considered,  although I might wait for his first clinical recurrence hoping that the  resolved pneumonia would be sufficient to decrease the  likelihood of  recurrent atrial fibrillation.   I will also need to keep an eye on his blood pressure and in the event  that it remains elevated, I would be begin him on an ACE inhibitor.   I have reviewed with him the potential benefits as well as potential  risks of heart catheterization.  He understands and is willing to  proceed.     Duke Salvia, MD, [facility]  Electronically Signed    SCK/MedQ  DD: 03/13/2008  DT: 03/14/2008  Job #: [id number]   cc:   [place]

## 2011-01-25 NOTE — Assessment & Plan Note (Signed)
[facility]                         ELECTROPHYSIOLOGY OFFICE NOTE   NAME:SORRELLHadley, Detloff                    MRN:          [id number]  DATE:03/25/2008                            DOB:          [birth year]    Mr. Rance was seen following Myoview scanning demonstrating  abnormalities in the setting of flecainide therapy for atrial  fibrillation.  He underwent catheterization, which demonstrated normal  left ventricular function, nonobstructive coronary artery disease, and  his flecainide has been maintained.   His biggest complaint is of the headache.   PHYSICAL EXAMINATION:  VITAL SIGNS:  His blood pressures remain  elevated.  They were 161 when I saw him in early July.  They are 159/79  today.  Pulse is 80.  LUNGS:  Clear.  HEART:  Heart sounds were regular.  EXTREMITIES:  Without edema.  ABDOMEN:  Soft with active bowel sounds.   Electrocardiogram dated today demonstrated sinus rhythm at 79 with  intervals of 0.22/0.11/0.39.   IMPRESSION:  1. Paroxysmal atrial fibrillation holding sinus rhythm, on flecainide.  2. False positive Myoview scan with normal catheterization.  3. Hypertension - systolic.  4. First-degree atrioventricular block - stable.   Mr. Crall is doing really very well at this time.  I am going to take  the liberty of starting him on lisinopril at 10 mg a day and this has  been sent to his pharmacy via E-chart.  When he comes in 2 weeks for his  Coumadin check, we will plan to check the BMET.  We did review the  potential risks, but I think that his previous exposure to Lotrel to be  associated with a likely tolerance to the medication.     Duke Salvia, MD, [facility]  Electronically Signed    SCK/MedQ  DD: 03/25/2008  DT: 03/25/2008  Job #: [id number]   cc:   Lindaann Pascal, PA

## 2011-01-25 NOTE — Discharge Summary (Signed)
[name], [name] NO.:  [id number]   MEDICAL RECORD NO.:  [id number]          PATIENT TYPE:  OBV   LOCATION:  2030                         FACILITY:  [facility]   PHYSICIAN:  Cody Salvia, MD, FACCDATE OF BIRTH:  Oct 09, 1934   DATE OF ADMISSION:  09/22/2008  DATE OF DISCHARGE:  09/23/2008                               DISCHARGE SUMMARY   PRIMARY CARDIOLOGIST:  Dr. Sherryl Manges.   PRIMARY [name]:  [name], nurse practitioner, at [facility].   DISCHARGE DIAGNOSIS:  Syncope.   SECONDARY DIAGNOSES:  1. History of paroxysmal atrial fibrillation status post      cardioversion, on flecainide and Coumadin.  2. Hypertension.  3. Chronic obstructive pulmonary disease with remote tobacco abuse.  4. Nonobstructive coronary artery disease.  5. Gastroesophageal reflux disease.  6. Hyperlipidemia.  7. History of lacunar cerebrovascular accident and transient ischemic      attacks.  8. Diastolic dysfunction/diastolic heart failure with normal left      ventricular function.  9. Medication nonadherence.   ALLERGIES:  NO KNOWN DRUG ALLERGIES.   PROCEDURES:  A 2D echocardiogram with results pending at the time of  dictation.   HISTORY OF PRESENT ILLNESS:  A 76 year old Caucasian male with prior  history of paroxysmal atrial fibrillation, currently on flecainide and  Coumadin therapy.  He was in his usual state of health until late last  week when while removing tires from his truck he became very dizzy with  what he describes as tunnel vision and subsequent loss of consciousness.  He apparently fell although additional details are not entirely clear as  patient is a poor historian.  He did not seek any evaluation and  apparently was not unconscious for long.  He called our Coumadin clinic  on September 22, 2008, after having no recurrent syncope over the weekend  and at the time complained of blood-tinged sputum and the subsequently  mentioned syncopal episode.  He was advised to present to the ED.  Patient had his grandson bring him into the ED and there reported some  bilateral upper quadrant discomfort that has been occurring since moving  those tires last week.  In the ER, CT of the head was negative.  His ECG  showed no acute changes and he was in sinus rhythm.  Chest x-ray was  normal.  His cardiac markers and LFTs were normal.  INR was 3.1.  Patient was admitted for observation.   HOSPITAL COURSE:  [name] ruled out for MI.  He has had no  additional symptoms of presyncope and has not had syncope.  There have  been no significant arrhythmias on the monitor.  He did undergo 2D  echocardiogram this morning.  At the time of this dictation, those  results are pending.  We have arranged for him to pick up a CardioNet  monitor in our office.  We will plan to discharge him home today.  He  has followup scheduled with Dr. Graciela Husbands on October 22, 2008, at 2:15 p.m.   DISCHARGE LABS:  Hemoglobin 12.9, hematocrit 38.4, WBC 8.0, platelets  253, INR 3.0, sodium 137, potassium 4.1, chloride 99, CO2 of 30, BUN 18,  creatinine 1.03, glucose 96, total bilirubin 0.8, alkaline phosphatase  58, AST 18, ALT 18, total protein 6.2, albumin 3.3, calcium 9.3, lipase  25.  Cardiac markers negative x4.  TSH 0.869.   DISPOSITION:  Patient will be discharged home today in good condition.   FOLLOWUP PLANS AND APPOINTMENTS:  We have asked him to follow up at  [facility] for Coumadin clinic later this week.  He is to follow up Dr. Graciela Husbands on October 22, 2008, at 2:15 p.m.  We have  also arranged for him to pick up a CardioNet heart monitor in our office  tomorrow.   DISCHARGE MEDICATIONS:  1. Nexium 40 mg daily.  2. Combivent 2 puffs every 4 hours p.r.n.  3. Flecainide 100 mg b.i.d.  4. Singulair 10 mg daily.  5. Lasix 40 mg b.i.d.  6. Guaifenesin 600 mg b.i.d.  7. Coumadin as directed.  8. Aspirin 81 mg  daily.  9. Zocor 40 mg q.h.s.   OUTSTANDING LAB STUDIES:  CardioNet monitoring and 2D echocardiogram.   DURATION OF DISCHARGE ENCOUNTER:  40 minutes including physician time.      [name], [name]      Cody Salvia, MD, [facility]  Electronically Signed    CB/MEDQ  D:  09/23/2008  T:  09/23/2008  Job:  [id number]   cc:   Cody Cradle, FNP

## 2011-01-25 NOTE — H&P (Signed)
NAMEAZAZEL, FRANZE NO.:  [id number]   MEDICAL RECORD NO.:  [id number]          PATIENT TYPE:  INP   LOCATION:  2313                         FACILITY:  [facility]   PHYSICIAN:  Lucita Ferrara, MD         DATE OF BIRTH:  [birth year]   DATE OF ADMISSION:  06/08/2008  DATE OF DISCHARGE:                              HISTORY & PHYSICAL   Patient is a 76 year old with chronic obstructive pulmonary disease on  oxygen at home presented with progressive worsening shortness of breath  since 10 p.m. on June 08, 2008, sometimes accompanied by pleuritic  chest pain, paroxysmal cough that is nonproductive.  He denies fever or  chills.  His symptoms are nowhere similar to episodes of chest pain that  which he has been experiencing in which he has had full workup on  June 06, 2008.  He had an ED evaluation with a working diagnosis of  COPD exacerbation.  He was started on oxygen and no BiPAP.   PAST MEDICAL HISTORY:  1. Significant for history of coronary artery disease.  2. Recent catheterization in March 17, 2008.  3. History of paroxysmal atrial fibrillation currently in sinus      rhythm.  4. History of nonobstructive coronary artery disease status post cath      July 2009 with left main only showing minor irregularities.  5. COPD on 3 L oxygen at home.  6. History of chronic diastolic heart failure.  Last 2-D      echocardiogram was dated December 17, 2007, which showed an ejection      fraction of 60% but inadequate for left ventricular wall motion.  7. History of lacunar cerebrovascular accident.  8. Hypertension.  9. History of tobacco abuse.  10.Gastroesophageal reflux disease.   SOCIAL HISTORY:  Denies drugs, alcohol, tobacco.  Supportive family,  lives at home.  Patient actually has a 120 pack year and quit 5 years  ago.   ALLERGIES:  No known drug allergies.   MEDICATIONS:  Not documented here in emergency room.  The medications  dated April 05, 2008, consistent  with:  1. Lasix 40 mg p.r.n.  Normally he takes a Lasix twice weekly,      supposedly.  2. Advair 50/250 b.i.d.  3. Flecainide 1 mg b.i.d.  4. Zocor.  5. Singulair.  6. Mucinex.   FAMILY HISTORY:  Noncontributory but mother died of a brain aneurysm at  age 66.  Father died of COPD, 25 and 2 sisters alive and well.   PHYSICAL EXAMINATION:  When I evaluated the patient, the patient is  oxygen but he is in no acute respiratory distress.  HEENT:  Normocephalic, atraumatic.  Sclerae is anicteric.  NECK:  Supple.  No JVD, no carotid bruits.  PERRLA.  Extraocular muscles intact.  CARDIOVASCULAR:  S1, S2, regular rate and rhythm.  No murmurs, rubs,  clicks.  ABDOMEN:  Soft, nontender, nondistended.  Positive bowel sounds.  LUNGS:  Actually clear to auscultation bilaterally.  EXTREMITIES:  No clubbing, cyanosis or edema.  Pulses 2+ bilaterally.   LABORATORIES:  Basic metabolic panel normal.  CBC normal.  Cardiac  enzymes pending.  Chest x-ray not performed.   ASSESSMENT AND PLAN:  The patient is a 76 year old with:  1. Questionable chronic obstructive pulmonary disease exacerbation.  2. History of diastolic congestive heart failure.  Seems to be      compensated.  3. History of nonobstructive coronary artery disease, negative      catheterization in April.  4. History of paroxysmal atrial fibrillation.  Normal sinus rhythm,      currently on Coumadin.   DISCUSSION AND PLAN:  We will go ahead and admit the patient to medical  telemetry unit and initiate albuterol Atrovent, cycle cardiac enzymes x3  every 8 hours.  Initiate Solu-Medrol for 24 hours, empiric antibiotic  x1, monitor heart rate given beta agonist.  We will check a B-type  natriuretic peptide, chest x-ray and INR not performed here in the  emergency room and we may proceed with cardiology consultation if there  is atrial fibrillation with a rate not controlled or positive cardiac  enzymes.  Strict input and output were  monitored for fluid status.      Lucita Ferrara, MD  Electronically Signed     RR/MEDQ  D:  06/09/2008  T:  06/09/2008  Job:  [id number]

## 2011-01-25 NOTE — Discharge Summary (Signed)
NAMEDREVON, PLOG NO.:  [id number]   MEDICAL RECORD NO.:  [id number]          PATIENT TYPE:  INP   LOCATION:  6742                         FACILITY:  [facility]   PHYSICIAN:  Herbie Saxon, MDDATE OF BIRTH:  [birth year]   DATE OF ADMISSION:  08/24/2008  DATE OF DISCHARGE:  08/28/2008                               DISCHARGE SUMMARY   DISCHARGE DIAGNOSES:  1. Chronic obstructive pulmonary disease exacerbation, improved.  2. Early pneumonia and bronchitis, improved on Avelox.  3. Leukocytosis, resolving.  4. Hypertension, stable.  5. Paroxysmal atrial fibrillation, stable.  6. Mild hyponatremia.  7. Hyperlipidemia, on statins.  8. Remote history of tobacco abuse.   RADIOLOGY:  The chest x-ray of August 27, 2008 shows mild left basal  opacity, central airway inflammation consistent with bronchitis.  The  chest x-ray on admission, August 24, 2008, shows early developing air-  space infiltrates in both lung bases, superimposed on chronic  interstitial changes.   HOSPITAL COURSE:  This 76 year old male presented with shortness of  breath, which has been worsening over the last 48 hours prior to  presentation, and not responding to his nebulizer at home.  The patient  was started initially on IV Rocephin and Zithromax for early pneumonia,  bronchodilators, and steroids.  The patient quickly improved within 72  hours of treatment and he is anxious to be discharged to home.  His  Rocephin, Zithromax was changed to Avelox.  However, the patient has  been cautioned that his Avelox might increase the INR, and he needs to  monitor his PT, INR at least twice weekly in the next 2 weeks with his  primary care physician as an outpatient.   DISCHARGE CONDITION:  Stable.   DIET:  Heart-healthy, low-cholesterol.   ACTIVITY:  To be increased slowly as tolerated.   FOLLOWUP:  He is to follow up with his primary care physician@  [facility] in the  next 3 days and primary care physician  to repeat the CBC and BMP in 2-3 days and to repeat PT, INR at least  twice weekly in the next 2 weeks to monitor the INR, therapeutic range 2-  3.   MEDICATIONS ON DISCHARGE:  1. Nexium 20 mg daily.  2. Lisinopril 5 mg daily.  3. Acetaminophen 500 mg q.8-12 h. as needed.  4. Allegra 180 mg daily.  5. Flecainide 100 mg b.i.d.  6. Prednisone taper 40 mg daily 4 days, 30 mg daily 4 days, 20 mg      daily 4 days, 10 mg daily 2 days, and discontinue.  7. Coumadin 4 mg daily except on Mondays and Saturdays, he is to take      2 mg on those days, on Monday and Saturday.  8. Cymbalta 30 mg daily.  9. Singulair 10 mg daily.  10.Combivent MDI 2 puffs q.6 h. as needed.  11.Avelox 180 mg daily for 5 more days __________ q.6 h. p.r.n.  12.Lasix 40 mg daily.  13.KCl 10 mEq daily.  14.Advair 250/50 two puffs b.i.d.  15.Aspirin 81 mg daily.   PHYSICAL EXAMINATION:  GENERAL:  On  examination, he is an elderly man,  no acute respiratory distress.  VITAL SIGNS:  Temperature is 98, pulse 69, respiratory rate is 20, and  blood pressure 128/71.  HEENT:  Pupils are equal, reactive to light and accommodation.  Head is  atraumatic and normocephalic.  Extraocular muscles are intact.  NECK:  Supple.  No elevated JVD.  CHEST:  Bilateral air entry is fair.  Reduced breath sounds at the bases  mild.  CARDIAC:  Heart sounds 1 and 2.  No gallop, no rubs.  Regular rate and  rhythm.  ABDOMEN:  Truncal obesity.  Bowel sounds present.  No organomegaly.  No  tenderness.  EXTREMITIES:  Peripheral pulses are present.  No pedal edema.  CENTRAL NERVOUS SYSTEM:  No neurological deficits.   LABORATORY DATA:  INR is 2.4, PT 27.8.  The BMP on August 27, 2008,  sodium was 134, potassium 4.2, chloride 96, bicarbonate 28, glucose 115  BUN 19, creatinine is 0.96.  Complete blood count on August 27, 2008,  WBC is 11.5, hematocrit 36.9, and platelet count is 219.    The  patient's illness, medications, treatment, and discharge plans were  explained to him.  He verbalizes understanding.  We will have the  patient continued on home O2, nebulizers as previously.  Home health  followup.      Herbie Saxon, MD  Electronically Signed     MIO/MEDQ  D:  08/28/2008  T:  08/29/2008  Job:  [id number]

## 2011-01-25 NOTE — Consult Note (Signed)
[name], [name] NO.:  [id number]   MEDICAL RECORD NO.:  [id number]          PATIENT TYPE:  INP   LOCATION:  3712                         FACILITY:  [facility]   PHYSICIAN:  Lyn Records, M.D.   DATE OF BIRTH:  [birth year]   DATE OF CONSULTATION:  11/15/2007  DATE OF DISCHARGE:                                 CONSULTATION   REASON FOR CONSULTATION:  Recurrent chest pain.   CONCLUSIONS:  1. Suspect coronary artery disease, probably multivessel in this      patient with a prior history of myocardial infarction 11 years ago      and recurring episodes of chest pressure over the past month, both      exertional and nonexertional.  2. History of esophageal reflux on proton pump inhibitor.  3. Hypertension.  4. Severe O2-requiring COPD.  5. Hyperlipidemia.  6. Prior transient ischemic attacks.   RECOMMENDATIONS:  1. Aspirin.  2. Serial markers have ruled out myocardial infarction, and therefore      no further tests were necessary.  3. Antithrombotic therapy in the form of __________  Lovenox.  4. Diagnostic coronary angiography will be best in this situation as      the patient is unable to exercise, he is not a good coronary CT      angio candidate because still high likelihood of coronary disease      and coronary calcification, and he is not an appropriate candidate      for adenosine-based myocardial perfusion imaging because of his      significant COPD.  5. Risks of cardiac catheterization and possible treatment modalities      were discussed with the patient and his son.  He will certainly not      be a very good surgical candidate.  May not even be a good      candidate for PCI if he has multivessel involvement, but we do not      know until we evaluate.  The risk of the procedure including      stroke, death, myocardial infarction, bleeding, among others, were      discussed with the patient and his son.   COMMENTS:  The patient was admitted to the  hospital on November 14, 2007,  after presenting to [place] with complaints of shortness of  breath and chest discomfort.  No acute changes were noted on EKG.  The  patient had prior stress test at [facility] years ago that  apparently was normal.  He has a history of a heart attack 11 years ago  but does not remember many details.  At that time, the chest pain was  much more severe than what he has been having over the past month or so.  He is currently pain free.   He stopped smoking approximately 6 years ago.  He does not drink.  Significant medical problems are listed above, most notably O2-dependent  COPD.   FAMILY HISTORY:  Father died of an MI in his 6s.  Mother died at 39 of  a ruptured brain aneurysm.  SOCIAL HISTORY:  Denies ethanol.  Denies illicit drugs.  Former smoker.   MEDICATIONS ON ADMISSION:  Ventolin, Norvasc, baby aspirin, Lotensin,  Protonix, prednisone, Zocor.   PHYSICAL EXAMINATION:  GENERAL:  On exam, the patient is lying  comfortably flat in bed O2 in his nose.  VITAL SIGNS:  Blood pressure 130/88, heart rate  98, respirations 16, O2  sat 95% on 3 L.  No carotid bruits.  Carotid upstroke is normal.  LUNGS:  Clear.  CARDIAC EXAM:  No rub, no click, no gallop.  ABDOMEN:  Soft.  Liver edge is not palpable.  No masses are noted.  No  bruits are heard.  EXTREMITIES:  Femorals 2+, 2+ radials, 2+ posterior tibials.  NEURO EXAM:  Grossly intact.   LABORATORY DATA:  Reveals point-of-care markers negative x2, creatinine  1.1, hemoglobin 15.7.   Chest x-ray:  COPD stable.  No acute change.   COMMENT:  The patient has had a significant weight loss over the past  year or so, I am not sure why.  Given his overall condition and prior  smoking history, I will be concerned about a mitotic process.  He has  also been complaining of some dysphagia.  Right now, it appears that he  is having unstable angina.  We need to do coronary angiography to define   anatomy.  He will certainly not be a very good candidate for drug-  eluting stents.  If he is a stent candidate, it should certainly be bare  metal.  Some further workup will be necessary to determine the cause of  weight loss.      Lyn Records, M.D.  Electronically Signed     HWS/MEDQ  D:  11/15/2007  T:  11/15/2007  Job:  [id number]   cc:   Paulita Cradle, NP

## 2011-01-25 NOTE — H&P (Signed)
NAMEBHARGAV, BARBARO NO.:  [id number]   MEDICAL RECORD NO.:  [id number]          PATIENT TYPE:  INP   LOCATION:  4713                         FACILITY:  [facility]   PHYSICIAN:  Della Goo, M.D. DATE OF BIRTH:  1935-03-21   DATE OF ADMISSION:  03/22/2009  DATE OF DISCHARGE:                              HISTORY & PHYSICAL   PRIMARY CARE PHYSICIAN:  Dr. Rudi Heap, [facility]   CHIEF COMPLAINT:  Shortness of breath   HISTORY OF PRESENT ILLNESS:  This is a 76 year old male who presents to  the emergency department with complaints of worsening shortness of  breath over the past 10 days.  He reports having a cough with scant  production.  Denies having any fevers, chills.  The patient denies  having any wheezes, but he does report having chest tightness and denies  having any frank chest pain.  The patient denies having any syncope,  nausea or vomiting but he does report having a poor appetite.  The  patient was last hospitalized June 23 through June 24, and at that time,  he was discharged on home O2 of 2 liters.   PAST MEDICAL HISTORY:  1. Paroxysmal atrial fibrillation.  2. Chronic anticoagulation.  3. Transient ischemic attacks.  4. COPD.  5. Hyperlipidemia.  6. Coronary artery disease.  7. Hyperlipidemia.  8. Gastroesophageal reflux disease.   PAST SURGICAL HISTORY:  1. History of a back surgery.  2. History of right inguinal hernia repair.  3. Two previous cardiac catheterizations.   MEDICATIONS:  1. Flecainide 100 mg p.o. b.i.d.  2. Diltiazem 120 mg one p.o. daily.  3. Coumadin 2 mg alternating with 1 mg every other day.  4. Simvastatin 40 mg p.o. at bedtime.  5. Lisinopril 10 mg p.o. daily.  6. Singulair 10 mg p.o. daily.  7. Combivent inhaler 2 puffs four times a day.  8. Nexium 40 mg p.o. daily.  9. Flovent 112 mcg daily.  10.Enteric-coated aspirin 81 mg p.o. daily.  11.Home oxygen 2 liters.  12.Lasix 20 mg  p.o. daily.  13.Potassium chloride 10 mEq p.o. daily.   SOCIAL HISTORY:  The patient has a remote history of smoking.  He  reports heavy smoking, smokes four packs cigarettes daily, but he quit 5  years ago.  He denies any alcohol or illicit drug usage.   FAMILY HISTORY:  Positive for coronary artery disease in his father who  also had COPD and his mother died of a ruptured cerebral aneurysm.   REVIEW OF SYSTEMS:  Pertinents mentioned above in HPI.  Although other  organ systems are negative.   PHYSICAL EXAMINATION FINDINGS:  GENERAL:  This is an elderly 76 year old  well-nourished, well-developed male in discomfort but no acute distress  currently.  VITAL SIGNS:  Temperature 98.0, blood pressure initially 140/88, now  127/65, heart rate 90, respirations 24, O2 sats 90% to 98%.  HEENT:  Examination normocephalic, atraumatic.  Pupils equally round reactive to  light.  Extraocular movements are intact.  Funduscopic benign.  There is  no scleral icterus.  Nares are patent bilaterally.  Oropharynx  is clear.  NECK:  Supple full range of motion.  No thyromegaly, adenopathy, jugular  venous distention.  CARDIOVASCULAR:  Regular rate and rhythm.  No murmurs, gallops or rubs.  LUNGS: Clear to auscultation, but decreased bilaterally.  There are no  rales, rhonchi or wheezes appreciated.  ABDOMEN:  Positive bowel sounds, soft, nontender, nondistended.  EXTREMITIES:  Without cyanosis, clubbing or edema.  Neurologic  examination the patient is alert and oriented x3.  Cranial nerves are  intact.  Motor and sensory function intact.  There are no focal deficits  on examination.   LABORATORY STUDIES:  White blood cell count 9.8, hemoglobin 14.9,  hematocrit 44.3, platelets 208, neutrophils 48%, lymphocytes 22%.  Sodium 142, potassium 4.0, chloride 101, carbon dioxide 32, BUN 14,  creatinine 1.08, glucose 103, albumin 4.5.  Point of care cardiac  markers with a myoglobin of 63.1, CK-MB less than  1.0, troponin less  than 0.05.  Chest x-ray reveals COPD changes but no acute findings.  EKG  reveals an accelerated junctional rhythm at a rate of 98.   ASSESSMENT:  A 75 year old male being admitted with:  1. Shortness of breath.  2. COPD exacerbation.  3. Paroxysmal atrial fibrillation.  4. Coagulopathy secondary to Coumadin therapy.  5. Hypertension.   PLAN:  The patient will be admitted to telemetry area for cardiac  monitoring.  The patient has been placed on IV steroid taper along with  nebulizer treatments with albuterol and Atrovent therapies.  His oxygen  has been increased.  The patient will be placed on a continuous pulse  oximetry and cardiac enzymes will be performed.  The patient's regular  medications have been verified and they have been continued.  GI  prophylaxis has also been ordered.      Della Goo, M.D.  Electronically Signed     HJ/MEDQ  D:  03/23/2009  T:  03/23/2009  Job:  [id number]

## 2011-01-25 NOTE — Cardiovascular Report (Signed)
NAMEDREDEN, RIVERE NO.:  [id number]   MEDICAL RECORD NO.:  [id number]          PATIENT TYPE:  OIB   LOCATION:  1962                         FACILITY:  [facility]   PHYSICIAN:  Rollene Rotunda, MD, FACCDATE OF BIRTH:  September 12, 1935   DATE OF PROCEDURE:  DATE OF DISCHARGE:                            CARDIAC CATHETERIZATION   PRIMARY:  Paulita Cradle, nurse practitioner.   CARDIOLOGIST:  Dr. Duke Salvia, MD, [facility].   PROCEDURE:  Left heart catheterization with selective coronary  arteriography.   INDICATIONS:  Evaluate the patient with abnormal Cardiolite, suggesting  inferior infarct with peri-infarct ischemia.  The patient is also being  treated with flecainide.   PROCEDURE NOTE:  Left heart catheterization was performed with a right  femoral artery.  The artery was cannulated using anterior wall puncture.  A #4-French arterial sheath was inserted via modified Seldinger  technique.  Preformed Judkins and a pigtail catheter were utilized.  The  patient tolerated the procedure well and left the lab in a stable  condition.   RESULTS:  Hemodynamics:  LV 170/32 and AO 158/84.  Coronaries:  Left main had luminal irregularities.  The LAD had diffuse  luminal irregularities.  There was a first diagonal, which was large  with luminal irregularities.  Second diagonal was small and normal.  The  circumflex had proximal long 25% stenosis.  There was a small ramus  intermediate, which was normal.  There was an obtuse marginal, which was  branching and normal.  The right coronary artery is a large dominant  vessel.  There is mid 25% stenosis.  The PDA was large and normal.  Posterolateral was large and moderate size and normal.  Left ventriculogram:  The left ventriculogram was obtained in the RAO  projection.  The EF 60% with normal wall motion.   CONCLUSION:  Nonobstructive coronary artery disease.  Well-preserved  ejection fraction.  There is a slight aortic valve  gradient.   PLAN:  Based on the above, no further cardiovascular testing is  suggested.  He will follow with Dr. Graciela Husbands.  He will continue his  flecainide.      Rollene Rotunda, MD, [facility]  Electronically Signed     JH/MEDQ  D:  03/17/2008  T:  03/17/2008  Job:  [id number]   cc:   Paulita Cradle, NP

## 2011-01-25 NOTE — H&P (Signed)
[name], [name] NO.:  [id number]   MEDICAL RECORD NO.:  [id number]          PATIENT TYPE:  INP   LOCATION:  1827                         FACILITY:  [facility]   PHYSICIAN:  Therisa Doyne, MD    DATE OF BIRTH:  07-23-1935   DATE OF ADMISSION:  03/04/2009  DATE OF DISCHARGE:                              HISTORY & PHYSICAL   PRIMARY CARE PHYSICIAN:  Dr. Christell Constant in [place], [place].   CHIEF COMPLAINT:  Left-sided chest pressure and cramping.   HISTORY OF PRESENT ILLNESS:  The patient is a 76 year old white male  with a past medical history significant for COPD on home oxygen,  paroxysmal atrial fibrillation on flecainide and Coumadin, who presents  with left-sided chest discomfort.  The patient reports that over the  past 2-3 days, he has had intermittent left-sided chest discomfort that  is described as a cramp.  These episode last approximately 2-3 minutes  and are somewhat unpredictable. They are associated with some mild  shortness of breath. Also notes that he has had increasing yellow sputum  but he denies any fevers. He does note, however, rattling in his  lungs. Because of this intermittent chest cramping he called emergency  medical services and was given nitroglycerin, which relieved his pain.  Cardiology consulted for possible admission.   REVIEW OF SYSTEMS:  All systems reviewed and are negative except as  mentioned in the history of present illness.   PAST MEDICAL HISTORY:  1. Paroxysmal atrial fibrillation on both flecainide and Coumadin.  2. Hypertension.  3. Chronic anticoagulation, as the patient has an elevated CHADS2      score of 4 for his age, hypertension, and TIA's.  4. TIA.  5. COPD, on home oxygen.  6. Nonobstructive coronary artery disease by cardiac catheterization,      July of 2009. The greatest lesion was less than 25% stenosed.  7. Gastroesophageal reflux disease.  8. Hyperlipidemia.   SOCIAL HISTORY:  The patient quit smoking  approximately 10 years ago. He  denies any alcohol use.   FAMILY HISTORY:  Positive for myocardial infarction as parents.   ALLERGIES:  No known drug allergies.   MEDICATIONS:  1. Simvastatin 40 mg daily.  2. Coumadin 2 mg alternating with 1 mg daily.  3. Lasix 20 grams daily.  4. Flecainide 100 mg b.i.d.  5. Combivent 1 puff q.i.d.  6. Singulair 10 mg daily.  7. Lisinopril 10 mg daily.  8. Diltiazem 120 mg daily.  9. Nexium 40 mg daily.  10.Flovent 100 mcg daily.  11.Aspirin 81 mg daily.   PHYSICAL EXAMINATION:  VITAL SIGNS:  Temperature afebrile.  Blood  pressure 107/58, pulse 62, respirations 18, oxygen saturation 92% on  admission, which has since increased to 100% on 3 liters nasal cannula.  GENERAL:  No acute distress.  HEENT: Normocephalic and atraumatic. Pupils are equal, round, and  reactive to light and accommodation. Oropharynx pink and moist without  lesions.  NECK:  Supple. No lymphadenopathy, no jugular distention.  No  masses.  CARDIOVASCULAR:  Regular, rate and rhythm.  No murmurs, rubs, gallops.  CHEST:  Decreased breath sounds at the bases.  No wheezing.  No  crackles.  ABDOMEN:  Positive bowel sounds. Soft, nontender, and nondistended.  EXTREMITIES:  No clubbing, cyanosis, or edema. Pulses 2+ bilaterally.  SKIN:  No rashes.  BACK:  No CVA tenderness.   LABORATORY DATA:  CNS, troponin less than 0.05. INR 2.6. CBC relatively  normal with a hemoglobin of 12.6 and white blood cell count of 7.9.  Normal renal function with BUN 11 and creatinine 0.8.   Chest x-ray: Initially the portable chest x-ray showed some questionable  bronchitic changes with possible pneumonia.  However, after getting a  good PA and lateral chest x-ray, there does not appear to be any acute  or chronic pulmonary disease.   EKG shows normal sinus rhythm at 64 beats per minute, first-degree AV  block, interventricular conduction delay, no evidence of ischemia. QTC  is measured at  442.   ASSESSMENT/PLAN:  Mr. Shellhammer is a 76 year old white male with past  medical history significant for hypertension, hyperlipidemia, TIA's and  paroxysmal atrial fibrillation, who presents for evaluation of left-  sided chest discomfort.   1. Will admit the patient to [facility]iology Service under Dr.      Odessa Fleming care.   1. Left-sided chest discomfort. I think this is unlikely to be an      acute coronary syndrome based on his history, his negative EKG, and      his negative biomarkers. More than likely, this is related to an      acute pulmonary process such as bronchitis. However he does have      risk factors for coronary disease and is established with      cardiology. We will go ahead and admit the patient and rule him out      for myocardial infarction with serial cardiac enzymes. His lateral      chest x-ray does not appear to show any acute pulmonary process,      however, I am still suspicous of an occult pulmonary process.  We      will continue him on aspirin and simvastatin.  I think that he      would benefit from a noninvasive stress evaluation.  Consider      Myoview study with adenosine or an exercise stress.  Also, as he      will likely undergo stress testing, we will hold the initiation of      a beta blocker.  Additionally he has a first-degree AV block and is      somewhat bradycardic, so we will hold the initiation of a beta-      blocker for this reason as well. At this time, there is no      indication for anticoagulation or a 2b3a inhibitor. I do not have      any evidence of a myocardial infarction.   1. Paroxysmal atrial fibrillation. Currently he is in normal sinus      rhythm.  Continue home medications of flecainide, diltiazem and      Coumadin.   1. COPD appears to be stable. Continue Combivent, Singulair and      Flovent.   1. Hypertension.  Appears to be well-controlled.  Continue home      medications.   1. FEN. SLIVF. NPO after  midnight.      Therisa Doyne, MD  Electronically Signed     SJT/MEDQ  D:  03/04/2009  T:  03/04/2009  Job:  [id number]  cc:   [facility]  Dr. Christell Constant - [place], [place]  Duke Salvia, MD, [facility]

## 2011-01-25 NOTE — Discharge Summary (Signed)
[name], [name] NO.:  [id number]   MEDICAL RECORD NO.:  [id number]          PATIENT TYPE:  INP   LOCATION:  3715                         FACILITY:  [facility]   PHYSICIAN:  Marcellus Scott, MD     DATE OF BIRTH:  1934-10-21   DATE OF ADMISSION:  12/24/2007  DATE OF DISCHARGE:  01/03/2008                               DISCHARGE SUMMARY   ADDENDUM:   PRIMARY MEDICAL DOCTOR:  [name], M.D.   This is an addendum to the discharge summary that was done by Dr. Lavera Guise  on January 01, 2008.  It will update events of care since January 01, 2008,  including discharge medications.   DISCHARGE DIAGNOSES:  1. Paroxysmal atrial fibrillation, now in sinus rhythm and      anticoagulated.  2. Chronic obstructive pulmonary disease exacerbation - resolved.  3. Leukocytosis, secondary to steroids - decreasing.  4. Haemophilus influenza pneumonia - resolved.  5. Diastolic congestive heart failure - compensated.  6. Hypertension - controlled.  7. History of lacunar stroke.  8. Hyperlipidemia.   DISCHARGE MEDICATIONS:  1. Enteric-coated aspirin 81 mg p.o. daily.  2. Coumadin 2 mg p.o. nightly.  Next appointment for INR check at      [facility] on Monday January 07, 2008, at 3:00 p.m.Marland Kitchen      Doses to be adjusted based on the INR follow-ups.  3. Lasix 20 mg p.o. daily.  4. Xopenex 0.63 mg nebulizations inhaled q.6h. p.r.n.  5. Advair discus 250/50 mg, one dose inhaled twice daily.  6. Xopenex 45 mcg per spray MDI, 2 puffs inhaled q.4-6h. p.r.n.  7. Flecainide 100 mg p.o. twice daily.  8. Prednisone taper per directions.  9. Oxygen via nasal cannula 4 liters per minute p.r.n.  10.Zocor 20 mg p.o. nightly.   MEDICATIONS THAT HAVE BEEN DISCONTINUED:  1. Albuterol nebulizations and inhaler.  2. Lotrel  3. Aspirin 325 mg.  4. Hydrochlorothiazide.   PROCEDURES:  None since January 01, 2008.   PERTINENT LABORATORY DATA:  INR today 2.5.  CBC:  Hemoglobin 14.1,  hematocrit 40.7, white blood cell 14, platelets 264.  Basic metabolic  panel unremarkable with BUN 13, creatinine 0.86.   CONSULTATIONS:  Continued consultations from [facility] EP, Dr. Sherryl Manges.   HOSPITAL COURSE AND DISPOSITION:  Overnight on the January 01, 2008, the  patient converted into normal sinus rhythm while on the Cardizem drip.  Flecainide had been started on the January 01, 2008.  The patient has  since continued to remain in normal sinus rhythm.  His Cardizem drip was  tapered down and discontinued yesterday.  The patient was scheduled for  direct current cardioversion this morning.  However, cardiology reviewed  him again today and considering that patient has remained in sinus  rhythm while on p.o. flecainide, his direct current cardioversion was  cancelled.  Cardiology has cleared the patient for discharge on p.o.  flecainide and anticoagulation.  They will follow him in the Coumadin  Clinic for anticoagulation and the patient has an appointment to follow  up with Dr. Sherryl Manges on Feb 06, 2008,  at 9:45 a.m.  They also have  recommended changing his bronchodilator nebulizations from albuterol to  Xopenex and recommended decaffeinated coffee and no soda.   The patient's breathing is at baseline.  The patient has been evaluated  by cardiac rehab and has done well with that.  He is to taper his  prednisone.  His bronchodilator inhalers and nebulizers are being  changed to Xopenex.  His leukocytosis is probably secondary to the  steroid effect and is decreasing.  This can be monitored as an  outpatient.  The patient has maintained good blood pressure controls  with systolics ranging between 119-135 mmHg and diastolics between 63  and 83 mmHg on current medications.      Marcellus Scott, MD  Electronically Signed     AH/MEDQ  D:  01/03/2008  T:  01/03/2008  Job:  [id number]   cc:   [name], M.D.  Duke Salvia, MD, [facility]

## 2011-01-25 NOTE — H&P (Signed)
[name], [name] NO.:  [id number]   MEDICAL RECORD NO.:  [id number]          PATIENT TYPE:  INP   LOCATION:  0113                         FACILITY:  [facility]   PHYSICIAN:  Pedro Earls, MD     DATE OF BIRTH:  04/28/1935   DATE OF ADMISSION:  11/19/2008  DATE OF DISCHARGE:                              HISTORY & PHYSICAL   ADMITTING TEAM:  [facility] team C.   CHIEF COMPLAINT:  Shortness of breath.   HISTORY OF PRESENT ILLNESS:  This is a 76 year old white male patient  with a past medical history significant for COPD, hypertension, atrial  fibrillation, who was apparently asymptomatic until 2 weeks ago when  patient started feeling short of breath and then started coughing.  The  patient's condition continued to deteriorate to the extent that today  patient was unable to stay even in his bed without getting short of  breath and had come to the ER for further evaluation and management.   Patient states that he takes oxygen at night and had been using it now  24 hours.  Patient is also using his inhalers more than normal.  Patient  stated that he has been coughing with yellow expectoration going on for  the past week.  He denies any chest pain, nausea or vomiting, any fever  or chills.   REVIEW OF SYSTEMS:  As above.  The rest of the review of systems are  negative.   PAST MEDICAL HISTORY:  1. COPD.  2. Hypertension.  3. Hypercholesterolemia.  4. GERD.  5. A fib.   PAST SURGICAL HISTORY:  Patient had back surgery done for fusion of L2-3  many years ago.   SOCIAL HISTORY:  An ex-smoker.  Quit 10 years ago.  Smoked 2-3 packs per  day for 50 years.  No alcohol.  No IV drug abuse.   FAMILY HISTORY:  Positive for diabetes and emphysema.   ALLERGIES:  NKDA.   MEDICATIONS:  1. Deconex IR 1 tab p.r.n.  2. Albuterol 2 puffs inhaler p.r.n.  3. Combivent 1 puff q.i.d.  4. Zocor 40 q.h.s.  5. Lasix 40 daily.  6. Amlodipine/Benazepril 5/20 daily.  7.  Singulair 10 daily.  8. Nexium 20 daily.  9. Flecainide 100 mg b.i.d.  10.Diltiazem ER 120 mg daily.  11.Advair Discus 250 one puff b.i.d.  12.Oxygen 2 liters at night.  13.Coumadin 4 mg p.o. daily.   PHYSICAL EXAMINATION:  VITALS:  Temperature 98.9, blood pressure 132/64,  pulse 91, respirations 24, pulse ox 95% on 2 liters.  Patient awake, alert and oriented x3.  Does appear to be in some  respiratory distress.  HEENT:  Pupils are equal, round and reactive to light.  There is no  pallor.  Extraocular movements are intact.  Mucosa are dry.  NECK:  Supple.  No JVD.  No thyromegaly.  No carotid bruits. Trachea is  midline.  CHEST:  Patient has bilateral expiratory and inspiratory wheezing  present anteriorly and posteriorly.  CVS:  S1 and S2.  Irregularly irregular.  ABDOMEN:  Soft, nontender.  Bowel sounds present.  Hepatosplenomegaly.  EXTREMITIES:  Pulses are present.  No clubbing, cyanosis or edema.  CNSx:  Motor and sensory grossly intact.  Cranial nerves II-XII are  intact.  SKIN:  No rashes.  MUSCULOSKELETAL:  Unremarkable.   LABS:  Patient's potassium is 2.9.  Glucose is 167, INR 2.  BNP is less  than 30.   Chest x-ray unremarkable.   EKG showed first-degree AV block with PVCs.  Normal sinus rhythm.   IMPRESSION:  1. Chronic obstructive pulmonary disease exacerbation.  2. Hypertension.  3. Hypercholesterolemia.  4. Atrial fibrillation.  5. Gastroesophageal reflux disease.   PLAN:  Admit to the med/surg inpatient under team C [facility].  I will  start patient on Levaquin IV as well as Solu-Medrol and Duonebs.  Continue oxygen.  Continue patient on Coumadin 4 mg daily.  Check PT/INR  in the a.m.  Also start patient on potassium.  Also, administer 40 mEq  of KCL due to hypokalemia.      Pedro Earls, MD  Electronically Signed     NS/MEDQ  D:  11/19/2008  T:  11/19/2008  Job:  [id number]

## 2011-01-25 NOTE — Discharge Summary (Signed)
NAMEAARONMICHAEL, BRUMBAUGH NO.:  [id number]   MEDICAL RECORD NO.:  [id number]          PATIENT TYPE:  INP   LOCATION:  3712                         FACILITY:  [facility]   PHYSICIAN:  Hillery Aldo, M.D.   DATE OF BIRTH:  [birth year]   DATE OF ADMISSION:  11/14/2007  DATE OF DISCHARGE:  11/16/2007                               DISCHARGE SUMMARY   PRIMARY CARE PHYSICIAN:  Dr. Rudi Heap with [facility].   DISCHARGE DIAGNOSES:  1. Noncardiac chest pain, status post cardiac catheterization,      essentially showing normal coronary arteries.  2. Hyperdynamic left ventricle.  3. Chronic obstructive pulmonary disease with acute exacerbation.  4. Hypertension.  5. Hyperlipidemia.  6. Remote history of tobacco abuse.  7. Cerebrovascular disease with old lacunar infarcts.  8. Questionable dysphagia.   DISCHARGE MEDICATIONS:  1. Lotrel 5/20 daily.  2. Advair 250/50, one inhalation b.i.d.  3. Aspirin 325 mg daily.  4. Hydrochlorothiazide 12.5 mg daily.  5. Zocor 20 mg q.h.s.  6. Albuterol metered-dose inhaler 2 puffs q.2 h. p.r.n.  7. Prednisone taper 40 mg to off over the next 4 days.   CONSULTATIONS:  [facility].   BRIEF ADMISSION HISTORY AND PHYSICAL:  The patient is a 76 year old male  who presented with a chief complaint of a one-month history of  increasing dyspnea, and recent onset of substernal chest pain.  It  apparently worsened on the day prior to admission, and he called his  primary physician.  The patient was evaluated and while at the  physician's office, began to have chest pain and therefore sent to the  emergency department for admission.  For full details, please see the  dictated report done by Dr. Lavera Guise.   PROCEDURES AND DIAGNOSTIC STUDIES:  1. Chest x-ray on November 14, 2007 showed no acute disease.  Chronic      pulmonary parenchymal changes which were stable.  2. Cardiac catheterization on November 15, 2007 showed  hyperdynamic left      ventricle with an ejection fraction of greater than 60.  3. Essentially normal coronary arteries.   DISCHARGE LABORATORY VALUES:  Sodium was 140, potassium 4.7, chloride  105, bicarb 31, BUN 17, creatinine 0.91, glucose 106.  White blood cell  count was 11, hemoglobin 13, hematocrit 38.7, platelets 167.   HOSPITAL COURSE:  By problem:  1. Chest pain:  The patient's chest pain resolved during the course of      his hospitalization.  He reported that his dyspnea was at his usual      baseline.  Given his coronary artery disease history and risk      factor profile, cardiology was consulted, and the patient underwent      a cardiac catheterization on November 15, 2007.  There was no evidence      of coronary artery disease.  A D-dimer was also checked and found      to be within the normal range, effectively ruling out pulmonary      embolism.  Recommendations by cardiology was to consider an  outpatient echocardiogram to evaluate the patient's right heart and      to rule out cor pulmonale and pulmonary hypertension.  The patient      wishes to go home and pursue an outpatient workup at this time.  2. Chronic obstructive pulmonary disease, acute exacerbation:  The      patient was empirically put on prednisone, nebulized bronchodilator      therapy, and supplemental oxygen.  This time, he reports that his      breathing is at his usual baseline.  He is instructed to take over-      the-counter Mucinex, to help with his cough.  3. Dyslipidemia:  The patient did have a fasting lipid panel checked,      which shows that his cholesterol was under good control on his      current dose of Statin therapy.  Specifically, his cholesterol was      132, triglycerides 82, HDL 33, LDL 83.  4. Hypertension:  The patient's blood pressure was well-controlled      throughout his hospital stay.  5. Cerebrovascular disease with old nuclear infarct:  The patient will      continue on  strict risk factor reduction, including control of      hypertension and lipids.  6. Questionable dysphagia:  The patient apparently reported to the      cardiologist that he had been having trouble swallowing.  When      asked about this on the date of discharge, the patient states that      he had a sore throat, but that it is now resolved.  She is      instructed to bring this to the attention of his primary care      physician, should he develop any further problems with difficulty      swallowing.   DISPOSITION:  The patient is medically stable and will be discharged  home.  Again, he should be followed up closely in the outpatient  environment with consideration for a referral to a cardiologist for a  echocardiogram to assess his right ventricle function and for cor  pulmonale.      Hillery Aldo, M.D.  Electronically Signed     CR/MEDQ  D:  11/16/2007  T:  11/17/2007  Job:  [id number]   cc:   [name], M.D.

## 2011-01-25 NOTE — Discharge Summary (Signed)
NAMETRYGVE, THAL NO.:  [id number]   MEDICAL RECORD NO.:  [id number]          PATIENT TYPE:  INP   LOCATION:  4704                         FACILITY:  [facility]   PHYSICIAN:  Duke Salvia, MD, FACCDATE OF BIRTH:  Aug 10, 1935   DATE OF ADMISSION:  03/04/2009  DATE OF DISCHARGE:  03/05/2009                               DISCHARGE SUMMARY   TIME FOR THIS DICTATION:  Greater than 40 minutes.   ALLERGIES:  This patient has no known drug allergies.   FINAL DIAGNOSES:  1. Admitted with chest pain, intermittent cramping, lasted 2-3      minutes, nitroglycerin does relieve the pain.      a.     Musculoskeletal versus ischemic derivation?      b.     Troponin I studies are 0.02, then 0.01, then 0.01.      c.     The patient given Naprosyn prescription for musculoskeletal       process.      d.     Dobutamine stress test scheduled for 11:15 in the morning on       March 05, 2009 at the [facility] office.      e.     INR is 3.2.  Follow up with Dr. Christell Constant.  The patient has not       taken Coumadin today.  2. The patient has been in sinus rhythm this admission.  3. Chest x-ray for a patient with increased expectoration of yellow      sputum showing basilar bronchial wall thickening.  Lungs otherwise      clear, but moderate changes of acute bronchitis with possible      bronchoalveolar pneumonia at the lung bases.  The patient receiving      10 days prescription for Avelox.   SECONDARY DIAGNOSES:  1. History of paroxysmal atrial fibrillation, on both flecainide and      Coumadin.  2. Hypertension.  3. History of TIAs.  He has a CHADs score of 4.  4. Chronic obstructive pulmonary disease, on home oxygen.  5. Nonobstructive coronary artery disease.  Cardiac catheterization in      July 2009.  6. Gastroesophageal reflux disease.  7. Dyslipidemia.   PROCEDURES THIS ADMISSION:  Chest x-ray as dictated above which  stimulated antibiotic therapy for a  10-day course.   BRIEF HISTORY:  Mr. Whitehurst is a 76 year old male.  He has a past  medical history which is significant for COPD.  He is on home oxygen.  He has paroxysmal atrial fibrillation.  He is being treated for this  with flecainide and Coumadin.  He comes in sinus rhythm, however.   He presents with left-sided chest discomfort.  He has had intermittent  discomfort described as a cramping over the past 2-3 days.  The episodes  last approximately 2-3 minutes.  They are associated with some mild  shortness of breath.  In addition, he notes increasing yellow sputum.  He denies fevers.  [facility] because of the  chest cramping.  He was given nitroglycerin.  This  relieved his pain.  Cardiology was consulted for possible admission.   It is unlikely that his left-sided chest discomfort is an acute coronary  syndrome.  He has negative biomarkers so far but he does have risk  factors for coronary artery disease.  We will admit the patient.  We  will rule out myocardial infarction.  He may have a pulmonary process  which we will treat for.  He may also benefit from a noninvasive stress  evaluation.  Once again currently, the patient is in normal sinus  rhythm.   HOSPITAL COURSE:  The patient was admitted with brief episodes of  cramping and chest pain.  These are relieved with nitroglycerin.  His  troponin I studies are negative.  His electrocardiogram is nondiagnostic  for acute myocardial infarction.  He has been given a prescription and  had been treated with Naprosyn here.  He was also started on Avelox for  pulmonary process which could represent an acute bronchitis or possible  basilar pneumonia.  He will discharge on March 05, 2009 to undergo a  dobutamine stress test at [facility] office at 11:15  in the morning.   DISCHARGE MEDICATIONS:  1. Naprosyn 250 mg 1 tablet twice daily.  This is new.  2. Simvastatin 40 mg daily at  bedtime.  3. Coumadin 1 mg to alternate with 2 mg.  His INR on the day of      discharge, however, is elevated at 3.2 and he is urged not to take      Coumadin today and to restart on Friday, March 06, 2009.  4. Lasix 20 mg daily.  5. Flecainide 100 mg twice daily.  6. Combivent metered-dose inhaler 2 puffs 4 times daily.  7. Singulair 10 mg daily.  8. Lisinopril 10 mg daily.  9. Diltiazem 120 mg daily.  10.Nexium 40 mg daily.  11.Flovent 100 mcg daily.  12.Enteric-coated aspirin 81 mg daily.  13.Home oxygen 14.  This is a new medication.  14.Avelox 400 mg.  He is to take this daily for the next 10 days and      it is a new medication.   I think we will add a low dose of potassium chloride of 10 mEq daily to  balance his Lasix.  This will also be a new medication.   FOLLOWUP:  He has a followup at [facility].  1. Protime, Tuesday, March 10, 2009 at 10:30.  2. Followup chest x-ray.  Hopefully, his expectoration of sputum will      have cleared.  This is for Tuesday, March 17, 2009 at 10:30.   LABORATORY STUDIES THIS ADMISSION:  Once again troponin 0.02, then 0.01,  and then 0.01.  Hemoglobin 12.6, hematocrit 36.9, white cells 7.9, and  platelets of 197.  Sodium is 143, potassium 3.4 has been replenished,  chloride 106, carbonate 29, BUN is 11, creatinine 0.84, and glucose is  104.  Protime at discharge is 35.1 and INR 3.2.  Lipid panel shows a  total cholesterol 126, LDL cholesterol 70, HDL cholesterol 36, and  triglycerides 99.  The BNP on admission was less than 30.  His TSH this  admission is 0.578.  Hemoglobin A1c is 5.4.      Maple Mirza, [place]      Duke Salvia, MD, [facility]  Electronically Signed    GM/MEDQ  D:  03/05/2009  T:  03/05/2009  Job:  [id number]   cc:   [name],  M.D. 

## 2011-01-25 NOTE — Discharge Summary (Signed)
NAMEJAQUAVIAN, FIRKUS NO.:  [id number]   MEDICAL RECORD NO.:  [id number]          PATIENT TYPE:  OBV   LOCATION:  2030                         FACILITY:  [facility]   PHYSICIAN:  Duke Salvia, MD, FACCDATE OF BIRTH:  1935-03-06   DATE OF ADMISSION:  09/22/2008  DATE OF DISCHARGE:  09/23/2008                               DISCHARGE SUMMARY   PRIMARY CARDIOLOGIST:  Duke Salvia, M.D.   PRIMARY CARE PHYSICIAN:  Birdena Jubilee, nurse practitioner at [facility].   ADDENDUM:  This is an addendum to the previously dictated discharge  summary, job [id number]. Please see that dictation for complete details.  Mr. Calzadilla in fact follows up in our Coumadin clinic, not at [facility], for his Coumadin adjustments. We have  arranged for him to follow up in our Coumadin clinic January 14 at 10:45  a.m.      Nicolasa Ducking, ANP      Duke Salvia, MD, [facility]  Electronically Signed    CB/MEDQ  D:  09/23/2008  T:  09/23/2008  Job:  [id number]

## 2011-01-25 NOTE — Cardiovascular Report (Signed)
[name], PALLAS NO.:  [id number]   MEDICAL RECORD NO.:  [id number]          PATIENT TYPE:  INP   LOCATION:  3712                         FACILITY:  [facility]   PHYSICIAN:  Lyn Records, M.D.   DATE OF BIRTH:  [birth year]   DATE OF PROCEDURE:  DATE OF DISCHARGE:                            CARDIAC CATHETERIZATION   INDICATIONS:  Recurring episodes of severe pressure in the chest at rest  and with exertion, intermittent over the last 4 to 6 weeks.  Discomfort  lasts 3 to 10 minutes.  He has a prior history of heart attack 11 years  ago, although no definite clinical documentation other than historical.  He had severe chronic obstructive lung disease that is O2 requiring and  was unable to exercise because of dyspnea, unable to have a  pharmacologic stress test due to COPD (adenosine would aggravate) and we  are unable to slow the heart rate with beta blockers because of COPD.  The most appropriate diagnostic procedure is coronary angiography.   PROCEDURE PERFORMED:  1. Left heart catheterization.  2. Coronary angiography.  3. Left ventriculography.   DESCRIPTION:  After informed consent, a 6-French sheath was placed in  the right femoral artery using a modified Seldinger technique.  This was  performed after 25 mcg of IV fentanyl and local 1% Xylocaine  infiltration.   A 6-French A2 multipurpose catheter was then used for hemodynamic  recordings, left ventriculography by hand injection, and selective left  and right coronary angiography.   Manual compression was used for hemostasis.   RESULTS:  1. Hemodynamic data:      a.     Aortic pressure 112/64.      b.     Left ventricular pressure 123/10.  2. Left ventriculography:  LVS hyperdynamic.  Heart rate is relatively      high.  Ejection fraction greater than 60%.  3. Coronary angiography:      a.     Left main coronary:  Widely patent.  Minimal luminal       irregularities.      b.     Left anterior  descending coronary:  Widely patent.  Luminal       irregularities noted in the proximal mid vessel with up to 30%       narrowing.  Large diagonal arises from the LAD.  No significant       obstructive lesions were noted.      c.     Ramus branch:  Large ramus branch arises from the distal       left main and is free of obstruction.      d.     Circumflex artery:  The circumflex coronary artery is       moderate in size, gives origin to trifurcating obtuse marginal       system.  Irregularities are noted proximally in the circumflex.       The vessel is otherwise normal.      e.     Right coronary:  The right coronary artery is large.  Irregularities are noted.  Vessel is large caliber.  PDA and 2       left ventricular branches are noted, the vessel is basically       normal.   CONCLUSION:  1. Essentially normal coronary arteries for the patient's age.  He      does have mild atherosclerosis noted in the proximal LAD, ramus,      circumflex, and right coronary, but nonobstructive.  2. Hyperdynamic left ventricle.  3. Chest discomfort, etiology uncertain.   RECOMMENDATIONS:  1. Work up weight loss and dysphagia.  2. IV fluids.  3. Check D-dimer.  4. No further cardiac workup other than to consider an echocardiogram      to rule out right heart enlargement/pulmonary hypertension that      would suggest cor pulmonale.      Lyn Records, M.D.  Electronically Signed     HWS/MEDQ  D:  11/15/2007  T:  11/15/2007  Job:  [id number]   cc:   Phill Myron, NP

## 2011-01-25 NOTE — H&P (Signed)
NAMEMarland Kitchen  ARMEND, HOCHSTATTER NO.:  [id number]   MEDICAL RECORD NO.:  [id number]          PATIENT TYPE:  EMS   LOCATION:  MAJO                         FACILITY:  [facility]   PHYSICIAN:  Lonia Blood, M.D.       DATE OF BIRTH:  January 07, 1935   DATE OF ADMISSION:  11/14/2007  DATE OF DISCHARGE:                              HISTORY & PHYSICAL   PRIMARY CARE PHYSICIAN:  [facility].   CHIEF COMPLAINTS:  Chest pain.   HISTORY OF PRESENT ILLNESS:  Mr. Slimp is a 76 year old gentleman who  went to his primary care doctor for a cold today.  He started having  some chest squeezing in the office and EKGs were done there which were  of very poor quality, indicate May be a brief run of ventricular  tachycardia.  Mr. Alvira received a nitroglycerin sublingually and his  chest pain promptly abated.  He was transferred via EMS to the [facility] emergency room.  The patient reports that for the past months he  has been having on and off brief episodes of chest squeezing, 10/10 in  intensity that are not related to effort.  There is no radiation.  No  alleviating or aggravating factors.  The patient has a remote history  myocardial infarction and he had a cardiac stress test done in January  2006 which only showed an apical scar.  The patient has strong family  history for coronary artery disease and he is a former tobacco abuser.   PAST MEDICAL HISTORY:  1. COPD.  2. Hypertension.  3. Lacunar stroke.  4. Hyperlipidemia.  5. Coronary artery disease.   HOME MEDICATIONS:  Albuterol and Atrovent nebulizers,  hydrochlorothiazide, Lotrel, Allegra and Mucinex.   ALLERGIES:  NO KNOWN DRUG ALLERGIES.   SOCIAL HISTORY:  The patient is married, works in his business, has  remote history of tobacco use.  He was smoking two packs daily for about  50 years.  He does not drink alcohol.   FAMILY HISTORY:  The patient's father died of heart attack at age 8.  The patient's  mother died at age 89 of brain aneurysm.  Brother died  with a brain tumor in his 30s.  She has three living siblings, one  sister with COPD, one brother alive of stroke.   REVIEW OF SYSTEMS:  As per HPI.  All other systems have been reviewed  and they are negative.   PHYSICAL EXAMINATION:  VITAL SIGNS:  Upon admission indicates  temperature 97.0, blood pressure 125/73, pulse 94, respirations 20,  saturation 94% on room air.  GENERAL APPEARANCE:  Well-developed, well-  nourished man in no acute distress, alert, oriented to place, person and  time.  HEAD:  Normocephalic, atraumatic.  Eyes:  Pupils equal and round, react  to light.  Extraocular movements intact.  Throat clear.  NECK:  Supple.  No JVD.  CHEST:  Barrel shaped.  There is some minimal wheezes and scattered  rhonchi, no crackles.  HEART:  Regular rate and rhythm without murmurs, rubs or gallops.  ABDOMEN:  Soft, nontender.  Bowel sounds  present.  LOWER EXTREMITIES:  Without edema.  SKIN:  Warm, dry without any suspicious rashes.  NEUROLOGICAL:  Cranial nerves II-XII are intact, sensation intact,  strength 5/5 in all four extremities.   LABORATORY VALUES ON ADMISSION:  EKG indicates a normal sinus rhythm  without any ST-T changes to suggest ischemia.  Portable chest x-ray  shows COPD.  White blood cell count is 11,000, hemoglobin 15, platelet  count 223, creatinine 1.1, sodium 137, potassium 4.6, chloride 105, BUN  14, creatinine is 1.1, glucose is 99, troponin I less than 0.05.  BNP  less than 30, myoglobin 115.   ASSESSMENT:  1. Chest pain - suggestive of angina.  With features though of      atypical angina due to the fact that it is not effort related.  He      also has typical features with a squeezing sensation and response      to nitroglycerin.  The patient definitely has a lot of risk factors      for coronary artery disease and even more so he has an apical scar      which indicates a remote myocardial  infarction.  I do think that      his symptoms would merit a full evaluation with a cardiac      catheterization for diagnostic and maybe therapeutic purposes.  For      this reason, we will observe the patient overnight on telemetry and      cycle three sets of cardiac enzymes.  Cardiology consultation will      be obtained in the morning.  I would add aspirin to his treatment      and continue the Zocor as before.  I agree that this patient is not      a good candidate for a beta blocker given his quite severe, chronic      obstructive pulmonary disease.  The patient will be continued on      Lotrel as before.  2. COPD exacerbation which is mild in nature.  My plan is to continue      the patient's nebulizer treatments and add low-dose prednisone.      Also, Mr. Goggins really should be on a proton pump inhibitor on a      daily basis to further control his respiratory symptoms.      Lonia Blood, M.D.  Electronically Signed     SL/MEDQ  D:  11/14/2007  T:  11/15/2007  Job:  [id number]

## 2011-01-28 NOTE — H&P (Signed)
NAMEMORGAN, RENNERT             ACCOUNT NO.:  [id number]   MEDICAL RECORD NO.:  [id number]          PATIENT TYPE:  INP   LOCATION:  4729                         FACILITY:  [facility]   PHYSICIAN:  Melissa L. Ladona Ridgel, MD  DATE OF BIRTH:  27-Dec-1934   DATE OF ADMISSION:  02/26/2006  DATE OF DISCHARGE:                                HISTORY & PHYSICAL   CHIEF COMPLAINT:  I can't get my breath.   PRIMARY CARE PHYSICIAN:  Dr.Slotnick.   HISTORY OF PRESENT ILLNESS:  The patient is a 76 year old white male with  chronic shortness of breath who over the last couple of days has had  worsening shortness of breath with congestion, cough with yellow sputum.  He  denies fever, nausea, vomiting, but has had no appetite.  He has not been  able to sleep secondary to waking up short of breath with coughing fits.  The patient's primary care physician had him on Mucinex and Advair.  He  recently was treated with a prednisone taper approximately 1 month ago.   REVIEW OF SYSTEMS:  Positive weight loss.  Negative fever, chills, nausea,  vomiting, diarrhea, or constipation.  He denies any dysuria, melena,  hematochezia.  He has had positive wheezing, PND, negative orthopnea.   PAST MEDICAL HISTORY:  Emphysema, hypertension, CAD, he did have SVT  approximately 1 year ago treated with adenosine and Cardizem.   PAST SURGICAL HISTORY:  He had back surgery and hernia surgery.   ALLERGIES:  NO KNOWN DRUG ALLERGIES.   MEDICATIONS:  1.  Advair 250/50 inhaled b.i.d.  2.  Lotrel 5/20 mg p.o. daily.  3.  Nexium 40 mg daily.  4.  Hydrochlorothiazide daily 12.5 mg.  5.  Last prednisone was one month ago.  6.  He uses Rhinocort daily.  7.  He has an Atrovent inhaler.   SOCIAL HISTORY:  He quit tobacco 5-6 years ago (was smoking since the age of  29 two to three packs a day), he denies ethanol, he is used to work for the  state, he does not do any illicit drugs.   FAMILY HISTORY:  Mom and dad are both  deceased.  Mom had emphysema.  Dad had  emphysema.   PHARMACY:  [facility] obtained [facility].   PHYSICAL EXAMINATION:  VITAL SIGNS:  Temperature is 96.3, blood pressure  122/67, pulse is 72, respirations 22, saturation 99%.  GENERAL:  This is a moderately developed white male in no acute distress.  HEENT:  He is normocephalic, atraumatic.  Pupils equal, round, reactive to  light.  Extraocular movements are intact.  Mucous membranes are moist.  NECK:  Supple.  There is no JVD, no lymph nodes, no carotid bruits and no  thyromegaly.  CHEST:  Decreased breath sounds bilaterally at the bases with end-expiratory  wheezing.  He has no dullness to percussion.  CARDIOVASCULAR:  Tachycardia, positive S1, S2, no S3, S4, no murmurs, rubs,  or gallops.  Heart sounds are distant.  ABDOMEN:  Mildly protruding, nontender, soft, with no hepatosplenomegaly and  no guarding or rebound.  EXTREMITIES:  No  clubbing, cyanosis, or edema.  NEUROLOGICAL:  He is awake, alert, oriented x3.  Cranial nerves 2-12 are  intact.  Power is 5/5.  DTRs are 2+.  Plantars are downgoing.   LABORATORY DATA:  White count is 11.3, hemoglobin is 14.6, hematocrit is  43.4, and platelets are 251.  Sodium is 136, potassium is 4.3, chloride 99,  CO2 is 28, BUN is 11, creatinine is 1.0, glucose is 111, magnesium is 2.1.  BNP is less than 30.  EKG shows 90 beats per minute with so ST-T wave  changes.  Chest x-ray reviewed and is consistent with COPD, emphysema, but  there is hyperinflation with no infiltrate.   ASSESSMENT AND PLAN:  This is a 76 year old white male with a long-standing  tobacco history who presents with 3 hours of worsening shortness breath with  associated chest pain that is diffuse in nature and is associated more with  cough.   Problem 1. PULMONARY.  Chronic obstructive pulmonary disease exacerbation.  We will start him on Avelox and aggressive pulmonary toilet with nebulizer,  steroids  and a flutter valve.  We will continue his Nexium.   Problem 2. CARDIOVASCULAR.  He has history of supraventricular tachycardia.  He is currently tachycardic but normal sinus rhythm.  We will monitor him on  telemetry.  Home meds have been clarified with the [facility].  He is on no beta blocker or calcium channel blocker at this time other than  amlodipine.   Problem 3. GASTROINTESTINAL.  We will continue his proton pump inhibitor  while on steroids.   Problem 4. GENITOURINARY.  He has no current complaints but we will check a  UA/C&S.   Problem 5. ENDOCRINE.  We will sliding scale insulin while on steroids.   Problem 6. DEEP VEIN THROMBOSIS.  Prophylaxis will be with Lovenox.      Melissa L. Ladona Ridgel, MD     MLT/MEDQ  D:  02/26/2006  T:  02/26/2006  Job:  [id number]   cc:   Caryl Comes. Slotnick, M.D.  Fax: [phone]

## 2011-01-28 NOTE — Discharge Summary (Signed)
NAMECLERENCE, [name] NO.:  [id number]   MEDICAL RECORD NO.:  [id number]          PATIENT TYPE:  INP   LOCATION:  4704                         FACILITY:  [facility]   PHYSICIAN:  Duke Salvia, MD, FACCDATE OF BIRTH:  [birth year]   DATE OF ADMISSION:  03/04/2009  DATE OF DISCHARGE:  03/05/2009                               DISCHARGE SUMMARY   Time for this dictation greater than 35 minutes.  The patient has  sensitivity to ACE inhibitors.   1. Admitted with left-sided chest discomfort described as cramping.  2. He has ruled out here for acute myocardial infarction.  3. The patient has acute on chronic bronchitis with productive yellow      sputum but not fluids.  4. Left chest wall tenderness to pressure.  5. Patient discharging June 24 for outpatient dobutamine stress test      at [facility].   SECONDARY DIAGNOSES:  1. History of paroxysmal atrial fibrillation on both flecainide and      Coumadin.  2. Hypertension.  3. Chronic anticoagulation.  4. History of transient ischemic attacks.  5. Chronic obstructive pulmonary disease, on home oxygen.  6. History of nonobstructive coronary artery disease by      catheterization July 2009, greatest lesion was 25% stenosis.  7. Gastroesophageal reflux disease.  8. Dyslipidemia.  9. Strong family history of myocardial infarction in both parents.   PROCEDURES:  No procedures this admission.  The patient had lab work.  He was placed on telemetry.  Troponin I studies were 0.02, then 0.01,  and then 0.01.   The patient was given a prescription for Naprosyn to cover  musculoskeletal chest wall pain and as mentioned above set up for  dobutamine stress test.   MEDICATIONS AT DISCHARGE:  1. Simvastatin 40 mg daily.  2. Coumadin 2 mg daily.  3. Furosemide 20 mg daily.  4. Flecainide 100 mg twice daily.  5. Combivent metered-dose inhaler as needed.  6. Diltiazem 120 mg daily.  7. Nexium 40 mg daily.  8. Flovent  100 mg inhalation as needed.  9. Enteric-coated aspirin 81 mg daily.  10.Lisinopril 10 mg daily.  11.Naprosyn 250 mg twice daily.  12.Singulair 10 mg daily.   The patient has followup chest x-ray at [facility] on Tuesday, July 6 at 10:30.   LABORATORY STUDIES THIS ADMISSION:  Complete blood count; white cells  7.9, hemoglobin 12.6, hematocrit 36.9, and platelets are 197.  Electrolytes; sodium 143, potassium 3.4 which was replenished, chloride  106, carbonate 29, glucose 104, BUN is 11, and creatinine 0.84.  Hemoglobin A1c was 5.4.  Lipid profile; cholesterol 126, triglyceride  99, HDL cholesterol 36, and LDL cholesterol of 70.  TSH this admission  0.578.      Maple Mirza, [place]      Duke Salvia, MD, [facility]  Electronically Signed    GM/MEDQ  D:  04/16/2009  T:  04/17/2009  Job:  [id number]   cc:   [name], M.D.

## 2011-01-28 NOTE — Discharge Summary (Signed)
Cody, [name] NO.:  [id number]   MEDICAL RECORD NO.:  [id number]          PATIENT TYPE:  INP   LOCATION:  4729                         FACILITY:  [facility]   PHYSICIAN:  Jackie Plum, M.D.DATE OF BIRTH:  Sep 09, 1935   DATE OF ADMISSION:  02/26/2006  DATE OF DISCHARGE:  03/02/2006                                 DISCHARGE SUMMARY   DISCHARGE DIAGNOSES:  1.  Chronic obstructive pulmonary disease exacerbation, resolved.  2.  History of hypertension.  3.  Coronary artery disease.  4.  Previous history of supraventricular tachycardia.   DISCHARGE MEDICATIONS:  The patient is to continue his Advair, Lotrel,  Nexium, hydrochlorothiazide, Rhinocort and Atrovent as previously.  Please  see H&P by Dr. Derenda Mis.  New medicine is Mucinex 600 mg 2 tablets  b.i.d. and Avelox 400 mg daily.  He was also prescribed him to have  Combivent MDI 2 puffs b.i.d. and steroid taper.   CONSULTATIONS:  Not applicable.   PROCEDURES:  Not applicable.   CONDITION ON DISCHARGE:  Improved and satisfactory.   The patient is a 76 year old gentleman who presented with shortness of  breath with congestion, cough productive of yellowish sputum, without any  nausea, vomiting, fever or chills.  His appetite had been poor, however, and  had not been able to sleep.  On admission, the patient was hemodynamically  stable and his cardiopulmonary auscultation revealed decreased breath sounds  bilaterally with end expiratory wheezes and tachycardia without any gallops.  X-ray was negative for any acute infiltrate, but was consistent with COPD.  He was therefore admitted for COPD exacerbation and treatment.  Please see  full details regarding presentation by reviewing H&P by Dr. Derenda Mis.   On admission, the patient was started on steroids, bronchodilator  nebulizations, expectorants and antitussives.  He was slow to improvement  and follow up x-rays did not reveal any pneumonic  process.  He was started  on flutter valve and gradually the patient's overall pulmonary status  improved.  His O2 saturation on room air after ambulation had been 92% and  he is deemed appropriate for discharge today.  On rounds today, patient  feels fine, no weakness, no shortness of breath, no chest pain, no fever or  chills.  He has been ambulatory without any significant problems.  His  hemodynamics remain stable.  His lungs revealed increased fascicular breath  sounds without any wheezes and  he is for discharge today and to follow up with is PCP, Dr. __________, in  about 2 weeks.  The patient is to call for appointment and he was given a  prescription for his new medications and any changes made as mentioned  above.  He was discharged in stable satisfactory condition.      Jackie Plum, M.D.  Electronically Signed     GO/MEDQ  D:  03/02/2006  T:  03/02/2006  Job:  [id number]

## 2011-01-28 NOTE — Discharge Summary (Signed)
[name], [name] NO.:  [id number]   MEDICAL RECORD NO.:  [id number]          PATIENT TYPE:  INP   LOCATION:  3002                         FACILITY:  [facility]   PHYSICIAN:  [name], M.D.       DATE OF BIRTH:  05/18/1935   DATE OF ADMISSION:  05/05/2006  DATE OF DISCHARGE:  05/07/2006                                 DISCHARGE SUMMARY   PRIMARY CARE PHYSICIAN:  Franklyn Lor, MD with the [facility]   DISCHARGE DIAGNOSES:  1. Transient ischemic attack.  2. 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  3. Hyperlipidemia.  4. Hypertension.  5. Chronic obstructive pulmonary disease.  6. History of tobacco abuse.   DISCHARGE MEDICATIONS:  1. Lotrel 5/20 mg daily.  2. Advair 250/50 one inhalation twice a day.  3. Aspirin 325 mg daily.  4. Hydrochlorothiazide 12.5 mg daily.  5. Albuterol as needed.  6. Simvastatin 20 mg at bedtime.   CONDITION AT DISCHARGE:  The patient was discharged in good condition.  At  the time of discharge she did not have any neurological deficits.  She was  alert and oriented to place, person, and time.  At the time of discharge he  was instructed to limit his outside activities in hot weather.  The patient  was also instructed to followup with his primary care physician next week.  Also an outpatient carotid ultrasound will be scheduled through the [facility] and the results will be faxed to Dr. Morrie Sheldon with [facility].   PROCEDURES DURING THIS ADMISSION:  1. The patient underwent a computerized tomography scan of the head      without contrast on May 06, 2006 showing old lacunar strokes, but no      definite acute findings.  Per the report the lacunar infarcts are in      the external capsule.  2. MRI/MRA of the brain done on May 06, 2006 that was essentially      negative for intracranial atherosclerotic lesions and no acute      abnormalities.  Old lacunar stroke seen  on the CT were unchanged.   For admission history and physical refer to the dictated H&P done by Dr.  Darnelle Catalan, May 05, 2006.   HOSPITAL COURSE BY PROBLEMS:  Problem #1:  TRANSIENT ISCHEMIC ATTACK.  Mr.  Morrish presented to the emergency room with complaints that were definitely  worsened for transient ischemic attack.  The patient was admitted for  further neurological observation and monitoring.  [name] was kept on 24-  hour telemetry and he did not have any bouts of atrial fibrillation.  Also  the patient had a fasting lipid profile checked and he was found to be  borderline hyperlipidemic. Given the fact that he has old lacunar CVAs we  will proceed with treatment for his hyperlipidemia in the form of Zocor 20  mg at bedtime.  Of note, the patient's LDL was 111.  To further evaluate the  patient's symptoms we have obtained an MRI/MRA of the brain which failed to  show any  acute pathology or significant intracerebral atherosclerosis.  We  will complete the workup for the symptoms of this patient as an outpatient  with carotid Dopplers.  The vascular lab at [facility] will contact  the patient with the exact dates and time.   Problem #2:  HYPERTENSION.  The medications of the patient will continue  without any alterations.   Problem #3:  COPD.  The patient did not have any acute exacerbation during  this period of stay in the hospital.  He was continued on Advair twice a day  without any changes.      [name], M.D.  Electronically Signed     SL/MEDQ  D:  05/07/2006  T:  05/07/2006  Job:  [id number]   cc:   Franklyn Lor, MD

## 2011-01-28 NOTE — H&P (Signed)
NAMEJONNIE, TRUXILLO NO.:  [id number]   MEDICAL RECORD NO.:  [id number]          PATIENT TYPE:  EMS   LOCATION:  MAJO                         FACILITY:  [facility]   PHYSICIAN:  Corinna L. Lendell Caprice, MDDATE OF BIRTH:  [birth year]   DATE OF ADMISSION:  10/02/2004  DATE OF DISCHARGE:                                HISTORY & PHYSICAL   CHIEF COMPLAINT:  Heart racing.   HISTORY OF PRESENT ILLNESS:  Mr. Cisse is a 76 year old white male who has  no primary care physician but presents to the emergency room via EMS with  complaints of his heart racing and chest pressure.  It started about noon  yesterday.  He feels his heart racing on and off but he reports it has never  raced for this long, nor has it been accompanied by any chest pressure or to  this extent in terms of repetity.  He reports that he was seen in an  emergency room five years ago for some heart issue and he was given  nitroglycerin and sent home.  He has not seen a primary care physician in  over 20 years.  He has a business hauling tires and he has no exertional  chest pain.  Tonight he felt dizzy and short of breath.  He had no nausea.  He has no chest pain or dyspnea currently.  EMS reportedly gave the patient  some adenosine IV, 6 mg and then 12 mg.  Subsequently he got 20 mg of  Cardizem and he converted to sinus rhythm.   PAST MEDICAL HISTORY:  None.   MEDICATIONS:  None.   ALLERGIES:  None.   SOCIAL HISTORY:  The patient quit smoking two years ago.  He had been  smoking two packs of cigarettes a day.  He does not drink.  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   FAMILY HISTORY:  His father had an MI at age 55.  He also had emphysema.  His mother died of a brain aneurysm.  A brother died of a brain tumor in his  48's.   REVIEW OF SYSTEMS:  As above. Otherwise negative.   PHYSICAL EXAMINATION:  VITAL SIGNS:  Temperature 97.9, blood pressure  139/93, pulse 100, oxygen saturation  97%, respiratory rate 20.  GENERAL:  The patient is well-nourished, well-developed, in no acute  distress.  HEENT:  Normocephalic, atraumatic.  Pupils are equal, round and reactive to  light.  Sclerae nonicteric.  Moist mucous membranes.  NECK:  Supple.  No carotid bruits. No thyromegaly.  LUNGS:  Clear to auscultation bilaterally without wheezes, rhonchi or rales.  CARDIOVASCULAR:  Regular rate and rhythm without murmurs, gallops, or rubs.  ABDOMEN:  Normal bowel sounds, soft, nontender, nondistended.  GU/RECTAL:  Deferred.  EXTREMITIES:  No clubbing, cyanosis or edema..  Pulses are intact.  NEUROLOGIC: Alert and oriented.  Cranial nerves and sensory motor exam are  intact.  PSYCHIATRIC:  Normal affect.   LABORATORY DATA:  Hemoglobin 15, hematocrit 45.  INR 1, PTT 31. D-dimer  0.41.  Basic metabolic panel is normal.  Two sets  of point-of-care enzymes  are normal.  EKG when the patient arrived shows normal sinus rhythm.  The  monitor from the ambulance shows SVT with a rate of about 190.  Chest x-ray  negative to my reading.   ASSESSMENT/PLAN:  1.  Supraventricular tachycardia with rate-related chest pain. Currently the      patient is in sinus rhythm after getting adenosine and Cardizem.  He has      no chest pain currently. I will cycle cardiac enzymes, give aspirin.  I      will place the patient on Metoprolol, get an echocardiogram, check a      TSH.  He will need a primary care physician.  He will be on oxygen until      he rules out for myocardial infarction.      CLS/MEDQ  D:  10/03/2004  T:  10/03/2004  Job:  [id number]

## 2011-01-28 NOTE — Op Note (Signed)
[name], BRULE NO.:  [id number]   MEDICAL RECORD NO.:  [id number]          PATIENT TYPE:  AMB   LOCATION:  DAY                          [facility]   PHYSICIAN:  [name], M.D.DATE OF BIRTH:  05/26/1935   DATE OF PROCEDURE:  10/05/2005  DATE OF DISCHARGE:                                 OPERATIVE REPORT   PREOPERATIVE DIAGNOSIS:  Right inguinal hernia.   POSTOPERATIVE DIAGNOSIS:  Right inguinal hernia   SURGICAL PROCEDURES:  Repair of right inguinal hernia.   SURGEON:  [name], M.D.   ANESTHESIA:  Local with IV sedation.   BRIEF HISTORY:  [name] is a 76 year old male who presents with a very  painful bulge in his right groin. Examination reveals a tender, reducible  right inguinal hernia.  I have recommended proceeding with repair and  options were discussed.  We have elected to proceed with open repair with  mesh under local anesthesia was sedation as an outpatient.  The nature of  the procedure, indications, risks of bleeding, infection, recurrence, and  chronic pain were discussed with him and he understood.  He is now brought  to the operating room for this procedure.   DESCRIPTION OF OPERATION:  The patient was brought to the operating room and  placed in the supine position on the operating table.  IV sedation was  administered.  The right groin was sterilely prepped and draped.  He  received preoperative IV antibiotics.  Correct patient and procedure were  verified.  Local anesthesia was used to block the ilioinguinal nerve and  infiltrate the area of the incision in the right groin.  An oblique incision  was made and dissection carried down through the subcutaneous tissue  sharply. Crossing veins were doubly clamped, divided, and ligated with 3-0  Vicryl.  The external oblique was identified and dividd along it's fibers to  the external ring.  The cord was dissected off the floor at the pubic  tubercle and  cremasteric fibers were divided bilaterally, completely freeing  the cord up to the internal ring.  The floor of the inguinal canal was  intact.  There was a large indirect sac running with the cord structures  that was dissected free using blunt and cautery dissection.  The sac was  opened and there was a sliding component of probably appendix epiploica from  the cecum which was dissected away from the sac and reduced into the  abdominal cavity. The sac was then suture-ligated with 2-0 silk at the  internal ring.  A good-sized cord lipoma was also dissected free from the  cord structures with blunt and cautery dissection, clamped at the internal  ring and divided and ligated with 3-0 Vicryl.  Following this, a piece of  Priortex mesh was trimmed to size to fit the floor of the canal with tails  to go around  the internal ring.  It was sutured initially to the pubic  tubercle and then to the iliopubic tract and inguinal ligament, working  medial to lateral with running 2-0 Prolene. Medially, the mesh was sutured  to the edge of the rectus sheath with interrupted 2-0 Prolene.  The tails  were then tacked together lateral to the cord with interrupted 2-0 Prolene,  creating a new internal ring snug to the fingertip.  This provided nice  broad coverage of the direct and indirect spaces.  The cord was returned to  its anatomic position.  The  external oblique was closed over this with running 3-0 Vicryl.  Scarpa's  fascia was closed with running 3-0 Vicryl and the skin with running  subcuticular 4-0 Monocryl and Steri-Strips.  Sponge, needle and instrument  counts were correct.  Dressings were applied and the patient was taken to  the recovery room in good condition.      [name], M.D.  Electronically Signed     BTH/MEDQ  D:  10/05/2005  T:  10/06/2005  Job:  [id number]

## 2011-01-28 NOTE — H&P (Signed)
NAMEKAIMANI, CLAYSON NO.:  [id number]   MEDICAL RECORD NO.:  [id number]          PATIENT TYPE:  INP   LOCATION:  3002                         FACILITY:  [facility]   PHYSICIAN:  Hillery Aldo, M.D.   DATE OF BIRTH:  [birth year]   DATE OF ADMISSION:  05/05/2006  DATE OF DISCHARGE:                                HISTORY & PHYSICAL   PRIMARY CARE PHYSICIAN:  Dr. Morrie Sheldon of [facility].   CHIEF COMPLAINT:  Intermittent right blindness.   HISTORY OF PRESENT ILLNESS:  The patient is a 76 year old male with a past  medical history of hypertension and coronary artery disease, who presents  with a 2-week history of episodic visual changes.  He describes the visual  changes as being similar to closing his eyes.  These changes can last  anywhere from 1-15 minutes at a time.  He has had multiple episodes and they  have been increasing in frequency.  He became concerned after having to stop  driving 4 times today because of these visual changes.  He also noticed some  transient right-sided numbness today.  The patient states that the visual  loss is associated with diaphoresis and shortness of breath.  Currently, he  denies any visual changes or numbness.   PAST MEDICAL HISTORY:  1. COPD.  2. Hypertension.  3. Coronary artery disease.  4. Hospitalization for supraventricular tachycardia.  5. History of back surgery.  6. History of right inguinal hernia repair in January 2007.   FAMILY HISTORY:  The patient's father died at 53 of an acute MI.  He also  had COPD.  Mother died at age 88 from a brain aneurysm.  One brother died of  a brain tumor in his 30s.  He has 3 living siblings, 1 sister with COPD, 1  brother who is alive, status post stroke.   SOCIAL HISTORY:  The patient is married.  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  He has a history of a 2-pack-per-  day tobacco habit, but quit approximately 3 years ago.  No  alcohol.   ALLERGIES:  None.   CURRENT MEDICATIONS:  1. Lotrel 5/20 daily.  2. Advair 250/50 one inhalation b.i.d.  3. Albuterol two puffs q.i.d. p.r.n.  4. Atrovent two puffs q.i.d. p.r.n.  5. Hydrochlorothiazide 12.5 mg daily.   REVIEW OF SYSTEMS:  The patient denies any fever or chills.  Appetite is  okay.  Weight has been stable.  He has had some intermittent chest pain  associated with dry cough.  He has dyspnea on exertion.  No orthopnea.  No  changes in his bowel habits.  No melena or hematochezia.  No dysuria.   PHYSICAL EXAM:  VITAL SIGNS:  Temperature 98.6, pulse 85, respirations 20,  blood pressure 141/81, O2 saturation 96% on room air.  GENERAL:  Well-  developed, well-nourished male in no acute distress.  HEENT: Normocephalic, atraumatic.  PERRL.  EOMI.  Visual fields are full.  Oropharynx is clear.  NECK:  Supple, no thyromegaly, no lymphadenopathy, no  jugular venous distension.  CHEST:  Decreased breath sounds at bases, otherwise clear.  HEART:  Regular rate, rhythm.  No murmurs, rubs, or gallops.  ABDOMEN:  Soft, nontender, nondistended with normoactive bowel sounds.  EXTREMITIES:  No clubbing, edema, or cyanosis.  SKIN:  Warm and dry.  No rashes.  NEUROLOGIC:  The patient is a alert and oriented x3.  Cranial nerves II-XII  are grossly intact.  He has 5/5 upper and lower extremity strength.  He does  have some left paresthesias in the great toe and dorsum of the foot.   DATA REVIEW:  CT scan shows probable subacute or chronic ischemic changes in  the central midbrain, inferior pons, and pontomesencephalic region.  Mild  small vessel white matter ischemic changes in both cerebral hemispheres.  Old bilateral external capsule lacunar infarcts.  Mild diffuse cerebral  atrophy.  No intracerebral hemorrhage or mass effect.   Chest x-ray shows chronic lung changes and COPD.   LABORATORY DATA:  CBC shows a white blood cell count of 7.1, hemoglobin  13.1, hematocrit  38.1, platelet count 258,000 with an absolute neutrophil  count of 4.1.  Sodium is 140, potassium 3.7, chloride 104, bicarb 29, BUN 7,  creatinine 0.8, glucose 95, calcium 9.5, total protein 6, albumin 3.8, AST  19, ALT 9, alkaline phosphatase 53, total bilirubin 0.4.   ASSESSMENT AND PLAN:  1. Transient ischemic attack:  The patient's symptoms are suspicious for a      transient ischemic attack.  We will therefore admit him for a full      stroke workup including MRI/MRA of the brain, 2-D echocardiogram,      looking for embolic sources, and carotid ultrasonography, looking for      any evidence of carotid artery stenosis.  We will initiate the patient      on aspirin therapy.  We will further risk-stratify him by checking a      fasting lipid panel in the morning.  We will also check a homocysteine.      We will make sure his modifiable risk factors are well controlled,      including hypertension.  2. Chronic obstructive pulmonary disease:  We will continue the patient's      usual dose of Advair and albuterol as well as Atrovent.  3. Hypertension:  The patient's hypertension is currently controlled on      his home regimen, which we will continue here and adjust as needed.  4. Prophylaxis:  We will initiate gastrointestinal and deep venous      thrombosis prophylaxis with Protonix and Lovenox.      Hillery Aldo, M.D.  Electronically Signed     CR/MEDQ  D:  05/06/2006  T:  05/06/2006  Job:  [id number]   cc:   Franklyn Lor, MD

## 2011-02-08 ENCOUNTER — Other Ambulatory Visit: Payer: Self-pay

## 2011-02-08 NOTE — Telephone Encounter (Signed)
This pt is followed by Dr Graciela Husbands.  Diltiazem Rx faxed to [facility] for 2 additional refills and note written for pt to contact our office for appointment with Dr Graciela Husbands prior to refilling this medication in the future.

## 2011-02-15 ENCOUNTER — Encounter: Payer: Self-pay | Admitting: Physician Assistant

## 2011-04-20 ENCOUNTER — Other Ambulatory Visit: Payer: Self-pay | Admitting: *Deleted

## 2011-04-20 MED ORDER — POTASSIUM CHLORIDE 10 MEQ PO CPCR: 10.0000 meq | ORAL_CAPSULE | Freq: Two times a day (BID) | ORAL | Status: DC

## 2011-05-04 ENCOUNTER — Other Ambulatory Visit: Payer: Self-pay | Admitting: *Deleted

## 2011-05-04 MED ORDER — DILTIAZEM HCL ER COATED BEADS 120 MG PO CP24: 120.0000 mg | ORAL_CAPSULE | Freq: Every day | ORAL | Status: DC

## 2011-05-05 ENCOUNTER — Other Ambulatory Visit: Payer: Self-pay | Admitting: *Deleted

## 2011-05-05 DIAGNOSIS — I4891 Unspecified atrial fibrillation: Secondary | ICD-10-CM

## 2011-05-05 MED ORDER — DILTIAZEM HCL ER COATED BEADS 120 MG PO CP24: 120.0000 mg | ORAL_CAPSULE | Freq: Every day | ORAL | Status: DC

## 2011-05-06 ENCOUNTER — Encounter (INDEPENDENT_AMBULATORY_CARE_PROVIDER_SITE_OTHER): Payer: Self-pay | Admitting: General Surgery

## 2011-05-06 ENCOUNTER — Ambulatory Visit (INDEPENDENT_AMBULATORY_CARE_PROVIDER_SITE_OTHER): Payer: Medicare Other | Admitting: General Surgery

## 2011-05-06 VITALS — BP 140/74 | HR 68 | Temp 98.9°F | Ht 71.5 in | Wt 195.8 lb

## 2011-05-06 DIAGNOSIS — L723 Sebaceous cyst: Secondary | ICD-10-CM

## 2011-05-06 NOTE — Patient Instructions (Signed)
We will call to schedule your surgery. Please call us back if he has not heard anything in one week.

## 2011-05-06 NOTE — Progress Notes (Signed)
Subjective:   Cysts on scalp  Patient ID: [name], male   DOB: 1934/12/21, 76 y.o.   MRN: [id number]  HPI Patient is a 76 year old male who presents with enlarging cyst on his scalp. One has been present for at least 10 years and gradually enlarging. It is somewhat uncomfortable but not painful. A second smaller areas developed more recently.  Past Medical History  Diagnosis Date  . A-fib Coumadin Therapy    . COPD (chronic obstructive pulmonary disease)   . Hyperlipidemia   . GERD (gastroesophageal reflux disease)   . Allergic rhinitis   . COPD exacerbation 01/31/11    hospitalised   . Asthma   . Anxiety   . Hiatal hernia   . CAD (coronary artery disease)   . SOB (shortness of breath)   . History of pneumonia   . Sebaceous cyst     scalp  . Neck pain   . S/P ablation of atrial fibrillation   . Emphysema    Past Surgical History  Procedure Date  . Hernia repair     RIH  . Cardiac surgery     a fib surgery   Current Outpatient Prescriptions  Medication Sig Dispense Refill  . albuterol (PROVENTIL) (2.5 MG/3ML) 0.083% nebulizer solution Take 2.5 mg by nebulization 2 (two) times daily.        Marland Kitchen albuterol-ipratropium (COMBIVENT) 18-103 MCG/ACT inhaler Inhale 2 puffs into the lungs 2 (two) times daily.        Marland Kitchen diltiazem (CARDIZEM CD) 120 MG 24 hr capsule Take 1 capsule (120 mg total) by mouth daily.  30 capsule  2  . esomeprazole (NEXIUM) 40 MG capsule Take 40 mg by mouth daily before breakfast.        . furosemide (LASIX) 40 MG tablet Take 40 mg by mouth 2 (two) times daily.        . methylPREDNISolone (MEDROL) 4 MG tablet Take 4 mg by mouth daily.        . montelukast (SINGULAIR) 10 MG tablet Take 10 mg by mouth at bedtime.        . potassium chloride (MICRO-K) 10 MEQ CR capsule Take 1 capsule (10 mEq total) by mouth 2 (two) times daily.  60 capsule  6  . tiotropium (SPIRIVA) 18 MCG inhalation capsule Place 18 mcg into inhaler and inhale daily.        Marland Kitchen warfarin  (COUMADIN) 4 MG tablet Take 4 mg by mouth as directed. Take one tablet (4mg )  by mouth on Wednesday and Thursday. Take half tablet (2mg ) by mouth all other days .        No Known Allergies History  Substance Use Topics  . Smoking status: Former Games developer  . Smokeless tobacco: Not on file  . Alcohol Use: No     Review of Systems  Respiratory: Positive for cough, shortness of breath and wheezing.   Cardiovascular: Negative for chest pain, palpitations and leg swelling.       Objective:   Physical Exam General: Elderly white male in no distress HEENT: Over the left posterior scalp is a large, approximately 4 cm cystic mass consistent with a large sebaceous cyst. There is a similar but much smaller approximately 1 cm area more anterior to this. No other scalp or neck masses. Lymph nodes: No cervical or supraclavicular nodes palpable Lungs: Clear. No wheezing or increased work of breathing Cardiovascular: Regular rhythm. No murmurs. No edema.    Assessment:     Enlarging symptomatic  sebaceous cysts of the scalp. Patient is chronically anticoagulated with history of atrial fibrillation status post ablation. He also has significant COPD.    Plan:     I believe that these could be removed safely under local anesthesia with sedation. He will need to be off his Coumadin for 5 days and we will clear this with Dr. Graciela Husbands. Risks of bleeding, infection, and wound healing were discussed. He would like to proceed and we will schedule this as an outpatient.

## 2011-05-19 ENCOUNTER — Telehealth: Payer: Self-pay | Admitting: *Deleted

## 2011-05-19 NOTE — Telephone Encounter (Signed)
I have left a message for the patient to call. He needs to be set up for an appointment for surgical clearance for Sebaceous Cyst Excision to be done under general anesthesia with Dr. Johna Sheriff. He needs the ok to come off warfarin for 5 days prior, but he has a-fib and a history of TIA. The patient's home # is not working, so the mobile # listed in his chart is his daughter-in -laws. She will relay the message for the patient to call. I have also left a message for Christy at Dr. Jamse Mead office at [phone] in this regard. The patient can see the PA for clearance.

## 2011-05-26 NOTE — Telephone Encounter (Signed)
I have attempted to call the patient at both his home and cell#'s. His home # will not accept messages and the cell # just rings. I will try him back.

## 2011-05-31 ENCOUNTER — Encounter: Payer: Self-pay | Admitting: *Deleted

## 2011-05-31 NOTE — Telephone Encounter (Signed)
I have attempted again to call the patient back at his home #- not accepting calls, & his cell# just rings. I will mail a letter to him asking him to call to set up an appointment for clearance.

## 2011-06-06 LAB — CK TOTAL AND CKMB (NOT AT ARMC)
CK, MB: 2
Relative Index: INVALID
Relative Index: INVALID
Relative Index: INVALID
Total CK: 74

## 2011-06-06 LAB — CBC
MCHC: 33.4
MCHC: 33.6
MCV: 87.9
MCV: 88
MCV: 88.1
Platelets: 167
Platelets: 189
Platelets: 223
RDW: 13.5
WBC: 10.9 — ABNORMAL HIGH
WBC: 5.9

## 2011-06-06 LAB — BASIC METABOLIC PANEL
BUN: 17
BUN: 21
CO2: 31
Calcium: 9.1
Calcium: 9.6
Chloride: 103
Chloride: 105
Creatinine, Ser: 0.79
Creatinine, Ser: 0.91
GFR calc Af Amer: >60
GFR calc non Af Amer: >60
Glucose, Bld: 106 — ABNORMAL HIGH

## 2011-06-06 LAB — LIPID PANEL
Cholesterol: 132
LDL Cholesterol: 83

## 2011-06-06 LAB — I-STAT 8, (EC8 V) (CONVERTED LAB)
BUN: 14
Chloride: 105
Glucose, Bld: 99
Potassium: 4.6
pH, Ven: 7.369 — ABNORMAL HIGH

## 2011-06-06 LAB — D-DIMER, QUANTITATIVE: D-Dimer, Quant: <0.22

## 2011-06-06 LAB — POCT CARDIAC MARKERS
CKMB, poc: <1 — ABNORMAL LOW
Myoglobin, poc: 115
Operator id: 146091

## 2011-06-06 LAB — DIFFERENTIAL
Basophils Relative: 0
Eosinophils Absolute: 0.9 — ABNORMAL HIGH
Neutro Abs: 6.6
Neutrophils Relative %: 60

## 2011-06-06 LAB — TROPONIN I: Troponin I: <0.01

## 2011-06-07 ENCOUNTER — Telehealth: Payer: Self-pay | Admitting: Internal Medicine

## 2011-06-07 LAB — CBC
HCT: 35.9 — ABNORMAL LOW
HCT: 36.7 — ABNORMAL LOW
HCT: 40.2
HCT: 40.7
HCT: 42.4
Hemoglobin: 13.9
Hemoglobin: 12 — ABNORMAL LOW
Hemoglobin: 12.6 — ABNORMAL LOW
Hemoglobin: 13.8
Hemoglobin: 13.9
Hemoglobin: 14.1
Hemoglobin: 14.2
Hemoglobin: 14.3
MCHC: 33.8
MCHC: 33.5
MCHC: 34.6
MCV: 87.8
MCV: 88
Platelets: 232
Platelets: 247
Platelets: 264
RBC: 4.67
RBC: 4.15 — ABNORMAL LOW
RBC: 4.56
RBC: 4.57
RBC: 4.62
RBC: 4.8
RDW: 13.6
RDW: 13.6
RDW: 13.9
RDW: 14
RDW: 14.4
WBC: 11.4 — ABNORMAL HIGH
WBC: 12.7 — ABNORMAL HIGH
WBC: 13.5 — ABNORMAL HIGH
WBC: 14 — ABNORMAL HIGH
WBC: 14.6 — ABNORMAL HIGH
WBC: 15.5 — ABNORMAL HIGH
WBC: 15.5 — ABNORMAL HIGH

## 2011-06-07 LAB — BASIC METABOLIC PANEL
BUN: 20
CO2: 20
CO2: 31
Calcium: 8.4
Calcium: 8.4
Calcium: 8.5
Calcium: 9.2
Chloride: 96
Creatinine, Ser: 0.81
GFR calc Af Amer: >60
GFR calc Af Amer: >60
GFR calc Af Amer: >60
GFR calc Af Amer: >60
GFR calc non Af Amer: >60
GFR calc non Af Amer: >60
GFR calc non Af Amer: >60
GFR calc non Af Amer: >60
Glucose, Bld: 143 — ABNORMAL HIGH
Potassium: 3.6
Potassium: 3.7
Potassium: 4
Potassium: 4.3
Potassium: 4.7
Sodium: 135
Sodium: 135
Sodium: 135
Sodium: 139
Sodium: 140

## 2011-06-07 LAB — POCT I-STAT 3, ART BLOOD GAS (G3+)
O2 Saturation: 94
TCO2: 26
pCO2 arterial: 41.8
pH, Arterial: 7.382
pO2, Arterial: 72 — ABNORMAL LOW

## 2011-06-07 LAB — CULTURE, RESPIRATORY W GRAM STAIN

## 2011-06-07 LAB — TROPONIN I: Troponin I: 0.02

## 2011-06-07 LAB — DIFFERENTIAL
Lymphs Abs: 0.5 — ABNORMAL LOW
Monocytes Absolute: 0.3
Monocytes Relative: 1 — ABNORMAL LOW
Neutro Abs: 19 — ABNORMAL HIGH
Neutrophils Relative %: 96 — ABNORMAL HIGH

## 2011-06-07 LAB — PROTIME-INR
INR: 1.3
INR: 1.3
INR: 2.3 — ABNORMAL HIGH
INR: 2.4 — ABNORMAL HIGH
INR: 3.2 — ABNORMAL HIGH
INR: 3.2 — ABNORMAL HIGH
INR: 3.3 — ABNORMAL HIGH
INR: 3.5 — ABNORMAL HIGH
Prothrombin Time: 16.5 — ABNORMAL HIGH
Prothrombin Time: 16.4 — ABNORMAL HIGH
Prothrombin Time: 26 — ABNORMAL HIGH
Prothrombin Time: 32.5 — ABNORMAL HIGH
Prothrombin Time: 33.7 — ABNORMAL HIGH
Prothrombin Time: 33.9 — ABNORMAL HIGH

## 2011-06-07 LAB — CARDIAC PANEL(CRET KIN+CKTOT+MB+TROPI)
CK, MB: 3.4
CK, MB: 3.5
Relative Index: INVALID
Relative Index: INVALID
Total CK: 76
Total CK: 82
Troponin I: 0.02
Troponin I: 0.02

## 2011-06-07 LAB — APTT: aPTT: 31

## 2011-06-07 LAB — CK TOTAL AND CKMB (NOT AT ARMC)
CK, MB: 2.3
Relative Index: INVALID
Total CK: 81

## 2011-06-07 LAB — COMPREHENSIVE METABOLIC PANEL
Albumin: 3.3 — ABNORMAL LOW
BUN: 20
Calcium: 8.8
Glucose, Bld: 159 — ABNORMAL HIGH
Potassium: 4.6
Sodium: 134 — ABNORMAL LOW
Total Protein: 6.7

## 2011-06-07 LAB — INFLUENZA A+B VIRUS AG-DIRECT(RAPID): Influenza B Ag: NEGATIVE

## 2011-06-07 LAB — CULTURE, BLOOD (ROUTINE X 2): Culture: NO GROWTH

## 2011-06-07 LAB — URINE CULTURE: Colony Count: NO GROWTH

## 2011-06-07 LAB — EXPECTORATED SPUTUM ASSESSMENT W GRAM STAIN, RFLX TO RESP C

## 2011-06-07 LAB — B-NATRIURETIC PEPTIDE (CONVERTED LAB): Pro B Natriuretic peptide (BNP): 316 — ABNORMAL HIGH

## 2011-06-09 LAB — APTT: aPTT: 30

## 2011-06-09 LAB — PROTIME-INR: INR: 1.5

## 2011-06-13 LAB — PROTIME-INR
INR: 2.4 — ABNORMAL HIGH
Prothrombin Time: 16.1 — ABNORMAL HIGH
Prothrombin Time: 27.3 — ABNORMAL HIGH

## 2011-06-13 LAB — CBC
Hemoglobin: 13.5
MCHC: 33.5
MCV: 87.7
Platelets: 201
RBC: 4.34
RBC: 4.42
RBC: 4.61
RDW: 13.2
WBC: 11.4 — ABNORMAL HIGH
WBC: 6.9

## 2011-06-13 LAB — DIFFERENTIAL
Basophils Absolute: 0
Basophils Relative: 0
Eosinophils Absolute: 0.6
Eosinophils Absolute: 0.6
Eosinophils Relative: 7 — ABNORMAL HIGH
Lymphs Abs: 1.9
Monocytes Relative: 10
Neutrophils Relative %: 54

## 2011-06-13 LAB — POCT I-STAT 3, ART BLOOD GAS (G3+)
pCO2 arterial: 47.2 — ABNORMAL HIGH
pH, Arterial: 7.393

## 2011-06-13 LAB — POCT CARDIAC MARKERS
CKMB, poc: 1.1
Myoglobin, poc: 81.7
Troponin i, poc: <0.05
Troponin i, poc: <0.05

## 2011-06-13 LAB — COMPREHENSIVE METABOLIC PANEL
ALT: 16
AST: 20
Alkaline Phosphatase: 53
CO2: 30
Chloride: 107
GFR calc Af Amer: >60
GFR calc non Af Amer: >60
Sodium: 142
Total Bilirubin: 0.8

## 2011-06-13 LAB — BASIC METABOLIC PANEL
Calcium: 9.6
Chloride: 106
Creatinine, Ser: 0.95
Creatinine, Ser: 1.15
GFR calc Af Amer: >60
GFR calc Af Amer: >60
Potassium: 3.8

## 2011-06-13 LAB — CARDIAC PANEL(CRET KIN+CKTOT+MB+TROPI)
Relative Index: 1.4
Total CK: 126
Total CK: 190
Troponin I: <0.01
Troponin I: <0.01

## 2011-06-13 LAB — MAGNESIUM: Magnesium: 2

## 2011-06-15 ENCOUNTER — Ambulatory Visit (INDEPENDENT_AMBULATORY_CARE_PROVIDER_SITE_OTHER): Payer: Medicare Other | Admitting: Physician Assistant

## 2011-06-15 ENCOUNTER — Encounter: Payer: Self-pay | Admitting: Physician Assistant

## 2011-06-15 VITALS — BP 140/68 | HR 70 | Ht 71.0 in | Wt 199.1 lb

## 2011-06-15 DIAGNOSIS — I4891 Unspecified atrial fibrillation: Secondary | ICD-10-CM

## 2011-06-15 DIAGNOSIS — Z01810 Encounter for preprocedural cardiovascular examination: Secondary | ICD-10-CM | POA: Insufficient documentation

## 2011-06-15 NOTE — Assessment & Plan Note (Signed)
He is maintaining normal sinus rhythm.  His Coumadin is followed by the Coumadin clinic at [facility]  See discussion below.  Follow up with Dr. Graciela Husbands in 6 months.

## 2011-06-15 NOTE — Assessment & Plan Note (Signed)
He does not have any unstable cardiac condition.  He can achieve 4 METS without chest pain or significant dyspnea.  He remains in normal sinus rhythm.  He does not have any symptoms of congestive heart failure.   According to [facility] and AHA guidelines, the patient requires no further cardiac workup prior to their noncardiac surgery.  The patient should be at acceptable risk.  Our service is available as necessary in the perioperative period.   He is on chronic Coumadin.  He does carry a history of suspected TIA and there was a CT scan done several years ago that demonstrated a lacunar infarct. He has maintained sinus rhythm for some time now.  He is not on an antiarrhythmic.  His Coumadin only needs to be held briefly.  I discussed his case further with Dr. Johney Frame (DOD).  At this point, we feel it is safe for him to come off of Coumadin without Lovenox bridging for his procedure.  We recommend trying to minimize the amount of time he is off of his Coumadin.  Hopefully his Coumadin can be held for 3-4 days prior to his procedure.  It should be restarted postoperatively as soon as it is felt to be safe.

## 2011-06-15 NOTE — Progress Notes (Signed)
History of Present Illness: Primary Electrophysiologist:  Dr. Darnelle Catalan Carmical is a 76 y.o. male who presents for surgical clearance.  He has a history of paroxysmal atrial fibrillation in the setting of previously oxygen dependent lung disease.  He was previously on flecainide.  He was diagnosed with SVT in July 2011 and underwent radiofrequency catheter ablation in September 2011.  He remains on Coumadin.  He has chronic dyspnea from his COPD.  Cardiac catheterization July 2009 demonstrated minimal coronary plaque.  His last Myoview 6/11 demonstrated an EF of 67% and was felt to be a low risk scan.  Last echocardiogram 1/10: EF 65-70% with mild diastolic dysfunction.  He needs several sebaceous cysts excised from his scalp.  The patient denies chest pain, significant shortness of breath, syncope, orthopnea, PND or significant pedal edema.  No palpitations.  He hauls tires every day and denies chest pain.    Past Medical History  Diagnosis Date  . Atrial fibrillation     coumadin therapy;  echo 1/10: EF 65-70%, mild diast dysfxn  . COPD (chronic obstructive pulmonary disease)     admitted 5/11  . Hyperlipidemia   . GERD (gastroesophageal reflux disease)   . Allergic rhinitis   . Asthma   . Anxiety   . Hiatal hernia   . CAD (coronary artery disease)     cath 7/09: pCFX 25%, mRCA 25%, EF 60%;  Myoview 6/11: low risk, EF 67%  . History of pneumonia   . Sebaceous cyst     scalp  . Neck pain   . SVT (supraventricular tachycardia)     s/p RFCA 05/2010  . Emphysema   . First degree AV block   . History of TIAs     Current Outpatient Prescriptions  Medication Sig Dispense Refill  . albuterol (PROVENTIL) (2.5 MG/3ML) 0.083% nebulizer solution Take 2.5 mg by nebulization as needed.       Marland Kitchen albuterol-ipratropium (COMBIVENT) 18-103 MCG/ACT inhaler Inhale 2 puffs into the lungs 2 (two) times daily.        Marland Kitchen diltiazem (CARDIZEM CD) 120 MG 24 hr capsule Take 1 capsule (120 mg  total) by mouth daily.  30 capsule  2  . esomeprazole (NEXIUM) 40 MG capsule Take 40 mg by mouth daily before breakfast.        . furosemide (LASIX) 40 MG tablet Take 40 mg by mouth 2 (two) times daily.        . montelukast (SINGULAIR) 10 MG tablet Take 10 mg by mouth at bedtime.        . potassium chloride (MICRO-K) 10 MEQ CR capsule Take 1 capsule (10 mEq total) by mouth 2 (two) times daily.  60 capsule  6  . tiotropium (SPIRIVA) 18 MCG inhalation capsule Place 18 mcg into inhaler and inhale daily.        Marland Kitchen warfarin (COUMADIN) 4 MG tablet Take 2 mg by mouth as directed. 2 mg on Wednesday and Sunday and 1 mg the rest of the days        Allergies: No Known Allergies  Social history:  Ex-smoker  ROS:  Please see the history of present illness.  All other systems reviewed and negative.   Vital Signs: BP 140/68  Pulse 70  Ht 5\' 11"  (1.803 m)  Wt 199 lb 1.9 oz (90.32 kg)  BMI 27.77 kg/m2  PHYSICAL EXAM: Well nourished, well developed, in no acute distress HEENT: normal Neck: no JVD Vascular: No carotid bruits Cardiac:  normal S1, S2; RRR; no murmur Lungs:  Decreased breath sounds bilaterally, no wheezing, rhonchi or rales Abd: soft, nontender, no hepatomegaly Ext: no edema Skin: warm and dry; Large sebaceous cyst noted at his crown Neuro:  CNs 2-12 intact, no focal abnormalities noted Psych: normal affect   EKG:  Sinus rhythm, heart rate 70, normal axis, no ischemic changes, first degree AV block with a PR interval of 214 ms  ASSESSMENT AND PLAN:

## 2011-06-15 NOTE — Patient Instructions (Signed)
Your physician wants you to follow-up in: 6 months with Dr. Klein. You will receive a reminder letter in the mail two months in advance. If you don't receive a letter, please call our office to schedule the follow-up appointment.  

## 2011-06-17 LAB — BASIC METABOLIC PANEL
BUN: 22 mg/dL (ref 6–23)
CO2: 28 mEq/L (ref 19–32)
CO2: 29 mEq/L (ref 19–32)
Calcium: 9.4 mg/dL (ref 8.4–10.5)
Chloride: 96 mEq/L (ref 96–112)
Chloride: 98 mEq/L (ref 96–112)
Chloride: 99 mEq/L (ref 96–112)
Creatinine, Ser: 0.96 mg/dL (ref 0.4–1.5)
GFR calc Af Amer: >60 mL/min (ref >60)
GFR calc Af Amer: >60 mL/min (ref >60)
GFR calc non Af Amer: >60 mL/min (ref >60)
Potassium: 4.2 mEq/L (ref 3.5–5.1)
Potassium: 4.3 mEq/L (ref 3.5–5.1)
Sodium: 134 mEq/L — ABNORMAL LOW (ref 135–145)
Sodium: 137 mEq/L (ref 135–145)
Sodium: 137 mEq/L (ref 135–145)

## 2011-06-17 LAB — PROTIME-INR
INR: 2.4 — ABNORMAL HIGH (ref 0.00–1.49)
INR: 3.2 — ABNORMAL HIGH (ref 0.00–1.49)
Prothrombin Time: 30.8 seconds — ABNORMAL HIGH (ref 11.6–15.2)
Prothrombin Time: 35.7 seconds — ABNORMAL HIGH (ref 11.6–15.2)

## 2011-06-17 LAB — POCT CARDIAC MARKERS
CKMB, poc: 1.8 ng/mL (ref 1.0–8.0)
Myoglobin, poc: 204 ng/mL (ref 12–200)
Troponin i, poc: <0.05 ng/mL (ref 0.00–0.09)
Troponin i, poc: <0.05 ng/mL (ref 0.00–0.09)

## 2011-06-17 LAB — POCT I-STAT, CHEM 8
BUN: 12 mg/dL (ref 6–23)
BUN: 15 mg/dL (ref 6–23)
Calcium, Ion: 1.15 mmol/L (ref 1.12–1.32)
Calcium, Ion: 1.17 mmol/L (ref 1.12–1.32)
Chloride: 102 mEq/L (ref 96–112)
Creatinine, Ser: 1.3 mg/dL (ref 0.4–1.5)
Glucose, Bld: 92 mg/dL (ref 70–99)
Glucose, Bld: 94 mg/dL (ref 70–99)
HCT: 41 % (ref 39.0–52.0)
Hemoglobin: 13.9 g/dL (ref 13.0–17.0)
TCO2: 28 mmol/L (ref 0–100)
TCO2: 30 mmol/L (ref 0–100)

## 2011-06-17 LAB — CBC
HCT: 36.9 % — ABNORMAL LOW (ref 39.0–52.0)
HCT: 38.2 % — ABNORMAL LOW (ref 39.0–52.0)
HCT: 39.7 % (ref 39.0–52.0)
HCT: 39.7 % (ref 39.0–52.0)
HCT: 40.6 % (ref 39.0–52.0)
Hemoglobin: 12.6 g/dL — ABNORMAL LOW (ref 13.0–17.0)
Hemoglobin: 12.9 g/dL — ABNORMAL LOW (ref 13.0–17.0)
Hemoglobin: 13.3 g/dL (ref 13.0–17.0)
Hemoglobin: 13.4 g/dL (ref 13.0–17.0)
Hemoglobin: 13.5 g/dL (ref 13.0–17.0)
MCHC: 33.5 g/dL (ref 30.0–36.0)
MCHC: 33.7 g/dL (ref 30.0–36.0)
MCHC: 34.1 g/dL (ref 30.0–36.0)
MCV: 88.3 fL (ref 78.0–100.0)
MCV: 89 fL (ref 78.0–100.0)
MCV: 89.4 fL (ref 78.0–100.0)
MCV: 89.5 fL (ref 78.0–100.0)
Platelets: 235 10*3/uL (ref 150–400)
Platelets: 238 10*3/uL (ref 150–400)
RBC: 4.18 MIL/uL — ABNORMAL LOW (ref 4.22–5.81)
RBC: 4.27 MIL/uL (ref 4.22–5.81)
RBC: 4.44 MIL/uL (ref 4.22–5.81)
RDW: 13.3 % (ref 11.5–15.5)
RDW: 13.4 % (ref 11.5–15.5)
RDW: 13.4 % (ref 11.5–15.5)
WBC: 11.5 10*3/uL — ABNORMAL HIGH (ref 4.0–10.5)
WBC: 12.3 10*3/uL — ABNORMAL HIGH (ref 4.0–10.5)
WBC: 15.4 10*3/uL — ABNORMAL HIGH (ref 4.0–10.5)
WBC: 9.5 10*3/uL (ref 4.0–10.5)

## 2011-06-17 LAB — POCT I-STAT 3, ART BLOOD GAS (G3+)
Acid-Base Excess: 4 mmol/L — ABNORMAL HIGH (ref 0.0–2.0)
Bicarbonate: 29.3 mEq/L — ABNORMAL HIGH (ref 20.0–24.0)
O2 Saturation: 95 %
Patient temperature: 98.6
TCO2: 31 mmol/L (ref 0–100)
pCO2 arterial: 44.6 mmHg (ref 35.0–45.0)
pH, Arterial: 7.426 (ref 7.350–7.450)
pO2, Arterial: 75 mmHg — ABNORMAL LOW (ref 80.0–100.0)

## 2011-06-17 LAB — B-NATRIURETIC PEPTIDE (CONVERTED LAB)
Pro B Natriuretic peptide (BNP): 145 pg/mL — ABNORMAL HIGH (ref 0.0–100.0)
Pro B Natriuretic peptide (BNP): 57 pg/mL (ref 0.0–100.0)

## 2011-06-17 LAB — DIFFERENTIAL
Eosinophils Relative: 4 % (ref 0–5)
Lymphocytes Relative: 10 % — ABNORMAL LOW (ref 12–46)
Lymphs Abs: 1.6 10*3/uL (ref 0.7–4.0)
Monocytes Absolute: 1.8 10*3/uL — ABNORMAL HIGH (ref 0.1–1.0)
Monocytes Relative: 12 % (ref 3–12)

## 2011-06-22 ENCOUNTER — Emergency Department (HOSPITAL_COMMUNITY): Payer: Medicare Other

## 2011-06-22 ENCOUNTER — Emergency Department (HOSPITAL_COMMUNITY)
Admission: EM | Admit: 2011-06-22 | Discharge: 2011-06-22 | Disposition: A | Discharge status: Home / Self Care | Payer: Medicare Other | Attending: Emergency Medicine | Admitting: Emergency Medicine

## 2011-06-22 DIAGNOSIS — R0682 Tachypnea, not elsewhere classified: Secondary | ICD-10-CM | POA: Insufficient documentation

## 2011-06-22 DIAGNOSIS — J40 Bronchitis, not specified as acute or chronic: Secondary | ICD-10-CM | POA: Insufficient documentation

## 2011-06-22 DIAGNOSIS — R079 Chest pain, unspecified: Secondary | ICD-10-CM | POA: Insufficient documentation

## 2011-06-22 DIAGNOSIS — Z8673 Personal history of transient ischemic attack (TIA), and cerebral infarction without residual deficits: Secondary | ICD-10-CM | POA: Insufficient documentation

## 2011-06-22 DIAGNOSIS — J4489 Other specified chronic obstructive pulmonary disease: Secondary | ICD-10-CM | POA: Insufficient documentation

## 2011-06-22 DIAGNOSIS — R0602 Shortness of breath: Secondary | ICD-10-CM | POA: Insufficient documentation

## 2011-06-22 DIAGNOSIS — J449 Chronic obstructive pulmonary disease, unspecified: Secondary | ICD-10-CM | POA: Insufficient documentation

## 2011-06-22 DIAGNOSIS — R609 Edema, unspecified: Secondary | ICD-10-CM | POA: Insufficient documentation

## 2011-06-22 LAB — BASIC METABOLIC PANEL
BUN: 9 mg/dL (ref 6–23)
CO2: 34 mEq/L — ABNORMAL HIGH (ref 19–32)
Chloride: 98 mEq/L (ref 96–112)
GFR calc Af Amer: >90 mL/min (ref >90)
Potassium: 3.3 mEq/L — ABNORMAL LOW (ref 3.5–5.1)

## 2011-06-22 LAB — CBC
HCT: 41.4 % (ref 39.0–52.0)
MCV: 86.8 fL (ref 78.0–100.0)
RBC: 4.77 MIL/uL (ref 4.22–5.81)
RDW: 12.8 % (ref 11.5–15.5)
WBC: 7.1 10*3/uL (ref 4.0–10.5)

## 2011-06-22 LAB — PRO B NATRIURETIC PEPTIDE: Pro B Natriuretic peptide (BNP): 46.4 pg/mL (ref 0–125)

## 2011-06-29 ENCOUNTER — Ambulatory Visit (HOSPITAL_BASED_OUTPATIENT_CLINIC_OR_DEPARTMENT_OTHER)
Admission: RE | Admit: 2011-06-29 | Discharge: 2011-06-29 | Disposition: A | Discharge status: Home / Self Care | Payer: Medicare Other | Source: Ambulatory Visit | Attending: General Surgery | Admitting: General Surgery

## 2011-06-29 ENCOUNTER — Other Ambulatory Visit (INDEPENDENT_AMBULATORY_CARE_PROVIDER_SITE_OTHER): Payer: Self-pay | Admitting: General Surgery

## 2011-06-29 DIAGNOSIS — J449 Chronic obstructive pulmonary disease, unspecified: Secondary | ICD-10-CM | POA: Insufficient documentation

## 2011-06-29 DIAGNOSIS — I10 Essential (primary) hypertension: Secondary | ICD-10-CM | POA: Insufficient documentation

## 2011-06-29 DIAGNOSIS — Z9981 Dependence on supplemental oxygen: Secondary | ICD-10-CM | POA: Insufficient documentation

## 2011-06-29 DIAGNOSIS — J4489 Other specified chronic obstructive pulmonary disease: Secondary | ICD-10-CM | POA: Insufficient documentation

## 2011-06-29 DIAGNOSIS — L723 Sebaceous cyst: Secondary | ICD-10-CM

## 2011-06-29 DIAGNOSIS — I509 Heart failure, unspecified: Secondary | ICD-10-CM | POA: Insufficient documentation

## 2011-06-29 LAB — POCT I-STAT, CHEM 8
BUN: 19 mg/dL (ref 6–23)
Chloride: 99 mEq/L (ref 96–112)
Glucose, Bld: 99 mg/dL (ref 70–99)
HCT: 47 % (ref 39.0–52.0)
Potassium: 3.9 mEq/L (ref 3.5–5.1)

## 2011-06-30 ENCOUNTER — Telehealth (INDEPENDENT_AMBULATORY_CARE_PROVIDER_SITE_OTHER): Payer: Self-pay

## 2011-06-30 ENCOUNTER — Other Ambulatory Visit (INDEPENDENT_AMBULATORY_CARE_PROVIDER_SITE_OTHER): Payer: Self-pay | Admitting: General Surgery

## 2011-06-30 NOTE — Telephone Encounter (Signed)
Pts daughter called re:pt not getting pain relief with vicodin. Pt is using ice packs and has taken 2 vicodin at a time and is still having enough pain he cannot rest. Pt lives in [place] and daughter can come to office to pick up written rx if needed. Pharmacy is [facility] [phone]. Will review with MD and call back with recommedations.

## 2011-06-30 NOTE — Telephone Encounter (Signed)
I got a rx from Dr Andrey Campanile for Percocet 5-325 1-2 po q4prn #40 no refills and I notified the daughter that she could pick it up at the office.  I asked her to look for signs of infection and to call us back if has any.

## 2011-07-04 NOTE — Op Note (Signed)
  NAMEVANESSA, [name] NO.:  [id number]  MEDICAL RECORD NO.:  [id number]  LOCATION:                                 FACILITY:  PHYSICIAN:  Lorne Skeens. Azuree Minish, M.D.DATE OF BIRTH:  [birth year]  DATE OF PROCEDURE:  06/29/2011 DATE OF DISCHARGE:                              OPERATIVE REPORT   PREOPERATIVE DIAGNOSIS: 1. Multiple sebaceous cysts.     a.     Scalp 4 cm.     b.     Scalp 2 cm.     c.     Mid back 5 cm.  POSTOPERATIVE DIAGNOSIS: 1. Multiple sebaceous cysts.     a.     Scalp 4 cm.     b.     Scalp 2 cm.     c.     Mid back 5 cm.  SURGICAL PROCEDURE:  Excision of sebaceous cyst x3.  SURGEON:  Lorne Skeens. Jayleen Afonso, MD  ANESTHESIA:  Local IV sedation.  BRIEF HISTORY:  [name] is a 76 year old male who presents with enlarging and uncomfortable typical-appearing sebaceous cysts.  There is a large one on his posterior left scalp measuring about 4 cm and a smaller on the anterior left scalp 2 cm and a larger 5 cm on his mid back.  After discussion of options, we elected to proceed with excision under local anesthesia with sedation.  Risks of bleeding, infection, wound healing problems, anesthetic complications were discussed, and understood.  He is now brought to operating room for these procedures.  DESCRIPTION OF OPERATION:  The patient was brought to the operating room, positioned in right lateral decubitus position.  IV sedation administered.  Initially the scalp was widely and sterilely prepped and draped with Betadine, correct patient and procedure were verified.  I did a transversely oriented elliptical incision around the large cyst on his posterior left scalp.  Dissection was carried down into the subcu with cautery.  The dissection was deepened down to the galea and the entire large cyst was lifted out intact.  The galea was left intact. Hemostasis was obtained with cautery.  The wound was then closed under some mild tension with  interrupted 3-0 nylon sutures.  Attention was then turned to the anterior left cyst.  It was also infiltrated extensively with local anesthesia and a longitudinally-oriented elliptical excision performed and closure in an identical fashion.  The back was then re-prepped and draped.  The soft tissue was infiltrated with local anesthetic and a longitudinal elliptical skin incision encompassing the cyst was performed down to the deep subcutaneous tissue, and the wound was closed with running 3-0 nylon.  Sponge and needle counts were correct.  Dressings were applied.  The patient taken to recovery room in good condition.     Lorne Skeens. Ousman Dise, M.D.     Tory Emerald  D:  06/29/2011  T:  06/29/2011  Job:  [id number]  Electronically Signed by Glenna Fellows M.D. on 07/04/2011 02:46:10 PM

## 2011-07-11 ENCOUNTER — Emergency Department (HOSPITAL_COMMUNITY): Payer: Medicare Other

## 2011-07-11 ENCOUNTER — Inpatient Hospital Stay (HOSPITAL_COMMUNITY)
Admission: EM | Admit: 2011-07-11 | Discharge: 2011-07-14 | DRG: 192 | Disposition: A | Discharge status: Home / Self Care | Payer: Medicare Other | Attending: Internal Medicine | Admitting: Internal Medicine

## 2011-07-11 DIAGNOSIS — J441 Chronic obstructive pulmonary disease with (acute) exacerbation: Principal | ICD-10-CM | POA: Diagnosis present

## 2011-07-11 DIAGNOSIS — R7309 Other abnormal glucose: Secondary | ICD-10-CM | POA: Diagnosis present

## 2011-07-11 DIAGNOSIS — I4891 Unspecified atrial fibrillation: Secondary | ICD-10-CM | POA: Diagnosis present

## 2011-07-11 DIAGNOSIS — E785 Hyperlipidemia, unspecified: Secondary | ICD-10-CM | POA: Diagnosis present

## 2011-07-11 DIAGNOSIS — T380X5A Adverse effect of glucocorticoids and synthetic analogues, initial encounter: Secondary | ICD-10-CM | POA: Diagnosis present

## 2011-07-11 LAB — CBC
HCT: 38.6 % — ABNORMAL LOW (ref 39.0–52.0)
MCH: 29.8 pg (ref 26.0–34.0)
MCV: 86.4 fL (ref 78.0–100.0)
Platelets: 193 10*3/uL (ref 150–400)
RDW: 12.7 % (ref 11.5–15.5)

## 2011-07-11 LAB — CK TOTAL AND CKMB (NOT AT ARMC): Total CK: 61 U/L (ref 7–232)

## 2011-07-11 LAB — TROPONIN I: Troponin I: <0.3 ng/mL (ref <0.30)

## 2011-07-11 LAB — DIFFERENTIAL
Eosinophils Absolute: 0.1 10*3/uL (ref 0.0–0.7)
Eosinophils Relative: 1 % (ref 0–5)
Lymphs Abs: 1.4 10*3/uL (ref 0.7–4.0)
Monocytes Absolute: 1.4 10*3/uL — ABNORMAL HIGH (ref 0.1–1.0)
Monocytes Relative: 11 % (ref 3–12)

## 2011-07-11 LAB — BASIC METABOLIC PANEL
Calcium: 8.8 mg/dL (ref 8.4–10.5)
Chloride: 97 mEq/L (ref 96–112)
Creatinine, Ser: 0.9 mg/dL (ref 0.50–1.35)
GFR calc Af Amer: >90 mL/min (ref >90)
GFR calc non Af Amer: 82 mL/min — ABNORMAL LOW (ref >90)

## 2011-07-12 DIAGNOSIS — R072 Precordial pain: Secondary | ICD-10-CM

## 2011-07-12 LAB — CBC
HCT: 37.8 % — ABNORMAL LOW (ref 39.0–52.0)
MCH: 29 pg (ref 26.0–34.0)
MCV: 87.1 fL (ref 78.0–100.0)
Platelets: 196 10*3/uL (ref 150–400)
RBC: 4.34 MIL/uL (ref 4.22–5.81)
RDW: 12.7 % (ref 11.5–15.5)

## 2011-07-12 LAB — CARDIAC PANEL(CRET KIN+CKTOT+MB+TROPI)
CK, MB: 1.4 ng/mL (ref 0.3–4.0)
CK, MB: 1.7 ng/mL (ref 0.3–4.0)
Relative Index: INVALID (ref 0.0–2.5)
Total CK: 65 U/L (ref 7–232)
Troponin I: <0.3 ng/mL (ref <0.30)
Troponin I: <0.3 ng/mL (ref <0.30)
Troponin I: <0.3 ng/mL (ref <0.30)

## 2011-07-12 LAB — LIPID PANEL
Cholesterol: 146 mg/dL (ref 0–200)
HDL: 36 mg/dL — ABNORMAL LOW (ref >39)
LDL Cholesterol: 96 mg/dL (ref 0–99)
Triglycerides: 71 mg/dL (ref <150)

## 2011-07-12 LAB — PROTIME-INR
INR: 2.09 — ABNORMAL HIGH (ref 0.00–1.49)
Prothrombin Time: 23.8 seconds — ABNORMAL HIGH (ref 11.6–15.2)

## 2011-07-12 LAB — BASIC METABOLIC PANEL
BUN: 13 mg/dL (ref 6–23)
CO2: 27 mEq/L (ref 19–32)
GFR calc non Af Amer: 83 mL/min — ABNORMAL LOW (ref >90)
Glucose, Bld: 214 mg/dL — ABNORMAL HIGH (ref 70–99)
Potassium: 4.2 mEq/L (ref 3.5–5.1)
Sodium: 132 mEq/L — ABNORMAL LOW (ref 135–145)

## 2011-07-12 LAB — GLUCOSE, CAPILLARY
Glucose-Capillary: 188 mg/dL — ABNORMAL HIGH (ref 70–99)
Glucose-Capillary: 170 mg/dL — ABNORMAL HIGH (ref 70–99)

## 2011-07-12 NOTE — H&P (Signed)
NAMEMarland Kitchen  STONY, STEGMANN NO.:  [id number]  MEDICAL RECORD NO.:  [id number]  LOCATION:  WLED                         FACILITY:  [facility]  PHYSICIAN:  Debbora Presto, MD DATE OF BIRTH:  1935/01/24  DATE OF ADMISSION:  07/11/2011 DATE OF DISCHARGE:                             HISTORY & PHYSICAL   PRIMARY CARE PHYSICIAN:  [name], MD  CARDIOLOGY:  Duke Salvia, MD, [facility], with [place].  CHIEF COMPLAINT:  Chest pain for 4 days.  HISTORY OF PRESENT ILLNESS:  A 76 year old male with history of COPD, atrial fibrillation status post ablation, coronary artery disease, valvular heart disease status post valvuloplasty, asthma, hiatal hernia, presents with complaints of retrosternal chest pain, started 4 days ago, 10/10 in intensity, radiated to left lower rib cage area with no aggravating or alleviating factors, but as per patient, associated with palpitations and shortness of breath.  The patient reports chest pain as intermittent, lasting anywhere from 3 to 5 minutes, half an hour, and today it lasted for about couple of hours.  The patient also reports subjective fever at home on the day of admission.  He developed cough, productive of yellowish sputum which started few days ago.  The patient went to see primary care physician today and he was told to go emergency room because his heart rate was in 130.  The patient complains of no nausea or vomiting or abdominal pain.  No complaints of blood in the urine or stool.  No lightheadedness or dizziness or loss of consciousness.  The patient has no complaints of night sweats or sweating throughout the day.  No complaints of unintentional weight gain or weight loss.  The patient admitted to hospitalist service for further evaluation and management.  PAST MEDICAL HISTORY: 1. COPD. 2. Asthma. 3. Atrial fibrillation, status post ablation and on anticoagulation     with Coumadin. 4. Coronary artery disease. 5.  Valvular heart disease, status post valvuloplasty. 6. Diverticulosis. 7. History of TIA. 8. Hiatal hernia.  PAST SURGICAL HISTORY: 1. Ablation for atrial fibrillation. 2. Valvuloplasty with valvular heart disease. 3. Repair of right inguinal hernia, January 2007. 4. Excision of sebaceous cyst x3 for multiple sebaceous cysts.  DRUG ALLERGIES:  No known drug allergies.  FAMILY HISTORY:  Not significant.  SOCIAL HISTORY:  Patient was a former smoker, 4 packs per day for 30 years, quit about 30 years ago.  No illegal drug abuse or alcohol abuse.  MEDICATIONS AT HOME: 1. Coumadin 4 mg, Wednesday, Saturday and Sunday and 2 mg on all other     days. 2. Nexium 40 mg daily. 3. Alprazolam 0.5 mg 2 times a day as needed for anxiety. 4. Simvastatin 40 mg at bedtime. 5. Diltiazem extended release 120 mg daily. 6. Singulair 10 mg daily at bedtime. 7. Combivent 1 puff inhaled twice daily. 8. Albuterol inhaler 2 puffs every 4 hours as needed for shortness of     breath. 9. Spiriva 1 capsule at bedtime. 10.Lasix 20 mg daily. 11.Potassium chloride 20 mEq daily.  REVIEW OF SYSTEMS:  As per HPI.  PHYSICAL EXAMINATION:  VITAL SIGNS:  Blood pressure 140/91, pulse 114, temperature 97.4, respirations 20, and oxygen saturation 92% on  4 L nasal cannula. GENERAL APPEARANCE:  The patient is in no acute distress, appears comfortable. HEENT:  Head normocephalic, atraumatic.  Extraocular muscles intact. Pupils equally round, reactive to light and accommodation.  No tonsillar erythema or exudate.  Poor dentition. NECK:  Supple.  No lymphadenopathy.  No JVD.  No carotid bruits. LUNGS:  No wheezing or rhonchi or rales appreciated.  Bilateral air entry. CARDIOVASCULAR:  Positive S1 and S2.  Irregular rhythm.  __________ rate controlled. ABDOMEN:  Positive bowel sounds, soft, nontender/nondistended. EXTREMITIES:  Pulses palpable bilaterally.  No lower extremity edema. SKIN:  Warm, dry. NEUROLOGIC:   Alert, awake, oriented x3.  No focal neurologic deficits.  DIAGNOSTIC STUDIES:  Chest x-ray which shows COPD and no active cardiopulmonary disease.  LABORATORY DATA:  White blood cells 12, hemoglobin 13.3, hematocrit 38.6, and platelets 193.  Cardiac enzymes x1 negative.  Sodium 133, potassium 3.6, chloride 97, bicarbonate 26, BUN 13, creatinine 0.90, glucose 93.  ASSESSMENT AND PLAN: 1. Chest pain.  We will rule out myocardial infarction, cycle cardiac     enzymes for a total of 3 sets as well as 12-lead EKG.  We will     obtain TSH, hemoglobin A1c, and fasting lipid panel for risk     stratification.  __________ sublingual nitroglycerin as needed for     chest pain provided blood pressure tolerates.  Oxygen also provided     via nasal cannula to keep oxygen saturation above 90%.  We will     also provide analgesia with Tylenol and Percocet for now. 2. Acute exacerbation of chronic obstructive pulmonary disease.  The     patient will be put on albuterol and Atrovent nebulizer every 6     hours q.h.s. 3. Possible community-acquired pneumonia.  We will start the patient     on azithromycin 500 mg daily IV and ceftriaxone 1 g daily IV.  We     will start prednisone 60 mg daily and taper by 10 mg a day while     patient is in hospital.  Oxygen via nasal cannula, keep oxygen     saturation above 90%. 4. Atrial fibrillation.  At present, the patient is rate controlled.     Continue Coumadin, dosing as per pharmacy protocol, as well as     diltiazem extended release 120 mg daily for rate control.  Further     management will depend on how patient does clinically with this     regimen.  For right now, there is no need for Cardizem drip. 5. Hypertension.  Blood pressure is 140/91.  We will continue     diltiazem extended release 120 mg daily. 6. Dyslipidemia.  Please continue simvastatin 40 mg at bedtime. 7. History of diastolic congestive heart failure.  The patient had a 2-     D echo in  January 2010 which showed mild diastolic dysfunction and     ejection fraction of 65% to 70%.  Please obtain BNP and patient may     continue his home medication Lasix 20 mg daily as well as potassium     chloride 20 mEq daily.  Please keep potassium above 4 and magnesium     above 2. 8. Diet.  Heart healthy. 9. Deep vein thrombosis prophylaxis.  The patient is already on     anticoagulation. 10.Advance directives.  Full code. 11.Education:  The patient is aware of plan of care and treatment.  Time spent admitting the patient more than 35 minutes.  Debbora Presto, MD     IM/MEDQ  D:  07/11/2011  T:  07/11/2011  Job:  [id number]  Electronically Signed by Debbora Presto MD on 07/12/2011 01:59:43 PM

## 2011-07-13 ENCOUNTER — Other Ambulatory Visit: Payer: Self-pay | Admitting: *Deleted

## 2011-07-13 DIAGNOSIS — I4891 Unspecified atrial fibrillation: Secondary | ICD-10-CM

## 2011-07-13 LAB — BASIC METABOLIC PANEL
Calcium: 9.4 mg/dL (ref 8.4–10.5)
GFR calc Af Amer: >90 mL/min (ref >90)
GFR calc non Af Amer: 86 mL/min — ABNORMAL LOW (ref >90)
Glucose, Bld: 140 mg/dL — ABNORMAL HIGH (ref 70–99)
Potassium: 3.8 mEq/L (ref 3.5–5.1)
Sodium: 138 mEq/L (ref 135–145)

## 2011-07-13 LAB — CBC
Platelets: 228 10*3/uL (ref 150–400)
RBC: 4.26 MIL/uL (ref 4.22–5.81)
RDW: 12.8 % (ref 11.5–15.5)
WBC: 10.5 10*3/uL (ref 4.0–10.5)

## 2011-07-13 LAB — PROTIME-INR
INR: 2.5 — ABNORMAL HIGH (ref 0.00–1.49)
Prothrombin Time: 27.4 seconds — ABNORMAL HIGH (ref 11.6–15.2)

## 2011-07-13 MED ORDER — DILTIAZEM HCL ER COATED BEADS 120 MG PO CP24: 120.0000 mg | ORAL_CAPSULE | Freq: Every day | ORAL | Status: DC

## 2011-07-14 ENCOUNTER — Encounter (INDEPENDENT_AMBULATORY_CARE_PROVIDER_SITE_OTHER): Payer: Medicare Other

## 2011-07-14 LAB — PROTIME-INR: Prothrombin Time: 28.5 seconds — ABNORMAL HIGH (ref 11.6–15.2)

## 2011-07-14 LAB — GLUCOSE, CAPILLARY: Glucose-Capillary: 112 mg/dL — ABNORMAL HIGH (ref 70–99)

## 2011-07-15 NOTE — Discharge Summary (Signed)
[name], [name] NO.:  [id number]  MEDICAL RECORD NO.:  [id number]  LOCATION:  1445                         FACILITY:  [facility]  PHYSICIAN:  Cody Blower, MD       DATE OF BIRTH:  August 18, 1935  DATE OF ADMISSION:  07/11/2011 DATE OF DISCHARGE:  07/14/2011                              DISCHARGE SUMMARY   PRIMARY CARE PHYSICIAN:  [name], M.D.  CARDIOLOGIST:  Cody Salvia, MD, [facility]  DISCHARGE DIAGNOSES: 1. Acute COPD exacerbation. 2. History of atrial fibrillation, status post ablation, on chronic     anticoagulation. 3. Asthma. 4. History of coronary artery disease. 5. Hyperglycemia secondary to steroids. 6. Hyperlipidemia. 7. History of valvular heart disease, status post valvuloplasty. 8. History of diverticulosis. 9.History of transient ischemic attack. 10.History of hiatal hernia. 11.History of syncope secondary to intractable cough. 12.History of back surgery. 13.History of O2 dependent chronic obstructive pulmonary disease, on 2     L/minute. 14.Excision of sebaceous cyst x3.  DISCHARGE MEDICATIONS: 1. Atorvastatin 20 mg p.o. q. day. 2. Levofloxacin 750 mg p.o. q. day for 4 days. 3. Prednisone 20 mg p.o. q. day for 2 days, then 10 mg p.o. q. day for     2 days, then 5 mg p.o. q. day for 2 days, and discontinue. 4. Tiotropium 18 mcg inhaled daily. 5. Albuterol inhaler 2 puffs every 4 hours as needed for shortness of     breath, wheezing. 6. Albuterol nebulizer 2.5 mg inhaler every 6 hours as needed for     wheezing or shortness of breath. 7. Diltiazem 120 mg p.o. q.a.m. 8. Furosemide 20 mg p.o. q.a.m. 9. Singulair 10 mg p.o. daily at bedtime. 10.Nexium 40 mg p.o. q.p.m. 11.Potassium chloride 20 mEq p.o. q.p.m. 12.Warfarin 4 mg on Sunday, Wednesday, and Friday, and 2 mg on the     other days. 13.Xanax 0.5 mg p.o. 3 times a day.  BRIEF ADMITTING HISTORY AND PHYSICAL:  [name] is a 76 year old male with history of COPD, AFib,  status post ablation, coronary artery disease, who presented on July 11, 2011, complaining about chest pain.  RADIOLOGY/IMAGING:  The patient had a chest x-ray, 2 view, which showed COPD with no acute cardiopulmonary process.  LABORATORY DATA:  CBC shows white count of 10.5, hemoglobin 12.4, hematocrit 37.0.  INR 2.63.  Electrolytes normal with a BUN of 13, creatinine 0.80.  Troponins negative x4.  LDL is 96.  TSH is 0.397.  HOSPITAL COURSE BY PROBLEMS: 1. Shortness of breath, likely due to acute COPD exacerbation.  The     patient was started initially on IV steroids, which were     transitioned to oral.  Patient was started also on IV antibiotics,     which was then transitioned to Levaquin.  Patient during the     hospital stay, after being started on steroids and Levaquin,     improved.  Patient will be on levofloxacin for 4 more days to     complete a 7 day course of antibiotics.  Patient will continue     steroid taper for 6 more days. 2. Atrial fibrillation.  Patient was rate controlled.  INR was  therapeutic. 3. Hyperglycemia secondary to steroids, stable. 4. Hyperlipidemia.  Patient's simvastatin was changed to atorvastatin     given high degree of interaction between simvastatin and diltiazem. 5. Status post excision of recent sebaceous cyst x3, stable.  DISPOSITION/FOLLOWUP:  The patient was instructed to follow up with Dr. Rudi [name], his primary care physician in 1 week.  Also, discussed with the patient about discussing with [name] whether the patient would benefit from having a standing order of steroids and antibiotics, which he would be able to use in the event that he had another such episode.  Time spent on discharge talking to the patient and coordinating care was 25 minutes.     Cody Blower, MD     SR/MEDQ  D:  07/14/2011  T:  07/14/2011  Job:  [id number]  Electronically Signed by [name]  on 07/15/2011 08:08:38 PM

## 2011-07-22 ENCOUNTER — Encounter (INDEPENDENT_AMBULATORY_CARE_PROVIDER_SITE_OTHER): Payer: Self-pay | Admitting: General Surgery

## 2011-09-08 ENCOUNTER — Other Ambulatory Visit: Payer: Self-pay

## 2011-09-08 ENCOUNTER — Emergency Department (HOSPITAL_BASED_OUTPATIENT_CLINIC_OR_DEPARTMENT_OTHER)
Admission: EM | Admit: 2011-09-08 | Discharge: 2011-09-08 | Disposition: A | Discharge status: Home / Self Care | Payer: Medicare Other | Attending: Emergency Medicine | Admitting: Emergency Medicine

## 2011-09-08 ENCOUNTER — Encounter (HOSPITAL_BASED_OUTPATIENT_CLINIC_OR_DEPARTMENT_OTHER): Payer: Self-pay | Admitting: *Deleted

## 2011-09-08 ENCOUNTER — Emergency Department (INDEPENDENT_AMBULATORY_CARE_PROVIDER_SITE_OTHER): Payer: Medicare Other

## 2011-09-08 DIAGNOSIS — R0989 Other specified symptoms and signs involving the circulatory and respiratory systems: Secondary | ICD-10-CM

## 2011-09-08 DIAGNOSIS — Z8679 Personal history of other diseases of the circulatory system: Secondary | ICD-10-CM | POA: Insufficient documentation

## 2011-09-08 DIAGNOSIS — R05 Cough: Secondary | ICD-10-CM

## 2011-09-08 DIAGNOSIS — R0602 Shortness of breath: Secondary | ICD-10-CM | POA: Insufficient documentation

## 2011-09-08 DIAGNOSIS — K219 Gastro-esophageal reflux disease without esophagitis: Secondary | ICD-10-CM | POA: Insufficient documentation

## 2011-09-08 DIAGNOSIS — J449 Chronic obstructive pulmonary disease, unspecified: Secondary | ICD-10-CM

## 2011-09-08 DIAGNOSIS — Z79899 Other long term (current) drug therapy: Secondary | ICD-10-CM | POA: Insufficient documentation

## 2011-09-08 DIAGNOSIS — J3489 Other specified disorders of nose and nasal sinuses: Secondary | ICD-10-CM | POA: Insufficient documentation

## 2011-09-08 DIAGNOSIS — J441 Chronic obstructive pulmonary disease with (acute) exacerbation: Secondary | ICD-10-CM | POA: Insufficient documentation

## 2011-09-08 DIAGNOSIS — I2510 Atherosclerotic heart disease of native coronary artery without angina pectoris: Secondary | ICD-10-CM | POA: Insufficient documentation

## 2011-09-08 LAB — CBC
HCT: 43 % (ref 39.0–52.0)
Hemoglobin: 14.8 g/dL (ref 13.0–17.0)
MCV: 85.5 fL (ref 78.0–100.0)
WBC: 8.7 10*3/uL (ref 4.0–10.5)

## 2011-09-08 LAB — DIFFERENTIAL
Basophils Absolute: 0 10*3/uL (ref 0.0–0.1)
Eosinophils Relative: 9 % — ABNORMAL HIGH (ref 0–5)
Lymphocytes Relative: 30 % (ref 12–46)
Lymphs Abs: 2.6 10*3/uL (ref 0.7–4.0)
Monocytes Absolute: 0.8 10*3/uL (ref 0.1–1.0)
Monocytes Relative: 9 % (ref 3–12)
Neutro Abs: 4.5 10*3/uL (ref 1.7–7.7)

## 2011-09-08 LAB — BASIC METABOLIC PANEL
BUN: 11 mg/dL (ref 6–23)
CO2: 31 mEq/L (ref 19–32)
Calcium: 9.7 mg/dL (ref 8.4–10.5)
Chloride: 99 mEq/L (ref 96–112)
Creatinine, Ser: 0.9 mg/dL (ref 0.50–1.35)
Glucose, Bld: 118 mg/dL — ABNORMAL HIGH (ref 70–99)

## 2011-09-08 MED ORDER — PREDNISONE 10 MG PO TABS: 20.0000 mg | ORAL_TABLET | Freq: Two times a day (BID) | ORAL | Status: DC

## 2011-09-08 MED ORDER — ALBUTEROL SULFATE (5 MG/ML) 0.5% IN NEBU
INHALATION_SOLUTION | RESPIRATORY_TRACT | Status: AC
  Administered 2011-09-08: 5 mg via RESPIRATORY_TRACT
  Filled 2011-09-08: qty 1

## 2011-09-08 MED ORDER — IPRATROPIUM BROMIDE 0.02 % IN SOLN
RESPIRATORY_TRACT | Status: AC
  Administered 2011-09-08: 0.5 mg via RESPIRATORY_TRACT
  Filled 2011-09-08: qty 2.5

## 2011-09-08 MED ORDER — METHYLPREDNISOLONE SODIUM SUCC 125 MG IJ SOLR
125.0000 mg | Freq: Once | INTRAMUSCULAR | Status: AC
  Administered 2011-09-08: 125 mg via INTRAVENOUS
  Filled 2011-09-08: qty 2

## 2011-09-08 NOTE — ED Provider Notes (Signed)
History     CSN: [id number]  Arrival date & time 09/08/11  7829   First MD Initiated Contact with Patient 09/08/11 [phone]      Chief Complaint  Patient presents with  . Cough  . Shortness of Breath  . Nasal Congestion    (Consider location/radiation/quality/duration/timing/severity/associated sxs/prior treatment) HPI Comments: Was seen by pcp yesterday for same, had cxr and was started on Zmax.  Came here with daughter this morning for her to be seen, became more short of breath while in waiting room.  His sats were low in waiting room, but was not on his oxygen.    Patient is a 76 y.o. male presenting with cough and shortness of breath. The history is provided by the patient.  Cough This is a recurrent problem. The current episode started more than 2 days ago. The problem occurs constantly. The problem has been gradually worsening. The cough is productive of sputum. There has been no fever. Associated symptoms include shortness of breath. Pertinent negatives include no chest pain. He is not a smoker.  Shortness of Breath  Associated symptoms include cough and shortness of breath. Pertinent negatives include no chest pain.    Past Medical History  Diagnosis Date  . Atrial fibrillation     coumadin therapy;  echo 1/10: EF 65-70%, mild diast dysfxn  . COPD (chronic obstructive pulmonary disease)     admitted 5/11  . Hyperlipidemia   . GERD (gastroesophageal reflux disease)   . Allergic rhinitis   . Asthma   . Anxiety   . Hiatal hernia   . CAD (coronary artery disease)     cath 7/09: pCFX 25%, mRCA 25%, EF 60%;  Myoview 6/11: low risk, EF 67%  . History of pneumonia   . Sebaceous cyst     scalp  . Neck pain   . SVT (supraventricular tachycardia)     s/p RFCA 05/2010  . Emphysema   . First degree AV block   . History of TIAs     Past Surgical History  Procedure Date  . Hernia repair     RIH  . Cardiac surgery     a fib surgery    Family History  Problem Relation  Age of Onset  . Hypertension Mother   . COPD Father   . COPD Sister   . Cancer Brother     lung    History  Substance Use Topics  . Smoking status: Former Games developer  . Smokeless tobacco: Not on file  . Alcohol Use: No      Review of Systems  Respiratory: Positive for cough and shortness of breath.   Cardiovascular: Negative for chest pain.  All other systems reviewed and are negative.    Allergies  Review of patient's allergies indicates no known allergies.  Home Medications   Current Outpatient Rx  Name Route Sig Dispense Refill  . ALBUTEROL SULFATE (2.5 MG/3ML) 0.083% IN NEBU Nebulization Take 2.5 mg by nebulization as needed.     Maximino Greenland 18-103 MCG/ACT IN AERO Inhalation Inhale 2 puffs into the lungs 2 (two) times daily.      Marland Kitchen DILTIAZEM HCL ER COATED BEADS 120 MG PO CP24 Oral Take 1 capsule (120 mg total) by mouth daily. 30 capsule 6  . ESOMEPRAZOLE MAGNESIUM 40 MG PO CPDR Oral Take 40 mg by mouth daily before breakfast.      . MONTELUKAST SODIUM 10 MG PO TABS Oral Take 10 mg by mouth at bedtime.      Marland Kitchen  POTASSIUM CHLORIDE 10 MEQ PO CPCR Oral Take 1 capsule (10 mEq total) by mouth 2 (two) times daily. 60 capsule 6  . TIOTROPIUM BROMIDE MONOHYDRATE 18 MCG IN CAPS Inhalation Place 18 mcg into inhaler and inhale daily.        BP 168/90  Pulse 92  Temp(Src) 99.8 F (37.7 C) (Oral)  Resp 24  SpO2 98%  Physical Exam  Nursing note and vitals reviewed. Constitutional: He is oriented to person, place, and time.  HENT:  Head: Normocephalic and atraumatic.  Mouth/Throat: Oropharynx is clear and moist.  Neck: Normal range of motion. Neck supple.  Cardiovascular: Normal rate and regular rhythm.   No murmur heard. Pulmonary/Chest:       Appears in mild respiratory distress.  Is able to speak in sentences while on oxygen.  There are scattered rhonchi bilaterally.  Abdominal: Soft. Bowel sounds are normal. He exhibits no distension. There is no tenderness.    Musculoskeletal: Normal range of motion. He exhibits no edema.  Lymphadenopathy:    He has no cervical adenopathy.  Neurological: He is alert and oriented to person, place, and time.  Skin: Skin is warm and dry. He is not diaphoretic.    ED Course  Procedures (including critical care time)  Labs Reviewed - No data to display No results found.   No diagnosis found.   Date: 09/08/2011  Rate: 91   Rhythm: normal sinus rhythm  QRS Axis: normal  Intervals: normal  ST/T Wave abnormalities: normal  Conduction Disutrbances:none  Narrative Interpretation:   Old EKG Reviewed: unchanged    MDM  EKG, CXR, labs look okay.  Patient feeling better and saturating 100% on the 3Lnc he normally uses at home.  Will discharge with prednisone, continue antibiotics, and cough med he already has.        Geoffery Lyons, MD 09/08/11 [phone]

## 2011-09-08 NOTE — ED Notes (Signed)
This rn called by registration staff, pt observed at registration sitting straight up in chair, with pursed lip breathing. Pt taken to room 5 in w/c, reports 2 weeks of cough, congestion and shortness of breath. Pt denies any chest pain, fevers or other c/o. Pt states he saw his pcp yesterday "and he xrayed me but didn't do nothing!" rt alerted to bedside for resp assessment, aerosol treatments initiated while pt being triaged, initial room air sat 81% with good pleth, pt placed on o2 at 2lpm, sats increase to 90%. Pt states "I wear oxygen at home most of the time."

## 2011-09-27 ENCOUNTER — Ambulatory Visit (INDEPENDENT_AMBULATORY_CARE_PROVIDER_SITE_OTHER): Payer: Medicare Other | Admitting: Internal Medicine

## 2011-09-27 ENCOUNTER — Encounter: Payer: Self-pay | Admitting: Internal Medicine

## 2011-09-27 DIAGNOSIS — R05 Cough: Secondary | ICD-10-CM | POA: Insufficient documentation

## 2011-09-27 DIAGNOSIS — J449 Chronic obstructive pulmonary disease, unspecified: Secondary | ICD-10-CM

## 2011-09-27 DIAGNOSIS — R059 Cough, unspecified: Secondary | ICD-10-CM | POA: Insufficient documentation

## 2011-09-27 MED ORDER — PREDNISONE (PAK) 10 MG PO TABS: ORAL_TABLET | ORAL | Status: DC

## 2011-09-27 MED ORDER — MOMETASONE FURO-FORMOTEROL FUM 200-5 MCG/ACT IN AERO: INHALATION_SPRAY | RESPIRATORY_TRACT | Status: DC

## 2011-09-27 MED ORDER — FAMOTIDINE 20 MG PO TABS: ORAL_TABLET | ORAL | Status: DC

## 2011-09-27 MED ORDER — TRAMADOL HCL 50 MG PO TABS: ORAL_TABLET | ORAL | Status: DC

## 2011-09-27 NOTE — Progress Notes (Signed)
  Subjective:    Patient ID: [name], male    DOB: [birth year]   MRN: [id number]  HPI  79 yowm quit smoking around 1980 with breathing problems variably worse since then referred by Dr Zelphia Cairo to pulmonary clinic in [place].   09/27/2011 1st pulmonary eval on 02 but  breathing much worse x 2 weeks to point where can only go about 50 ft sob s 02,  Not limited on 02 but not wearing it consistently,  some better after using neb albuterol  more so than combivent. Also trouble sleeping now need neb every 4 hours, better on prednisone tranisently, no purulent sputum.    Also denies any obvious fluctuation of symptoms with weather or environmental changes or other aggravating or alleviating factors except as outlined above     Review of Systems  Constitutional: Positive for appetite change. Negative for fever and unexpected weight change.  HENT: Positive for ear pain and congestion. Negative for nosebleeds, sore throat, rhinorrhea, sneezing, trouble swallowing, dental problem, postnasal drip and sinus pressure.   Eyes: Negative for redness and itching.  Respiratory: Positive for cough and shortness of breath. Negative for chest tightness and wheezing.   Cardiovascular: Positive for chest pain and leg swelling. Negative for palpitations.  Gastrointestinal: Negative for nausea and vomiting.  Genitourinary: Negative for dysuria.  Musculoskeletal: Negative for joint swelling.  Skin: Negative for rash.  Neurological: Positive for headaches.  Hematological: Does not bruise/bleed easily.  Psychiatric/Behavioral: Positive for dysphoric mood. The patient is not nervous/anxious.        Objective:   Physical Exam  amb wm nad  Wt 195 09/27/2011 HEENT mild turbinate edema.  Oropharynx no thrush or excess pnd or cobblestoning.  No JVD or cervical adenopathy. Mild accessory muscle hypertrophy. Trachea midline, nl thryroid. Chest was hyperinflated by percussion with diminished breath sounds and  moderate increased exp time without wheeze. Hoover sign positive at mid inspiration. Regular rate and rhythm without murmur gallop or rub or increase P2 or edema.  Abd: no hsm, nl excursion. Ext warm without cyanosis or clubbing.    09/08/11 cxr Findings: Trachea is midline. Heart size normal. Lungs appear  mildly hyperexpanded but clear. No pleural fluid.  IMPRESSION:  COPD. No acute findings.     Assessment & Plan:

## 2011-09-27 NOTE — Assessment & Plan Note (Signed)
Moderate severity but clearly improves at least transiently on prednisone so must have an asthmatic component.  Therefore rec a trial of dulera 200 Take 2 puffs first thing in am and then another 2 puffs about 12 hours later.    The proper method of use, as well as anticipated side effects, of this metered-dose inhaler are discussed and demonstrated to the patient. Improved to 75% with coaching.

## 2011-09-27 NOTE — Assessment & Plan Note (Addendum)
The most common causes of chronic cough in immunocompetent adults include the following: upper airway cough syndrome (UACS), previously referred to as postnasal drip syndrome (PNDS), which is caused by variety of rhinosinus conditions; (2) asthma; (3) GERD; (4) chronic bronchitis from cigarette smoking or other inhaled environmental irritants; (5) nonasthmatic eosinophilic bronchitis; and (6) bronchiectasis.   These conditions, singly or in combination, have accounted for up to 94% of the causes of chronic cough in prospective studies.   Other conditions have constituted no >6% of the causes in prospective studies These have included bronchogenic carcinoma, chronic interstitial pneumonia, sarcoidosis, left ventricular failure, ACEI-induced cough, and aspiration from a condition associated with pharyngeal dysfunction.   .Chronic cough is often simultaneously caused by more than one condition. A single cause has been found from 38 to 82% of the time, multiple causes from 18 to 62%. Multiply caused cough has been the result of three diseases up to 42% of the time.    Since has known GERD most likely this is combination of AB and GERD ? Exacerbating his copd also > rec max gerd rx/ diet and eliminate cyclical coughing pending f/u with pft's

## 2011-09-27 NOTE — Patient Instructions (Addendum)
Nexium 40 mg Take 30-60 min before first meal of the day and Pepcid 20 mg one at bedtime  GERD (REFLUX)  is an extremely common cause of respiratory symptoms, many times with no significant heartburn at all.    It can be treated with medication, but also with lifestyle changes including avoidance of late meals, excessive alcohol, smoking cessation, and avoid fatty foods, chocolate, peppermint, colas, red wine, and acidic juices such as orange juice.  NO MINT OR MENTHOL PRODUCTS SO NO COUGH DROPS  USE SUGARLESS CANDY INSTEAD (jolley ranchers or Stover's)  NO OIL BASED VITAMINS - use powdered substitutes.  Start dulera 200 Take 2 puffs first thing in am and then another 2 puffs about 12 hours later chase the am with spiriva  Prednisone 10 mg take  4 each am x 2 days,   2 each am x 2 days,  1 each am x2days and stop    Work on inhaler technique:  relax and gently blow all the way out then take a nice smooth deep breath back in, triggering the inhaler at same time you start breathing in.  Hold for up to 5 seconds if you can.  Rinse and gargle with water when done   If your mouth or throat starts to bother you,   I suggest you time the inhaler to your dental care and after using the inhaler(s) brush teeth and tongue with a baking soda containing toothpaste and when you rinse this out, gargle with it first to see if this helps your mouth and throat.     Only use your albuterol as a rescue medication to be used if you can't catch your breath by resting or doing a relaxed purse lip breathing pattern. The less you use it, the better it will work when you need it.  Take mucinex dm 2  every 12 hours and supplement if needed with  tramadol 50 mg up to 2 every 4 hours to suppress the urge to cough. Swallowing water or using ice chips/non mint and menthol containing candies (such as lifesavers or sugarless jolly ranchers) are also effective.  You should rest your voice and avoid activities that you know make  you cough.  Once you have eliminated the cough for 3 straight days try reducing the tramadol first,  then the mucinex dm as tolerated.      Please schedule a follow up office visit in 4  weeks, sooner if needed with PFTs in [place] office

## 2011-09-28 ENCOUNTER — Telehealth: Payer: Self-pay | Admitting: Internal Medicine

## 2011-09-28 MED ORDER — TRAMADOL HCL 50 MG PO TABS: ORAL_TABLET | ORAL | Status: AC

## 2011-09-28 MED ORDER — PREDNISONE (PAK) 10 MG PO TABS: ORAL_TABLET | ORAL | Status: AC

## 2011-09-28 NOTE — Telephone Encounter (Signed)
Pharmacy never received prescriptions for Tramadol or Prednisone and per pt's daughter, pt did not get the prescriptions at his OV. Both medications have been called to [facility].

## 2011-10-25 ENCOUNTER — Telehealth: Payer: Self-pay | Admitting: Internal Medicine

## 2011-10-25 MED ORDER — TRAMADOL HCL 50 MG PO TABS: ORAL_TABLET | ORAL | Status: DC

## 2011-10-25 NOTE — Telephone Encounter (Signed)
I spoke with pt and she states pt needs a refill on his tramadol. This was last filled 09/27/2011. Please advise if okay to refill Dr. Sherene Sires, thanks

## 2011-10-25 NOTE — Telephone Encounter (Signed)
Ok x one but needs ov with all meds in hand before more

## 2011-10-25 NOTE — Telephone Encounter (Signed)
Called and spoke with pt's family member and informed her rx sent to pharmacy and encouraged her to have pt keep pending OV with [facility] for 11/24/11.

## 2011-10-26 ENCOUNTER — Ambulatory Visit: Payer: Medicare Other | Admitting: Internal Medicine

## 2011-11-15 NOTE — Telephone Encounter (Signed)
error 

## 2011-11-24 ENCOUNTER — Encounter: Payer: Self-pay | Admitting: Internal Medicine

## 2011-11-24 ENCOUNTER — Ambulatory Visit (INDEPENDENT_AMBULATORY_CARE_PROVIDER_SITE_OTHER): Payer: Medicare Other | Admitting: Internal Medicine

## 2011-11-24 VITALS — BP 158/72 | HR 73 | Temp 97.7°F | Ht 72.0 in | Wt 203.0 lb

## 2011-11-24 DIAGNOSIS — J449 Chronic obstructive pulmonary disease, unspecified: Secondary | ICD-10-CM

## 2011-11-24 DIAGNOSIS — R05 Cough: Secondary | ICD-10-CM

## 2011-11-24 LAB — PULMONARY FUNCTION TEST

## 2011-11-24 NOTE — Progress Notes (Signed)
PFT done today. 

## 2011-11-24 NOTE — Progress Notes (Signed)
  Subjective:    Patient ID: [name], male    DOB: August 01, 1935   MRN: [id number]  Brief patient profile:  62 yowm quit smoking around 1980 with breathing problems variably worse since then referred by Dr Zelphia Cairo to pulmonary clinic in [place].   09/27/2011 1st pulmonary eval on 02 but  breathing much worse x 2 weeks to point where can only go about 50 ft sob s 02,  Not limited on 02 but not wearing it consistently,  some better after using neb albuterol  more so than combivent. Also trouble sleeping now need neb every 4 hours, better on prednisone tranisently, no purulent sputum.  rec Nexium 40 mg Take 30-60 min before first meal of the day and Pepcid 20 mg one at bedtime GERD  diet Start dulera 200 Take 2 puffs first thing in am and then another 2 puffs about 12 hours later chase the am with spiriva Prednisone 10 mg take  4 each am x 2 days,   2 each am x 2 days,  1 each am x2days and stop  Work on inhaler technique:t.    Only use your albuterol as a rescue medication to be used if you can't catch your breath by resting or doing a relaxed purse lip breathing pattern. The less you use it, the better it will work when you need it. Take mucinex dm 2  every 12 hours and supplement if needed with  tramadol 50 mg up to 2 every 4 hours  Please schedule a follow up office visit in 4  weeks, sooner if needed with PFTs in [place] office   11/24/2011 f/u ov/[name] cc Pt states breathing is 100% improved- started working again and is doing great. No complaints cough, no limitations in terms of adls, no overt sinus or hb symptosm   Sleeping ok without nocturnal  or early am exacerbation  of respiratory  c/o's or need for noct saba. Also denies any obvious fluctuation of symptoms with weather or environmental changes or other aggravating or alleviating factors except as outlined above   ROS  At present neg for  any significant sore throat, dysphagia, itching, sneezing,  nasal congestion or excess/  purulent secretions,  fever, chills, sweats, unintended wt loss, pleuritic or exertional cp, hempoptysis, orthopnea pnd or leg swelling.  Also denies presyncope, palpitations, heartburn, abdominal pain, nausea, vomiting, diarrhea  or change in bowel or urinary habits, dysuria,hematuria,  rash, arthralgias, visual complaints, headache, numbness weakness or ataxia.           Objective:   Physical Exam  amb wm nad  Wt 195 09/27/2011 > 11/24/2011 203 HEENT mild turbinate edema.  Oropharynx no thrush or excess pnd or cobblestoning.  No JVD or cervical adenopathy. Mild accessory muscle hypertrophy. Trachea midline, nl thryroid. Chest was hyperinflated by percussion with diminished breath sounds and moderate increased exp time without wheeze. Hoover sign positive at mid inspiration. Regular rate and rhythm without murmur gallop or rub or increase P2 or edema.  Abd: no hsm, nl excursion. Ext warm without cyanosis or clubbing.    09/08/11 cxr Findings: Trachea is midline. Heart size normal. Lungs appear  mildly hyperexpanded but clear. No pleural fluid.  IMPRESSION:  COPD. No acute findings.     Assessment & Plan:

## 2011-11-24 NOTE — Patient Instructions (Signed)
Work on Chemical engineer technique:  relax and gently blow all the way out then take a nice smooth deep breath back in, triggering the inhaler at same time you start breathing in.  Hold for up to 5 seconds if you can.  Rinse and gargle with water when done   If your mouth or throat starts to bother you,   I suggest you time the inhaler to your dental care and after using the inhaler(s) brush teeth and tongue with a baking soda containing toothpaste and when you rinse this out, gargle with it first to see if this helps your mouth and throat.     We can see you the first Tuesday of the month in [place] if needed

## 2011-11-25 ENCOUNTER — Encounter: Payer: Self-pay | Admitting: Internal Medicine

## 2011-11-25 NOTE — Assessment & Plan Note (Signed)
-   PFT's 11/24/2011  FEV1  1.97 (66%) ratio 55 and no better with B2, dlco 94%    - 09/27/2011 HFA 75% p teaching > 90% 11/24/2011   GOLD II copd with no symptoms on present rx  The proper method of use, as well as anticipated side effects, of a metered-dose inhaler are discussed and demonstrated to the patient. Improved effectiveness after extensive coaching during this visit to a level of approximately  75%    Each maintenance medication was reviewed in detail including most importantly the difference between maintenance and as needed and under what circumstances the prns are to be used.  Please see instructions for details which were reviewed in writing and the patient given a copy.    See instructions for specific recommendations which were reviewed directly with the patient who was given a copy with highlighter outlining the key components.

## 2011-12-06 ENCOUNTER — Other Ambulatory Visit: Payer: Self-pay | Admitting: *Deleted

## 2011-12-06 MED ORDER — POTASSIUM CHLORIDE ER 10 MEQ PO TBCR: 10.0000 meq | EXTENDED_RELEASE_TABLET | Freq: Two times a day (BID) | ORAL | Status: DC

## 2011-12-08 ENCOUNTER — Encounter: Payer: Self-pay | Admitting: Internal Medicine

## 2012-01-24 ENCOUNTER — Other Ambulatory Visit: Payer: Self-pay | Admitting: Internal Medicine

## 2012-01-24 MED ORDER — DILTIAZEM HCL ER COATED BEADS 120 MG PO CP24: 120.0000 mg | ORAL_CAPSULE | Freq: Every day | ORAL | Status: DC

## 2012-02-23 ENCOUNTER — Emergency Department (HOSPITAL_COMMUNITY)
Admission: EM | Admit: 2012-02-23 | Discharge: 2012-02-23 | Disposition: A | Discharge status: Home / Self Care | Payer: Medicare Other | Attending: Emergency Medicine | Admitting: Emergency Medicine

## 2012-02-23 ENCOUNTER — Emergency Department (HOSPITAL_COMMUNITY): Payer: Medicare Other

## 2012-02-23 ENCOUNTER — Encounter (HOSPITAL_COMMUNITY): Payer: Self-pay | Admitting: Emergency Medicine

## 2012-02-23 ENCOUNTER — Other Ambulatory Visit: Payer: Self-pay

## 2012-02-23 DIAGNOSIS — I2510 Atherosclerotic heart disease of native coronary artery without angina pectoris: Secondary | ICD-10-CM | POA: Insufficient documentation

## 2012-02-23 DIAGNOSIS — R079 Chest pain, unspecified: Secondary | ICD-10-CM | POA: Insufficient documentation

## 2012-02-23 DIAGNOSIS — I4891 Unspecified atrial fibrillation: Secondary | ICD-10-CM | POA: Insufficient documentation

## 2012-02-23 DIAGNOSIS — E785 Hyperlipidemia, unspecified: Secondary | ICD-10-CM | POA: Insufficient documentation

## 2012-02-23 DIAGNOSIS — J4489 Other specified chronic obstructive pulmonary disease: Secondary | ICD-10-CM | POA: Insufficient documentation

## 2012-02-23 DIAGNOSIS — R091 Pleurisy: Secondary | ICD-10-CM | POA: Insufficient documentation

## 2012-02-23 DIAGNOSIS — Z8673 Personal history of transient ischemic attack (TIA), and cerebral infarction without residual deficits: Secondary | ICD-10-CM | POA: Insufficient documentation

## 2012-02-23 DIAGNOSIS — Z79899 Other long term (current) drug therapy: Secondary | ICD-10-CM | POA: Insufficient documentation

## 2012-02-23 DIAGNOSIS — J449 Chronic obstructive pulmonary disease, unspecified: Secondary | ICD-10-CM | POA: Insufficient documentation

## 2012-02-23 LAB — COMPREHENSIVE METABOLIC PANEL
AST: 17 U/L (ref 0–37)
Albumin: 3.5 g/dL (ref 3.5–5.2)
Alkaline Phosphatase: 71 U/L (ref 39–117)
BUN: 13 mg/dL (ref 6–23)
CO2: 27 mEq/L (ref 19–32)
Chloride: 105 mEq/L (ref 96–112)
Potassium: 3.9 mEq/L (ref 3.5–5.1)
Total Bilirubin: 0.3 mg/dL (ref 0.3–1.2)

## 2012-02-23 LAB — CBC
Hemoglobin: 12.9 g/dL — ABNORMAL LOW (ref 13.0–17.0)
MCHC: 33.4 g/dL (ref 30.0–36.0)
WBC: 8.4 10*3/uL (ref 4.0–10.5)

## 2012-02-23 LAB — POCT I-STAT TROPONIN I: Troponin i, poc: 0.01 ng/mL (ref 0.00–0.08)

## 2012-02-23 LAB — PROTIME-INR
INR: 2.51 — ABNORMAL HIGH (ref 0.00–1.49)
Prothrombin Time: 27.5 seconds — ABNORMAL HIGH (ref 11.6–15.2)

## 2012-02-23 LAB — DIFFERENTIAL
Basophils Absolute: 0 10*3/uL (ref 0.0–0.1)
Basophils Relative: 1 % (ref 0–1)
Lymphocytes Relative: 26 % (ref 12–46)
Monocytes Relative: 7 % (ref 3–12)
Neutro Abs: 5.4 10*3/uL (ref 1.7–7.7)
Neutrophils Relative %: 65 % (ref 43–77)

## 2012-02-23 LAB — URINALYSIS, ROUTINE W REFLEX MICROSCOPIC
Glucose, UA: NEGATIVE mg/dL
Hgb urine dipstick: NEGATIVE
Ketones, ur: NEGATIVE mg/dL
Protein, ur: NEGATIVE mg/dL

## 2012-02-23 LAB — URINE MICROSCOPIC-ADD ON

## 2012-02-23 MED ORDER — AZITHROMYCIN 250 MG PO TABS: ORAL_TABLET | ORAL | Status: DC

## 2012-02-23 MED ORDER — SODIUM CHLORIDE 0.9 % IV SOLN
INTRAVENOUS | Status: DC
  Administered 2012-02-23: 14:00:00 via INTRAVENOUS

## 2012-02-23 NOTE — Discharge Instructions (Signed)
[name], you had physical exam, laboratory tests, EKG and chest x-ray to check on [name]. for cough, fever and chest pain that has been going on for a week and fortunately, your tests were removed there was no pneumonia and no heart attack. He can take the antibiotic azithromycin, 2 tablets today then one tablet once a day for the next 4 days to treat this condition.

## 2012-02-23 NOTE — ED Provider Notes (Signed)
History     CSN: [id number]  Arrival date & time 02/23/12  1237   First MD Initiated Contact with Patient 02/23/12 1250      Chief Complaint  Patient presents with  . Chest Pain    (Consider location/radiation/quality/duration/timing/severity/associated sxs/prior treatment) HPI Comments: Patient says that for the past week he has had chest pain. He feels on both sides of his chest anteriorly. It is worse when he takes a deep breath or coughs. He has been coughing productively. He's had fever, but has not measured it. He was last hospitalized for pneumonia about a year ago. Also, he is noted that his feet have been swollen for the past couple of weeks. Where he had not improved over a week's time he sought evaluation.  Patient is a 77 y.o. male presenting with chest pain. The history is provided by the patient. No language interpreter was used.  Chest Pain The chest pain began 5 - 7 days ago. Episode Length: 30. Chest pain occurs intermittently. The chest pain is unchanged. The pain is associated with breathing and coughing. At its most intense, the pain is at 1/10. The pain is currently at 1/10. The severity of the pain is mild. The quality of the pain is described as sharp and pleuritic. The pain does not radiate. Chest pain is worsened by deep breathing. Primary symptoms include a fever (he had a subjective sensation of fever but could not take his temperature.), shortness of breath and cough. Pertinent negatives for primary symptoms include no nausea and no vomiting. He tried nothing for the symptoms. Risk factors include male gender and being elderly. Past medical history comments: Pneumonia; prior ablation for atrial fibrillation.  Cardiac catheterization: cardiac catheterization and ablation.     Past Medical History  Diagnosis Date  . Atrial fibrillation     coumadin therapy;  echo 1/10: EF 65-70%, mild diast dysfxn  . COPD (chronic obstructive pulmonary disease)     admitted 5/11   . Hyperlipidemia   . GERD (gastroesophageal reflux disease)   . Allergic rhinitis   . Asthma   . Anxiety   . Hiatal hernia   . CAD (coronary artery disease)     cath 7/09: pCFX 25%, mRCA 25%, EF 60%;  Myoview 6/11: low risk, EF 67%  . History of pneumonia   . Sebaceous cyst     scalp  . Neck pain   . SVT (supraventricular tachycardia)     s/p RFCA 05/2010  . Emphysema   . First degree AV block   . History of TIAs     Past Surgical History  Procedure Date  . Hernia repair     RIH  . Cardiac surgery     a fib surgery  . Knee surgery 1981  . Cystectomy 2012    removed from head  . Back surgery 1980    Family History  Problem Relation Age of Onset  . Hypertension Mother   . COPD Father   . COPD Sister   . Cancer Brother     lung    History  Substance Use Topics  . Smoking status: Former Smoker -- 3.0 packs/day for 40 years    Types: Cigarettes    Quit date: 09/13/1995  . Smokeless tobacco: Not on file  . Alcohol Use: No      Review of Systems  Constitutional: Positive for fever (he had a subjective sensation of fever but could not take his temperature.) and chills.  HENT: Negative.  Eyes: Negative.   Respiratory: Positive for cough and shortness of breath.   Cardiovascular: Positive for chest pain.  Gastrointestinal: Positive for diarrhea. Negative for nausea and vomiting.  Genitourinary: Negative.   Musculoskeletal: Negative.   Skin: Negative.   Neurological: Negative.   Psychiatric/Behavioral: Negative.     Allergies  Review of patient's allergies indicates no known allergies.  Home Medications   Current Outpatient Rx  Name Route Sig Dispense Refill  . ALBUTEROL SULFATE (2.5 MG/3ML) 0.083% IN NEBU Nebulization Take 2.5 mg by nebulization every 4 (four) hours as needed. For shortness of breath    . ALPRAZOLAM 0.5 MG PO TABS Oral Take 0.5 mg by mouth 2 (two) times daily.      Marland Kitchen DILTIAZEM HCL ER COATED BEADS 120 MG PO CP24 Oral Take 1 capsule  (120 mg total) by mouth daily. 30 capsule 5    90 day supply is acceptable  . ESOMEPRAZOLE MAGNESIUM 40 MG PO CPDR Oral Take 40 mg by mouth daily.     Marland Kitchen FAMOTIDINE 20 MG PO TABS  One at bedtime    . MOMETASONE FURO-FORMOTEROL FUM 200-5 MCG/ACT IN AERO  Take 2 puffs first thing in am and then another 2 puffs about 12 hours later.    Marland Kitchen POTASSIUM CHLORIDE ER 10 MEQ PO TBCR Oral Take 1 tablet (10 mEq total) by mouth 2 (two) times daily. 30 tablet 6  . TRAMADOL HCL 50 MG PO TABS  Take 1 to 2 tabs every 4 hours as needed for cough or pain 40 tablet 1  . WARFARIN SODIUM 2 MG PO TABS Oral Take 2 mg by mouth daily. Takes 1 tablet daily except on Wed takes 2 tablets       BP 128/69  Pulse 67  Temp 97.6 F (36.4 C) (Oral)  Resp 18  Ht 5\' 11"  (1.803 m)  Wt 204 lb (92.534 kg)  BMI 28.45 kg/m2  SpO2 92%  Physical Exam  Nursing note and vitals reviewed. Constitutional: He is oriented to person, place, and time. He appears well-developed and well-nourished. No distress.  HENT:  Head: Normocephalic and atraumatic.  Right Ear: External ear normal.  Left Ear: External ear normal.  Mouth/Throat: Oropharynx is clear and moist.  Eyes: Conjunctivae and EOM are normal. Pupils are equal, round, and reactive to light.  Neck: Normal range of motion. Neck supple.  Cardiovascular: Normal rate, regular rhythm and normal heart sounds.   Pulmonary/Chest: Effort normal and breath sounds normal.  Abdominal: Soft. Bowel sounds are normal.  Musculoskeletal: Normal range of motion. He exhibits no edema and no tenderness.  Neurological: He is alert and oriented to person, place, and time.       No sensory or motor deficit.  Skin: Skin is warm and dry.  Psychiatric: He has a normal mood and affect. His behavior is normal.    ED Course  Procedures (including critical care time)    1:02 PM  Date: 02/23/2012  Rate: 67  Rhythm: normal sinus rhythm and premature atrial contractions (PAC)  QRS Axis: normal   Intervals: PR prolonged QRS: Low voltage in frontal leads.  ST/T Wave abnormalities: normal  Conduction Disutrbances:first-degree A-V block   Narrative Interpretation: Abnormal EKG.  Old EKG Reviewed: unchanged  4:07 PM Patient was seen and had physical examination. There was a prolonged wait for his troponin I to come back.  4:44 PM Results for orders placed during the hospital encounter of 02/23/12  CBC      Component Value  Range   WBC 8.4  4.0 - 10.5 K/uL   RBC 4.36  4.22 - 5.81 MIL/uL   Hemoglobin 12.9 (*) 13.0 - 17.0 g/dL   HCT 40.9 (*) 81.1 - 91.4 %   MCV 88.5  78.0 - 100.0 fL   MCH 29.6  26.0 - 34.0 pg   MCHC 33.4  30.0 - 36.0 g/dL   RDW 78.2  95.6 - 21.3 %   Platelets 237  150 - 400 K/uL  DIFFERENTIAL      Component Value Range   Neutrophils Relative 65  43 - 77 %   Neutro Abs 5.4  1.7 - 7.7 K/uL   Lymphocytes Relative 26  12 - 46 %   Lymphs Abs 2.2  0.7 - 4.0 K/uL   Monocytes Relative 7  3 - 12 %   Monocytes Absolute 0.5  0.1 - 1.0 K/uL   Eosinophils Relative 2  0 - 5 %   Eosinophils Absolute 0.2  0.0 - 0.7 K/uL   Basophils Relative 1  0 - 1 %   Basophils Absolute 0.0  0.0 - 0.1 K/uL  COMPREHENSIVE METABOLIC PANEL      Component Value Range   Sodium 140  135 - 145 mEq/L   Potassium 3.9  3.5 - 5.1 mEq/L   Chloride 105  96 - 112 mEq/L   CO2 27  19 - 32 mEq/L   Glucose, Bld 96  70 - 99 mg/dL   BUN 13  6 - 23 mg/dL   Creatinine, Ser 0.86  0.50 - 1.35 mg/dL   Calcium 9.2  8.4 - 57.8 mg/dL   Total Protein 6.3  6.0 - 8.3 g/dL   Albumin 3.5  3.5 - 5.2 g/dL   AST 17  0 - 37 U/L   ALT 9  0 - 53 U/L   Alkaline Phosphatase 71  39 - 117 U/L   Total Bilirubin 0.3  0.3 - 1.2 mg/dL   GFR calc non Af Amer 81 (*) >90 mL/min   GFR calc Af Amer >90  >90 mL/min  URINALYSIS, ROUTINE W REFLEX MICROSCOPIC      Component Value Range   Color, Urine YELLOW  YELLOW   APPearance CLOUDY (*) CLEAR   Specific Gravity, Urine 1.021  1.005 - 1.030   pH 7.0  5.0 - 8.0   Glucose, UA  NEGATIVE  NEGATIVE mg/dL   Hgb urine dipstick NEGATIVE  NEGATIVE   Bilirubin Urine NEGATIVE  NEGATIVE   Ketones, ur NEGATIVE  NEGATIVE mg/dL   Protein, ur NEGATIVE  NEGATIVE mg/dL   Urobilinogen, UA 1.0  0.0 - 1.0 mg/dL   Nitrite NEGATIVE  NEGATIVE   Leukocytes, UA SMALL (*) NEGATIVE  PROTIME-INR      Component Value Range   Prothrombin Time 27.5 (*) 11.6 - 15.2 seconds   INR 2.51 (*) 0.00 - 1.49  APTT      Component Value Range   aPTT 43 (*) 24 - 37 seconds  URINE MICROSCOPIC-ADD ON      Component Value Range   WBC, UA 7-10  <3 WBC/hpf   RBC / HPF 0-2  <3 RBC/hpf   Bacteria, UA FEW (*) RARE   Urine-Other MUCOUS PRESENT    POCT I-STAT TROPONIN I      Component Value Range   Troponin i, poc 0.01  0.00 - 0.08 ng/mL   Comment 3            Dg Chest 2 View  02/23/2012  *  RADIOLOGY REPORT*  Clinical Data: Pleuritic chest pain  CHEST - 2 VIEW  Comparison: 09/08/2011  Findings: Cardiomediastinal silhouette is stable.  Mild hyperinflation again noted.  No acute infiltrate or pulmonary edema.  Stable calcified granuloma left midlung.  IMPRESSION: No active disease.  Hyperinflation again noted.  Original Report Authenticated By: Natasha Mead, M.D.    Lab tests reassuringly normal.  I reviewed the lab results with pt, and advised him to take a Z--pak.  1. Pleurisy      Carleene Cooper III, MD 02/23/12 [phone]

## 2012-02-23 NOTE — ED Notes (Signed)
Pt presents to ED via EMS with c/o of chest pain.  Onset x4 days ago with increase in intensity of pain.  Given 2gm of NTG paste to right chest wall; 2 NTG sprays; ASA 324mg  chewed.

## 2012-07-29 ENCOUNTER — Emergency Department (HOSPITAL_BASED_OUTPATIENT_CLINIC_OR_DEPARTMENT_OTHER): Payer: Medicare Other

## 2012-07-29 ENCOUNTER — Encounter (HOSPITAL_BASED_OUTPATIENT_CLINIC_OR_DEPARTMENT_OTHER): Payer: Self-pay | Admitting: Emergency Medicine

## 2012-07-29 ENCOUNTER — Emergency Department (HOSPITAL_BASED_OUTPATIENT_CLINIC_OR_DEPARTMENT_OTHER)
Admission: EM | Admit: 2012-07-29 | Discharge: 2012-07-29 | Disposition: A | Discharge status: Home / Self Care | Payer: Medicare Other | Attending: Emergency Medicine | Admitting: Emergency Medicine

## 2012-07-29 DIAGNOSIS — Z8719 Personal history of other diseases of the digestive system: Secondary | ICD-10-CM | POA: Insufficient documentation

## 2012-07-29 DIAGNOSIS — R51 Headache: Secondary | ICD-10-CM | POA: Insufficient documentation

## 2012-07-29 DIAGNOSIS — Z872 Personal history of diseases of the skin and subcutaneous tissue: Secondary | ICD-10-CM | POA: Insufficient documentation

## 2012-07-29 DIAGNOSIS — J449 Chronic obstructive pulmonary disease, unspecified: Secondary | ICD-10-CM | POA: Insufficient documentation

## 2012-07-29 DIAGNOSIS — F411 Generalized anxiety disorder: Secondary | ICD-10-CM | POA: Insufficient documentation

## 2012-07-29 DIAGNOSIS — J4489 Other specified chronic obstructive pulmonary disease: Secondary | ICD-10-CM | POA: Insufficient documentation

## 2012-07-29 DIAGNOSIS — Z87891 Personal history of nicotine dependence: Secondary | ICD-10-CM | POA: Insufficient documentation

## 2012-07-29 DIAGNOSIS — Z8673 Personal history of transient ischemic attack (TIA), and cerebral infarction without residual deficits: Secondary | ICD-10-CM | POA: Insufficient documentation

## 2012-07-29 DIAGNOSIS — E785 Hyperlipidemia, unspecified: Secondary | ICD-10-CM | POA: Insufficient documentation

## 2012-07-29 DIAGNOSIS — I10 Essential (primary) hypertension: Secondary | ICD-10-CM | POA: Insufficient documentation

## 2012-07-29 DIAGNOSIS — R059 Cough, unspecified: Secondary | ICD-10-CM | POA: Insufficient documentation

## 2012-07-29 DIAGNOSIS — Z8701 Personal history of pneumonia (recurrent): Secondary | ICD-10-CM | POA: Insufficient documentation

## 2012-07-29 DIAGNOSIS — R05 Cough: Secondary | ICD-10-CM | POA: Insufficient documentation

## 2012-07-29 DIAGNOSIS — Z79899 Other long term (current) drug therapy: Secondary | ICD-10-CM | POA: Insufficient documentation

## 2012-07-29 DIAGNOSIS — R079 Chest pain, unspecified: Secondary | ICD-10-CM | POA: Insufficient documentation

## 2012-07-29 DIAGNOSIS — Z7901 Long term (current) use of anticoagulants: Secondary | ICD-10-CM | POA: Insufficient documentation

## 2012-07-29 HISTORY — DX: Essential (primary) hypertension: I10

## 2012-07-29 LAB — COMPREHENSIVE METABOLIC PANEL
ALT: 12 U/L (ref 0–53)
AST: 17 U/L (ref 0–37)
Albumin: 3.9 g/dL (ref 3.5–5.2)
Alkaline Phosphatase: 91 U/L (ref 39–117)
BUN: 10 mg/dL (ref 6–23)
Chloride: 101 mEq/L (ref 96–112)
Potassium: 3.7 mEq/L (ref 3.5–5.1)
Sodium: 139 mEq/L (ref 135–145)
Total Bilirubin: 0.5 mg/dL (ref 0.3–1.2)

## 2012-07-29 LAB — TROPONIN I: Troponin I: <0.3 ng/mL (ref <0.30)

## 2012-07-29 LAB — URINALYSIS, ROUTINE W REFLEX MICROSCOPIC
Glucose, UA: NEGATIVE mg/dL
Hgb urine dipstick: NEGATIVE
Leukocytes, UA: NEGATIVE
Specific Gravity, Urine: 1.018 (ref 1.005–1.030)
pH: 7 (ref 5.0–8.0)

## 2012-07-29 LAB — CBC
HCT: 40.3 % (ref 39.0–52.0)
Hemoglobin: 13.8 g/dL (ref 13.0–17.0)
RDW: 13.2 % (ref 11.5–15.5)
WBC: 8.3 10*3/uL (ref 4.0–10.5)

## 2012-07-29 LAB — PROTIME-INR: Prothrombin Time: 14.4 seconds (ref 11.6–15.2)

## 2012-07-29 MED ORDER — ALBUTEROL SULFATE (5 MG/ML) 0.5% IN NEBU
5.0000 mg | INHALATION_SOLUTION | Freq: Once | RESPIRATORY_TRACT | Status: AC
  Administered 2012-07-29: 5 mg via RESPIRATORY_TRACT
  Filled 2012-07-29: qty 1

## 2012-07-29 MED ORDER — HYDROCODONE-ACETAMINOPHEN 5-325 MG PO TABS
1.0000 | ORAL_TABLET | Freq: Once | ORAL | Status: AC
  Administered 2012-07-29: 1 via ORAL
  Filled 2012-07-29: qty 1

## 2012-07-29 MED ORDER — IPRATROPIUM BROMIDE 0.02 % IN SOLN
0.5000 mg | Freq: Once | RESPIRATORY_TRACT | Status: AC
  Administered 2012-07-29: 0.5 mg via RESPIRATORY_TRACT
  Filled 2012-07-29: qty 2.5

## 2012-07-29 NOTE — ED Notes (Signed)
Pt returned from CT.  No change in pt assessment.  RRT at bedside for blood collection.

## 2012-07-29 NOTE — ED Notes (Signed)
Urinal provided.

## 2012-07-29 NOTE — ED Notes (Signed)
Pt states he was lifting tires off of the truck on Thursday.  Pt started to have left sided chest pain radiating to left shoulder.  Some sob.  No Nausea or diaphoresis.  Pt also having productive cough, which is chronic according to pt.  Pt fell on Wednesday, "blacked out" while walking up the steps.  Pt has pain to left side and back from fall.

## 2012-07-29 NOTE — ED Notes (Signed)
Patient transported to CT 

## 2012-07-29 NOTE — ED Provider Notes (Signed)
History     CSN: [id number]  Arrival date & time 07/29/12  4098   First MD Initiated Contact with Patient 07/29/12 [phone]      Chief Complaint  Patient presents with  . Chest Pain    (Consider location/radiation/quality/duration/timing/severity/associated sxs/prior treatment) Patient is a 77 y.o. male presenting with chest pain. The history is provided by the patient.  Chest Pain Primary symptoms include cough. Pertinent negatives for primary symptoms include no fever, no shortness of breath, no palpitations, no abdominal pain and no vomiting.  Pertinent negatives for associated symptoms include no numbness and no weakness.   pt states since lifting tires off truck 3 days ago, left cp. Constant. Dull. Feels in left chest and shoulder area. No radiation to neck, jaw, arms, or back. No associated sob, nv or diaphoresis. No exacerbating or alleviating factors. At rest. Pain is not pleuritic. Denies hx cad. Hx afib, although states normally in normal rhythm. No palpitations. On coumadin, states currently holding as blood had gotten too thin on coumadin. Pt also notes hx asthma and copd. Non smoker. States chronic cough but increased in past week. No fever or chills. Denies hx chf. No orthopnea or pnd, no leg pain or swelling. No dvt or pe hx. States 4 days ago was walking up steps, falling forward. ?brief loc. States did not fall down steps, fell forward, got up, and continued about his normal activity. States gets frequent headaches, not necessarily worse since fall.     Past Medical History  Diagnosis Date  . Atrial fibrillation     coumadin therapy;  echo 1/10: EF 65-70%, mild diast dysfxn  . COPD (chronic obstructive pulmonary disease)     admitted 5/11  . Hyperlipidemia   . GERD (gastroesophageal reflux disease)   . Allergic rhinitis   . Asthma   . Anxiety   . Hiatal hernia   . CAD (coronary artery disease)     cath 7/09: pCFX 25%, mRCA 25%, EF 60%;  Myoview 6/11: low risk, EF  67%  . History of pneumonia   . Sebaceous cyst     scalp  . Neck pain   . SVT (supraventricular tachycardia)     s/p RFCA 05/2010  . Emphysema   . First degree AV block   . History of TIAs   . Hypertension     Past Surgical History  Procedure Date  . Hernia repair     RIH  . Cardiac surgery     a fib surgery  . Knee surgery 1981  . Cystectomy 2012    removed from head  . Back surgery 1980    Family History  Problem Relation Age of Onset  . Hypertension Mother   . COPD Father   . COPD Sister   . Cancer Brother     lung    History  Substance Use Topics  . Smoking status: Former Smoker -- 3.0 packs/day for 40 years    Types: Cigarettes    Quit date: 09/13/1995  . Smokeless tobacco: Not on file  . Alcohol Use: No      Review of Systems  Constitutional: Negative for fever and chills.  HENT: Negative for neck pain.   Eyes: Negative for visual disturbance.  Respiratory: Positive for cough. Negative for shortness of breath.   Cardiovascular: Positive for chest pain. Negative for palpitations and leg swelling.  Gastrointestinal: Negative for vomiting, abdominal pain, diarrhea and blood in stool.  Genitourinary: Negative for flank pain.  Musculoskeletal: Negative  for back pain.  Skin: Negative for rash.  Neurological: Positive for headaches. Negative for weakness and numbness.  Hematological:       On coumadin.   Psychiatric/Behavioral: Negative for confusion.    Allergies  Review of patient's allergies indicates no known allergies.  Home Medications   Current Outpatient Rx  Name  Route  Sig  Dispense  Refill  . ALBUTEROL SULFATE (2.5 MG/3ML) 0.083% IN NEBU   Nebulization   Take 2.5 mg by nebulization every 4 (four) hours as needed. For shortness of breath         . ALPRAZOLAM 0.5 MG PO TABS   Oral   Take 0.5 mg by mouth 2 (two) times daily.           Marland Kitchen DILTIAZEM HCL ER COATED BEADS 120 MG PO CP24   Oral   Take 1 capsule (120 mg total) by mouth  daily.   30 capsule   5     90 day supply is acceptable   . MOMETASONE FURO-FORMOTEROL FUM 200-5 MCG/ACT IN AERO      Take 2 puffs first thing in am and then another 2 puffs about 12 hours later.         Marland Kitchen POTASSIUM CHLORIDE ER 10 MEQ PO TBCR   Oral   Take 1 tablet (10 mEq total) by mouth 2 (two) times daily.   30 tablet   6   . WARFARIN SODIUM 2 MG PO TABS   Oral   Take 2 mg by mouth daily. Takes 1 tablet daily except on Wed takes 2 tablets            BP 157/80  Pulse 86  Temp 98.2 F (36.8 C) (Oral)  Resp 15  SpO2 95%  Physical Exam  Nursing note and vitals reviewed. Constitutional: He is oriented to person, place, and time. He appears well-developed and well-nourished. No distress.  HENT:  Head: Atraumatic.  Eyes: Conjunctivae normal are normal. Pupils are equal, round, and reactive to light.  Neck: Neck supple. No tracheal deviation present.  Cardiovascular: Normal rate, regular rhythm, normal heart sounds and intact distal pulses.   Pulmonary/Chest: Effort normal and breath sounds normal. No accessory muscle usage. No respiratory distress. He exhibits tenderness.  Abdominal: Soft. Bowel sounds are normal. He exhibits no distension and no mass. There is no tenderness. There is no rebound and no guarding.  Musculoskeletal: Normal range of motion. He exhibits no edema and no tenderness.       CTLS spine, non tender, aligned, no step off.   Neurological: He is alert and oriented to person, place, and time.       Motor intact bil.   Skin: Skin is warm and dry.  Psychiatric: He has a normal mood and affect.    ED Course  Procedures (including critical care time)  Results for orders placed during the hospital encounter of 07/29/12  CBC      Component Value Range   WBC 8.3  4.0 - 10.5 K/uL   RBC 4.70  4.22 - 5.81 MIL/uL   Hemoglobin 13.8  13.0 - 17.0 g/dL   HCT 16.1  09.6 - 04.5 %   MCV 85.7  78.0 - 100.0 fL   MCH 29.4  26.0 - 34.0 pg   MCHC 34.2  30.0 -  36.0 g/dL   RDW 40.9  81.1 - 91.4 %   Platelets 229  150 - 400 K/uL  COMPREHENSIVE METABOLIC PANEL  Component Value Range   Sodium 139  135 - 145 mEq/L   Potassium 3.7  3.5 - 5.1 mEq/L   Chloride 101  96 - 112 mEq/L   CO2 28  19 - 32 mEq/L   Glucose, Bld 110 (*) 70 - 99 mg/dL   BUN 10  6 - 23 mg/dL   Creatinine, Ser 9.81  0.50 - 1.35 mg/dL   Calcium 9.0  8.4 - 19.1 mg/dL   Total Protein 6.7  6.0 - 8.3 g/dL   Albumin 3.9  3.5 - 5.2 g/dL   AST 17  0 - 37 U/L   ALT 12  0 - 53 U/L   Alkaline Phosphatase 91  39 - 117 U/L   Total Bilirubin 0.5  0.3 - 1.2 mg/dL   GFR calc non Af Amer 64 (*) >90 mL/min   GFR calc Af Amer 74 (*) >90 mL/min  PROTIME-INR      Component Value Range   Prothrombin Time 14.4  11.6 - 15.2 seconds   INR 1.14  0.00 - 1.49  TROPONIN I      Component Value Range   Troponin I <0.30  <0.30 ng/mL  URINALYSIS, ROUTINE W REFLEX MICROSCOPIC      Component Value Range   Color, Urine YELLOW  YELLOW   APPearance CLEAR  CLEAR   Specific Gravity, Urine 1.018  1.005 - 1.030   pH 7.0  5.0 - 8.0   Glucose, UA NEGATIVE  NEGATIVE mg/dL   Hgb urine dipstick NEGATIVE  NEGATIVE   Bilirubin Urine SMALL (*) NEGATIVE   Ketones, ur NEGATIVE  NEGATIVE mg/dL   Protein, ur NEGATIVE  NEGATIVE mg/dL   Urobilinogen, UA 2.0 (*) 0.0 - 1.0 mg/dL   Nitrite NEGATIVE  NEGATIVE   Leukocytes, UA NEGATIVE  NEGATIVE  TROPONIN I      Component Value Range   Troponin I <0.30  <0.30 ng/mL   Dg Chest 2 View  07/29/2012  *RADIOLOGY REPORT*  Clinical Data: Cough, left chest dated  CHEST - 2 VIEW  Comparison: 02/23/2012  Findings: Lungs are essentially clear. No pleural effusion or pneumothorax.  Stable tiny calcified granuloma in the left midlung.  Cardiomediastinal silhouette is within normal limits.  Degenerative changes of the visualized thoracolumbar spine.  IMPRESSION: No evidence of acute cardiopulmonary disease.   Original Report Authenticated By: Charline Bills, M.D.    Ct Head Wo  Contrast  07/29/2012  *RADIOLOGY REPORT*  Clinical Data: Fall 4 days ago.  Syncopal episode with loss of consciousness.  Hit forehead.  High blood pressure.  Atrial fibrillation.  CT HEAD WITHOUT CONTRAST  Technique:  Contiguous axial images were obtained from the base of the skull through the vertex without contrast.  Comparison: 09/22/2008.  Findings: No skull fracture or intracranial hemorrhage.  Small vessel disease type changes without CT evidence of large acute infarct.  No intracranial mass lesion detected on this unenhanced exam.  Global atrophy without hydrocephalus.  Minimal polypoid opacification maxillary sinuses.  IMPRESSION: No acute abnormality.  Please see above.   Original Report Authenticated By: Lacy Duverney, M.D.        MDM  Iv ns. Labs. Cxr. Ecg.  Reviewed nursing notes and prior charts for additional history.    Date: 07/29/2012  Rate: 83  Rhythm: normal sinus rhythm  QRS Axis: normal  Intervals: normal  ST/T Wave abnormalities: normal  Conduction Disutrbances:none  Narrative Interpretation:   Old EKG Reviewed: unchanged  Pt does not increased headaches post recent fall,  on coumadin, will get head ct.  Given cp, will plan recheck troponin 3 hours post initial. Pt updated.   Sl wheezing on recheck. Alb and atrovent neb.   Chest wall tenderness, vicodin po.  Recheck pt comfortable. No increased wob.   After symptoms present x > 24 hrs, trop x 2 negative. ecg neg acute, chest xray neg, +chest wall tenderness. On review records note made of cath 2009 w minimal coronary disease.  No increased wob.   Discussed labs, xrays, ct w pt.  Will have pt f/u pcp and card in next 1-2 days.          Suzi Roots, MD 07/29/12 [phone]

## 2012-07-29 NOTE — ED Notes (Signed)
MD at bedside. 

## 2012-07-31 ENCOUNTER — Emergency Department (HOSPITAL_COMMUNITY)
Admission: EM | Admit: 2012-07-31 | Discharge: 2012-07-31 | Disposition: A | Discharge status: Home / Self Care | Payer: Medicare Other | Attending: Emergency Medicine | Admitting: Emergency Medicine

## 2012-07-31 ENCOUNTER — Encounter (HOSPITAL_COMMUNITY): Payer: Self-pay | Admitting: *Deleted

## 2012-07-31 ENCOUNTER — Emergency Department (HOSPITAL_COMMUNITY): Payer: Medicare Other

## 2012-07-31 DIAGNOSIS — F411 Generalized anxiety disorder: Secondary | ICD-10-CM | POA: Insufficient documentation

## 2012-07-31 DIAGNOSIS — K219 Gastro-esophageal reflux disease without esophagitis: Secondary | ICD-10-CM | POA: Insufficient documentation

## 2012-07-31 DIAGNOSIS — I10 Essential (primary) hypertension: Secondary | ICD-10-CM | POA: Insufficient documentation

## 2012-07-31 DIAGNOSIS — Z7901 Long term (current) use of anticoagulants: Secondary | ICD-10-CM | POA: Insufficient documentation

## 2012-07-31 DIAGNOSIS — S298XXA Other specified injuries of thorax, initial encounter: Secondary | ICD-10-CM | POA: Insufficient documentation

## 2012-07-31 DIAGNOSIS — I4891 Unspecified atrial fibrillation: Secondary | ICD-10-CM | POA: Insufficient documentation

## 2012-07-31 DIAGNOSIS — J4489 Other specified chronic obstructive pulmonary disease: Secondary | ICD-10-CM | POA: Insufficient documentation

## 2012-07-31 DIAGNOSIS — Y9301 Activity, walking, marching and hiking: Secondary | ICD-10-CM | POA: Insufficient documentation

## 2012-07-31 DIAGNOSIS — F0781 Postconcussional syndrome: Secondary | ICD-10-CM

## 2012-07-31 DIAGNOSIS — Y929 Unspecified place or not applicable: Secondary | ICD-10-CM | POA: Insufficient documentation

## 2012-07-31 DIAGNOSIS — I440 Atrioventricular block, first degree: Secondary | ICD-10-CM | POA: Insufficient documentation

## 2012-07-31 DIAGNOSIS — E785 Hyperlipidemia, unspecified: Secondary | ICD-10-CM | POA: Insufficient documentation

## 2012-07-31 DIAGNOSIS — W1809XA Striking against other object with subsequent fall, initial encounter: Secondary | ICD-10-CM | POA: Insufficient documentation

## 2012-07-31 DIAGNOSIS — Z8701 Personal history of pneumonia (recurrent): Secondary | ICD-10-CM | POA: Insufficient documentation

## 2012-07-31 DIAGNOSIS — Z8679 Personal history of other diseases of the circulatory system: Secondary | ICD-10-CM | POA: Insufficient documentation

## 2012-07-31 DIAGNOSIS — Z79899 Other long term (current) drug therapy: Secondary | ICD-10-CM | POA: Insufficient documentation

## 2012-07-31 DIAGNOSIS — Z8673 Personal history of transient ischemic attack (TIA), and cerebral infarction without residual deficits: Secondary | ICD-10-CM | POA: Insufficient documentation

## 2012-07-31 DIAGNOSIS — I2510 Atherosclerotic heart disease of native coronary artery without angina pectoris: Secondary | ICD-10-CM | POA: Insufficient documentation

## 2012-07-31 DIAGNOSIS — Z87891 Personal history of nicotine dependence: Secondary | ICD-10-CM | POA: Insufficient documentation

## 2012-07-31 DIAGNOSIS — J449 Chronic obstructive pulmonary disease, unspecified: Secondary | ICD-10-CM | POA: Insufficient documentation

## 2012-07-31 LAB — BASIC METABOLIC PANEL
BUN: 9 mg/dL (ref 6–23)
CO2: 30 mEq/L (ref 19–32)
Chloride: 101 mEq/L (ref 96–112)
Creatinine, Ser: 0.92 mg/dL (ref 0.50–1.35)
GFR calc Af Amer: >90 mL/min (ref >90)
Potassium: 3.8 mEq/L (ref 3.5–5.1)

## 2012-07-31 LAB — CBC WITH DIFFERENTIAL/PLATELET
Basophils Relative: 0 % (ref 0–1)
HCT: 42.7 % (ref 39.0–52.0)
Hemoglobin: 14.7 g/dL (ref 13.0–17.0)
Lymphocytes Relative: 29 % (ref 12–46)
Monocytes Absolute: 0.7 10*3/uL (ref 0.1–1.0)
Monocytes Relative: 10 % (ref 3–12)
Neutro Abs: 4.1 10*3/uL (ref 1.7–7.7)
Neutrophils Relative %: 56 % (ref 43–77)
RBC: 4.92 MIL/uL (ref 4.22–5.81)
WBC: 7.3 10*3/uL (ref 4.0–10.5)

## 2012-07-31 LAB — PROTIME-INR: Prothrombin Time: 16.4 seconds — ABNORMAL HIGH (ref 11.6–15.2)

## 2012-07-31 MED ORDER — ONDANSETRON HCL 4 MG/2ML IJ SOLN
4.0000 mg | Freq: Once | INTRAMUSCULAR | Status: AC
  Administered 2012-07-31: 4 mg via INTRAVENOUS
  Filled 2012-07-31: qty 2

## 2012-07-31 MED ORDER — SODIUM CHLORIDE 0.9 % IV BOLUS (SEPSIS)
500.0000 mL | Freq: Once | INTRAVENOUS | Status: AC
  Administered 2012-07-31: 500 mL via INTRAVENOUS

## 2012-07-31 MED ORDER — MORPHINE SULFATE 4 MG/ML IJ SOLN
4.0000 mg | Freq: Once | INTRAMUSCULAR | Status: AC
  Administered 2012-07-31: 4 mg via INTRAVENOUS
  Filled 2012-07-31: qty 1

## 2012-07-31 NOTE — ED Notes (Signed)
Patient fell on 07-27-12 and hit head.  Had negative CT at [facility] ER on the 07/27/12.  Family states "patient doesn't walk well, and patient complains of continued headache".

## 2012-07-31 NOTE — ED Provider Notes (Signed)
Medical screening examination/treatment/procedure(s) were conducted as a shared visit with non-physician practitioner(s) and myself.  I personally evaluated the patient during the encounter  Given the patient's use of Coumadin a repeat head CT was performed which demonstrates no evidence of acute pathology.  I suspect this is concussion.  He has a normal neurologic exam at this time.  Laboratory studies are normal.  Discharge home with close PCP followup.  He understands to return to the ER for new or worsening symptoms  1. Post concussive syndrome    Dg Chest 2 View  07/31/2012  *RADIOLOGY REPORT*  Clinical Data: Chest wall pain.  CHEST - 2 VIEW  Comparison: July 29, 2012.  Findings: Cardiomediastinal silhouette appears normal.  No acute pulmonary disease is noted.  Bony thorax is intact.  Small calcified granuloma is noted in left lung.  IMPRESSION: No acute cardiopulmonary abnormality seen.   Original Report Authenticated By: Lupita Raider.,  M.D.    Dg Chest 2 View  07/29/2012  *RADIOLOGY REPORT*  Clinical Data: Cough, left chest dated  CHEST - 2 VIEW  Comparison: 02/23/2012  Findings: Lungs are essentially clear. No pleural effusion or pneumothorax.  Stable tiny calcified granuloma in the left midlung.  Cardiomediastinal silhouette is within normal limits.  Degenerative changes of the visualized thoracolumbar spine.  IMPRESSION: No evidence of acute cardiopulmonary disease.   Original Report Authenticated By: Charline Bills, M.D.    Ct Head Wo Contrast  07/31/2012  *RADIOLOGY REPORT*  Clinical Data: Syncope, fall.  CT HEAD WITHOUT CONTRAST  Technique:  Contiguous axial images were obtained from the base of the skull through the vertex without contrast.  Comparison: July 29, 2012.  Findings: Bony calvarium appears intact.  Mild diffuse cortical atrophy is noted.  No mass effect or midline shift is noted.  Mild chronic ischemic white matter disease is noted.  There is no evidence of  mass lesion, hemorrhage or acute infarction.  IMPRESSION: Mild diffuse cortical atrophy.  Mild chronic ischemic white matter disease.  No acute intracranial abnormality seen.   Original Report Authenticated By: Lupita Raider.,  M.D.    Ct Head Wo Contrast  07/29/2012  *RADIOLOGY REPORT*  Clinical Data: Fall 4 days ago.  Syncopal episode with loss of consciousness.  Hit forehead.  High blood pressure.  Atrial fibrillation.  CT HEAD WITHOUT CONTRAST  Technique:  Contiguous axial images were obtained from the base of the skull through the vertex without contrast.  Comparison: 09/22/2008.  Findings: No skull fracture or intracranial hemorrhage.  Small vessel disease type changes without CT evidence of large acute infarct.  No intracranial mass lesion detected on this unenhanced exam.  Global atrophy without hydrocephalus.  Minimal polypoid opacification maxillary sinuses.  IMPRESSION: No acute abnormality.  Please see above.   Original Report Authenticated By: Lacy Duverney, M.D.   I personally reviewed the imaging tests through PACS system I reviewed available ER/hospitalization records through the EMR   Lyanne Co, MD 07/31/12 [phone]

## 2012-07-31 NOTE — ED Provider Notes (Signed)
History     CSN: [id number]  Arrival date & time 07/31/12  1215   First MD Initiated Contact with Patient 07/31/12 1249      Chief Complaint  Patient presents with  . Headache    (Consider location/radiation/quality/duration/timing/severity/associated sxs/prior treatment) HPI  77 year old male presents for evaluation of headache and syncope. Patient was accompanied by a family member who is at bedside. Hx was obtained through patient and family member.  Per family member, pt was walking up the stairs when he syncopized and fell forward, hitting his forehead and chest against the stair.  Pt subsequently c/o pain to his forehead and tenderness to his left chest.  He was seen in the ER 2 days later for evaluation of headache and chest pain. Head CT and CXR were obtained showing now acute findings.  However, per family member pt has since continues to endorse persistent headache and soreness to his chest.  He is less active, not eating or drinking as usual, having difficulty ambulating and appears more confused than usual.  Pt endorsed sensation of lightheadedness and dizziness, worsening with walking.  He denies fever, chills, n/v/d, sob, back pain, abd pain, or dysuria.  He was seen at his PCP office today for his complaint and was sent to ER for further care.  He normally does not use home O2 but for the past few days he has been dependent on it, however denies SOB.    Past Medical History  Diagnosis Date  . Atrial fibrillation     coumadin therapy;  echo 1/10: EF 65-70%, mild diast dysfxn  . COPD (chronic obstructive pulmonary disease)     admitted 5/11  . Hyperlipidemia   . GERD (gastroesophageal reflux disease)   . Allergic rhinitis   . Asthma   . Anxiety   . Hiatal hernia   . CAD (coronary artery disease)     cath 7/09: pCFX 25%, mRCA 25%, EF 60%;  Myoview 6/11: low risk, EF 67%  . History of pneumonia   . Sebaceous cyst     scalp  . Neck pain   . SVT (supraventricular  tachycardia)     s/p RFCA 05/2010  . Emphysema   . First degree AV block   . History of TIAs   . Hypertension     Past Surgical History  Procedure Date  . Hernia repair     RIH  . Cardiac surgery     a fib surgery  . Knee surgery 1981  . Cystectomy 2012    removed from head  . Back surgery 1980  . Cardiac valve surgery     Family History  Problem Relation Age of Onset  . Hypertension Mother   . COPD Father   . COPD Sister   . Cancer Brother     lung    History  Substance Use Topics  . Smoking status: Former Smoker -- 3.0 packs/day for 40 years    Types: Cigarettes    Quit date: 09/13/1995  . Smokeless tobacco: Not on file  . Alcohol Use: No      Review of Systems  All other systems reviewed and are negative.    Allergies  Review of patient's allergies indicates no known allergies.  Home Medications   Current Outpatient Rx  Name  Route  Sig  Dispense  Refill  . ALBUTEROL SULFATE (2.5 MG/3ML) 0.083% IN NEBU   Nebulization   Take 2.5 mg by nebulization every 4 (four) hours as needed. For  shortness of breath         . ALPRAZOLAM 0.5 MG PO TABS   Oral   Take 0.5 mg by mouth 2 (two) times daily.          Marland Kitchen DILTIAZEM HCL ER COATED BEADS 120 MG PO CP24   Oral   Take 1 capsule (120 mg total) by mouth daily.   30 capsule   5     90 day supply is acceptable   . LEVOFLOXACIN 500 MG PO TABS   Oral   Take 500 mg by mouth daily. For 7 days         . POTASSIUM CHLORIDE ER 10 MEQ PO TBCR   Oral   Take 1 tablet (10 mEq total) by mouth 2 (two) times daily.   30 tablet   6   . WARFARIN SODIUM 2 MG PO TABS   Oral   Take 2-4 mg by mouth every evening. Takes 1 tablet daily except on Wed takes 2 tablets         . MOMETASONE FURO-FORMOTEROL FUM 200-5 MCG/ACT IN AERO   Inhalation   Inhale 2 puffs into the lungs every 12 (twelve) hours.           BP 140/80  Temp 98.4 F (36.9 C) (Oral)  Resp 22  SpO2 100%  Physical Exam  Nursing note and  vitals reviewed. Constitutional: He is oriented to person, place, and time. He appears well-developed and well-nourished. No distress.       Awake, alert, nontoxic appearance  HENT:  Head: Normocephalic and atraumatic.  Right Ear: External ear normal.  Left Ear: External ear normal.       Mild tenderness to L forehead without overlying skin changes or deformity.  No hemotympanum, midface tenderness, malocclusion, or septal hematoma.    Eyes: Conjunctivae normal and EOM are normal. Pupils are equal, round, and reactive to light. Right eye exhibits no discharge. Left eye exhibits no discharge.       No nystagmus  Neck: Normal range of motion. Neck supple.  Cardiovascular: Normal rate and regular rhythm.   Pulmonary/Chest: Effort normal. No respiratory distress. He has no wheezes. He has no rales. He exhibits no tenderness (tenderness to L anterior chest wall without crepitus or deformity noted.  ).  Abdominal: Soft. There is no tenderness. There is no rebound.  Musculoskeletal: He exhibits no edema and no tenderness.       ROM appears intact, no obvious focal weakness  Neurological: He is alert and oriented to person, place, and time. He has normal strength. He displays normal reflexes. No cranial nerve deficit. He displays a negative Romberg sign. Coordination and gait normal. GCS eye subscore is 4. GCS verbal subscore is 5. GCS motor subscore is 6.       Alert and oriented x 3  Skin: Skin is warm and dry. No rash noted.  Psychiatric: He has a normal mood and affect.    ED Course  Procedures (including critical care time)  Labs Reviewed - No data to display No results found.   No diagnosis found.   Date: 07/31/2012  Rate: 85  Rhythm: normal sinus rhythm  QRS Axis: normal  Intervals: PR prolonged  ST/T Wave abnormalities: normal  Conduction Disutrbances:first-degree A-V block   Narrative Interpretation:   Old EKG Reviewed: unchanged  Results for orders placed during the  hospital encounter of 07/31/12  CBC WITH DIFFERENTIAL      Component Value Range   WBC  7.3  4.0 - 10.5 K/uL   RBC 4.92  4.22 - 5.81 MIL/uL   Hemoglobin 14.7  13.0 - 17.0 g/dL   HCT 16.1  09.6 - 04.5 %   MCV 86.8  78.0 - 100.0 fL   MCH 29.9  26.0 - 34.0 pg   MCHC 34.4  30.0 - 36.0 g/dL   RDW 40.9  81.1 - 91.4 %   Platelets 225  150 - 400 K/uL   Neutrophils Relative 56  43 - 77 %   Neutro Abs 4.1  1.7 - 7.7 K/uL   Lymphocytes Relative 29  12 - 46 %   Lymphs Abs 2.1  0.7 - 4.0 K/uL   Monocytes Relative 10  3 - 12 %   Monocytes Absolute 0.7  0.1 - 1.0 K/uL   Eosinophils Relative 5  0 - 5 %   Eosinophils Absolute 0.4  0.0 - 0.7 K/uL   Basophils Relative 0  0 - 1 %   Basophils Absolute 0.0  0.0 - 0.1 K/uL  BASIC METABOLIC PANEL      Component Value Range   Sodium 139  135 - 145 mEq/L   Potassium 3.8  3.5 - 5.1 mEq/L   Chloride 101  96 - 112 mEq/L   CO2 30  19 - 32 mEq/L   Glucose, Bld 90  70 - 99 mg/dL   BUN 9  6 - 23 mg/dL   Creatinine, Ser 7.82  0.50 - 1.35 mg/dL   Calcium 9.5  8.4 - 95.6 mg/dL   GFR calc non Af Amer 80 (*) >90 mL/min   GFR calc Af Amer >90  >90 mL/min  TROPONIN I      Component Value Range   Troponin I <0.30  <0.30 ng/mL  PROTIME-INR      Component Value Range   Prothrombin Time 16.4 (*) 11.6 - 15.2 seconds   INR 1.35  0.00 - 1.49   Dg Chest 2 View  07/31/2012  *RADIOLOGY REPORT*  Clinical Data: Chest wall pain.  CHEST - 2 VIEW  Comparison: July 29, 2012.  Findings: Cardiomediastinal silhouette appears normal.  No acute pulmonary disease is noted.  Bony thorax is intact.  Small calcified granuloma is noted in left lung.  IMPRESSION: No acute cardiopulmonary abnormality seen.   Original Report Authenticated By: Lupita Raider.,  M.D.    Dg Chest 2 View  07/29/2012  *RADIOLOGY REPORT*  Clinical Data: Cough, left chest dated  CHEST - 2 VIEW  Comparison: 02/23/2012  Findings: Lungs are essentially clear. No pleural effusion or pneumothorax.  Stable tiny  calcified granuloma in the left midlung.  Cardiomediastinal silhouette is within normal limits.  Degenerative changes of the visualized thoracolumbar spine.  IMPRESSION: No evidence of acute cardiopulmonary disease.   Original Report Authenticated By: Charline Bills, M.D.    Ct Head Wo Contrast  07/31/2012  *RADIOLOGY REPORT*  Clinical Data: Syncope, fall.  CT HEAD WITHOUT CONTRAST  Technique:  Contiguous axial images were obtained from the base of the skull through the vertex without contrast.  Comparison: July 29, 2012.  Findings: Bony calvarium appears intact.  Mild diffuse cortical atrophy is noted.  No mass effect or midline shift is noted.  Mild chronic ischemic white matter disease is noted.  There is no evidence of mass lesion, hemorrhage or acute infarction.  IMPRESSION: Mild diffuse cortical atrophy.  Mild chronic ischemic white matter disease.  No acute intracranial abnormality seen.   Original Report Authenticated By: Roque Lias  Montez Hageman.,  M.D.    Ct Head Wo Contrast  07/29/2012  *RADIOLOGY REPORT*  Clinical Data: Fall 4 days ago.  Syncopal episode with loss of consciousness.  Hit forehead.  High blood pressure.  Atrial fibrillation.  CT HEAD WITHOUT CONTRAST  Technique:  Contiguous axial images were obtained from the base of the skull through the vertex without contrast.  Comparison: 09/22/2008.  Findings: No skull fracture or intracranial hemorrhage.  Small vessel disease type changes without CT evidence of large acute infarct.  No intracranial mass lesion detected on this unenhanced exam.  Global atrophy without hydrocephalus.  Minimal polypoid opacification maxillary sinuses.  IMPRESSION: No acute abnormality.  Please see above.   Original Report Authenticated By: Lacy Duverney, M.D.     1. concussion   MDM  Pt presents with chest wall pain, headache, and decreased ADL since he had a syncopal episode and fall nearly a week ago.  Family members notice that he is less active compare to  his baseline.    Pt may have post concussive syndrome, however will work him up for syncope.  He is on coumadin, will reorder head CT to r/o late bleed.  Care discussed with my attending.   3:50 PM Pt felt better after receiving IVF, pain meds and zofran.  He is A&Ox3.  Repeat headct and CXR unremarkable. Labs are unremarkable.  Pt is subtherapeutic with INR 1.35.  I recommend f/u with his PCP for reevaluation and recalibration of his coumadin.  My attending has seen and evaluate pt and felt he's stable to be d/c.  Pt agrees with plan.    BP 140/80  Temp 98.4 F (36.9 C) (Oral)  Resp 22  SpO2 100%  I have reviewed nursing notes and vital signs. I personally reviewed the imaging tests through PACS system  I reviewed available ER/hospitalization records thought the EMR       Fayrene Helper, [place] 07/31/12 1552

## 2012-09-12 DIAGNOSIS — D369 Benign neoplasm, unspecified site: Secondary | ICD-10-CM

## 2012-09-12 HISTORY — DX: Benign neoplasm, unspecified site: D36.9

## 2012-09-30 ENCOUNTER — Emergency Department (HOSPITAL_COMMUNITY)
Admission: EM | Admit: 2012-09-30 | Discharge: 2012-09-30 | Disposition: A | Discharge status: Home / Self Care | Payer: Medicare Other | Attending: Emergency Medicine | Admitting: Emergency Medicine

## 2012-09-30 ENCOUNTER — Encounter (HOSPITAL_COMMUNITY): Payer: Self-pay

## 2012-09-30 ENCOUNTER — Emergency Department (HOSPITAL_COMMUNITY): Payer: Medicare Other

## 2012-09-30 DIAGNOSIS — Y9389 Activity, other specified: Secondary | ICD-10-CM | POA: Insufficient documentation

## 2012-09-30 DIAGNOSIS — I2510 Atherosclerotic heart disease of native coronary artery without angina pectoris: Secondary | ICD-10-CM | POA: Insufficient documentation

## 2012-09-30 DIAGNOSIS — R0602 Shortness of breath: Secondary | ICD-10-CM | POA: Insufficient documentation

## 2012-09-30 DIAGNOSIS — Z8719 Personal history of other diseases of the digestive system: Secondary | ICD-10-CM | POA: Insufficient documentation

## 2012-09-30 DIAGNOSIS — S8990XA Unspecified injury of unspecified lower leg, initial encounter: Secondary | ICD-10-CM | POA: Insufficient documentation

## 2012-09-30 DIAGNOSIS — IMO0002 Reserved for concepts with insufficient information to code with codable children: Secondary | ICD-10-CM | POA: Insufficient documentation

## 2012-09-30 DIAGNOSIS — M1710 Unilateral primary osteoarthritis, unspecified knee: Secondary | ICD-10-CM | POA: Insufficient documentation

## 2012-09-30 DIAGNOSIS — K219 Gastro-esophageal reflux disease without esophagitis: Secondary | ICD-10-CM | POA: Insufficient documentation

## 2012-09-30 DIAGNOSIS — S99919A Unspecified injury of unspecified ankle, initial encounter: Secondary | ICD-10-CM | POA: Insufficient documentation

## 2012-09-30 DIAGNOSIS — J449 Chronic obstructive pulmonary disease, unspecified: Secondary | ICD-10-CM

## 2012-09-30 DIAGNOSIS — E785 Hyperlipidemia, unspecified: Secondary | ICD-10-CM | POA: Insufficient documentation

## 2012-09-30 DIAGNOSIS — Z8673 Personal history of transient ischemic attack (TIA), and cerebral infarction without residual deficits: Secondary | ICD-10-CM | POA: Insufficient documentation

## 2012-09-30 DIAGNOSIS — Z8709 Personal history of other diseases of the respiratory system: Secondary | ICD-10-CM | POA: Insufficient documentation

## 2012-09-30 DIAGNOSIS — Z7901 Long term (current) use of anticoagulants: Secondary | ICD-10-CM | POA: Insufficient documentation

## 2012-09-30 DIAGNOSIS — Z79899 Other long term (current) drug therapy: Secondary | ICD-10-CM | POA: Insufficient documentation

## 2012-09-30 DIAGNOSIS — I10 Essential (primary) hypertension: Secondary | ICD-10-CM | POA: Insufficient documentation

## 2012-09-30 DIAGNOSIS — R6883 Chills (without fever): Secondary | ICD-10-CM | POA: Insufficient documentation

## 2012-09-30 DIAGNOSIS — Z8701 Personal history of pneumonia (recurrent): Secondary | ICD-10-CM | POA: Insufficient documentation

## 2012-09-30 DIAGNOSIS — M2550 Pain in unspecified joint: Secondary | ICD-10-CM | POA: Insufficient documentation

## 2012-09-30 DIAGNOSIS — Z8679 Personal history of other diseases of the circulatory system: Secondary | ICD-10-CM | POA: Insufficient documentation

## 2012-09-30 DIAGNOSIS — J40 Bronchitis, not specified as acute or chronic: Secondary | ICD-10-CM | POA: Insufficient documentation

## 2012-09-30 DIAGNOSIS — Y929 Unspecified place or not applicable: Secondary | ICD-10-CM | POA: Insufficient documentation

## 2012-09-30 DIAGNOSIS — J45909 Unspecified asthma, uncomplicated: Secondary | ICD-10-CM | POA: Insufficient documentation

## 2012-09-30 DIAGNOSIS — R296 Repeated falls: Secondary | ICD-10-CM | POA: Insufficient documentation

## 2012-09-30 DIAGNOSIS — J4489 Other specified chronic obstructive pulmonary disease: Secondary | ICD-10-CM | POA: Insufficient documentation

## 2012-09-30 DIAGNOSIS — Z87891 Personal history of nicotine dependence: Secondary | ICD-10-CM | POA: Insufficient documentation

## 2012-09-30 DIAGNOSIS — Z872 Personal history of diseases of the skin and subcutaneous tissue: Secondary | ICD-10-CM | POA: Insufficient documentation

## 2012-09-30 DIAGNOSIS — F411 Generalized anxiety disorder: Secondary | ICD-10-CM | POA: Insufficient documentation

## 2012-09-30 LAB — CBC WITH DIFFERENTIAL/PLATELET
Eosinophils Absolute: 0.3 10*3/uL (ref 0.0–0.7)
HCT: 42.4 % (ref 39.0–52.0)
Hemoglobin: 14.3 g/dL (ref 13.0–17.0)
Lymphs Abs: 2.1 10*3/uL (ref 0.7–4.0)
MCH: 29.2 pg (ref 26.0–34.0)
MCV: 86.7 fL (ref 78.0–100.0)
Monocytes Absolute: 0.5 10*3/uL (ref 0.1–1.0)
Monocytes Relative: 6 % (ref 3–12)
Neutrophils Relative %: 61 % (ref 43–77)
RBC: 4.89 MIL/uL (ref 4.22–5.81)

## 2012-09-30 LAB — BASIC METABOLIC PANEL
BUN: 10 mg/dL (ref 6–23)
Creatinine, Ser: 0.87 mg/dL (ref 0.50–1.35)
GFR calc non Af Amer: 82 mL/min — ABNORMAL LOW (ref >90)
Glucose, Bld: 86 mg/dL (ref 70–99)
Potassium: 3.8 mEq/L (ref 3.5–5.1)

## 2012-09-30 MED ORDER — METHYLPREDNISOLONE SODIUM SUCC 125 MG IJ SOLR
125.0000 mg | Freq: Once | INTRAMUSCULAR | Status: AC
  Administered 2012-09-30: 125 mg via INTRAVENOUS
  Filled 2012-09-30: qty 2

## 2012-09-30 MED ORDER — ALBUTEROL SULFATE (5 MG/ML) 0.5% IN NEBU
2.5000 mg | INHALATION_SOLUTION | RESPIRATORY_TRACT | Status: DC
  Administered 2012-09-30: 2.5 mg via RESPIRATORY_TRACT
  Filled 2012-09-30: qty 0.5

## 2012-09-30 MED ORDER — METHYLPREDNISOLONE SODIUM SUCC 125 MG IJ SOLR
INTRAMUSCULAR | Status: AC
  Filled 2012-09-30: qty 2

## 2012-09-30 MED ORDER — IPRATROPIUM BROMIDE 0.02 % IN SOLN
0.5000 mg | RESPIRATORY_TRACT | Status: DC
  Administered 2012-09-30: 0.5 mg via RESPIRATORY_TRACT
  Filled 2012-09-30: qty 2.5

## 2012-09-30 MED ORDER — PREDNISONE 20 MG PO TABS: ORAL_TABLET | ORAL | Status: DC

## 2012-09-30 NOTE — ED Provider Notes (Signed)
History     CSN: [id number]  Arrival date & time 09/30/12  1207   First MD Initiated Contact with Patient 09/30/12 1541      Chief Complaint  Patient presents with  . Cough  . Fall  . Knee Pain    (Consider location/radiation/quality/duration/timing/severity/associated sxs/prior treatment) HPI Comments: Patient brought to the ER for evaluation of cough. Patient reportedly has had progressively worsening cough over a period of about 2 months. He has not had a fever but has had chills. Cough is productive of thick sputum. He saw his doctor 2 weeks ago when was given hydrocodone cough syrup and antibiotic, but did not improve.  Patient also complaining of bilateral knee pain. He reports that his knees click, pop lock up on him. Sometimes when he stands up his knees give out when he falls to the floor.  Patient is a 78 y.o. [name] presenting with cough, fall, and knee pain.  Cough Associated symptoms include chills and shortness of breath.  Fall  Knee Pain Associated symptoms include shortness of breath.    Past Medical History  Diagnosis Date  . Atrial fibrillation     coumadin therapy;  echo 1/10: EF 65-70%, mild diast dysfxn  . COPD (chronic obstructive pulmonary disease)     admitted 5/11  . Hyperlipidemia   . GERD (gastroesophageal reflux disease)   . Allergic rhinitis   . Asthma   . Anxiety   . Hiatal hernia   . CAD (coronary artery disease)     cath 7/09: pCFX 25%, mRCA 25%, EF 60%;  Myoview 6/11: low risk, EF 67%  . History of pneumonia   . Sebaceous cyst     scalp  . Neck pain   . SVT (supraventricular tachycardia)     s/p RFCA 05/2010  . Emphysema   . First degree AV block   . History of TIAs   . Hypertension     Past Surgical History  Procedure Date  . Hernia repair     RIH  . Cardiac surgery     a fib surgery  . Knee surgery 1981  . Cystectomy 2012    removed from head  . Back surgery 1980  . Cardiac valve surgery     Family History  Problem  Relation Age of Onset  . Hypertension Mother   . COPD Father   . COPD Sister   . Cancer Brother     lung    History  Substance Use Topics  . Smoking status: Former Smoker -- 3.0 packs/day for 40 years    Types: Cigarettes    Quit date: 09/13/1995  . Smokeless tobacco: Not on file  . Alcohol Use: No      Review of Systems  Constitutional: Positive for chills.  Respiratory: Positive for cough and shortness of breath.   Cardiovascular: Negative.   Musculoskeletal: Positive for arthralgias. Negative for joint swelling.  All other systems reviewed and are negative.    Allergies  Review of patient's allergies indicates no known allergies.  Home Medications   Current Outpatient Rx  Name  Route  Sig  Dispense  Refill  . ALBUTEROL SULFATE (2.5 MG/3ML) 0.083% IN NEBU   Nebulization   Take 2.5 mg by nebulization every 4 (four) hours as needed. For shortness of breath         . ALPRAZOLAM 0.5 MG PO TABS   Oral   Take 0.5 mg by mouth 2 (two) times daily.          Marland Kitchen  DILTIAZEM HCL ER COATED BEADS 120 MG PO CP24   Oral   Take 1 capsule (120 mg total) by mouth daily.   30 capsule   5     90 day supply is acceptable   . MOMETASONE FURO-FORMOTEROL FUM 200-5 MCG/ACT IN AERO   Inhalation   Inhale 2 puffs into the lungs 2 (two) times daily.         Marland Kitchen MONTELUKAST SODIUM 10 MG PO TABS   Oral   Take 10 mg by mouth daily.         Marland Kitchen OMEPRAZOLE 20 MG PO CPDR   Oral   Take 40 mg by mouth daily.         Marland Kitchen POTASSIUM CHLORIDE ER 10 MEQ PO TBCR   Oral   Take 1 tablet (10 mEq total) by mouth 2 (two) times daily.   30 tablet   6   . WARFARIN SODIUM 2 MG PO TABS   Oral   Take 2-4 mg by mouth every evening. Schedule unknown.           BP 142/74  Pulse 90  Temp 98.8 F (37.1 C)  Resp 20  SpO2 99%  Physical Exam  Constitutional: He is oriented to person, place, and time. He appears well-developed and well-nourished. No distress.  HENT:  Head: Normocephalic  and atraumatic.  Right Ear: Hearing normal.  Nose: Nose normal.  Mouth/Throat: Oropharynx is clear and moist and mucous membranes are normal.  Eyes: Conjunctivae normal and EOM are normal. Pupils are equal, round, and reactive to light.  Neck: Normal range of motion. Neck supple.  Cardiovascular: Normal rate, regular rhythm, S1 normal and S2 normal.  Exam reveals no gallop and no friction rub.   No murmur heard. Pulmonary/Chest: Accessory muscle usage present. Tachypnea noted. No respiratory distress. He has decreased breath sounds in the left upper field and the left lower field. He has wheezes in the right middle field, the right lower field, the left middle field and the left lower field. He has rhonchi in the right lower field and the left lower field. He exhibits no tenderness.  Abdominal: Soft. Normal appearance and bowel sounds are normal. There is no hepatosplenomegaly. There is no tenderness. There is no rebound, no guarding, no tenderness at McBurney's point and negative Murphy's sign. No hernia.  Musculoskeletal: Normal range of motion.  Neurological: He is alert and oriented to person, place, and time. He has normal strength. No cranial nerve deficit or sensory deficit. Coordination normal. GCS eye subscore is 4. GCS verbal subscore is 5. GCS motor subscore is 6.  Skin: Skin is warm, dry and intact. No rash noted. No cyanosis.  Psychiatric: He has a normal mood and affect. His speech is normal and behavior is normal. Thought content normal.    ED Course  Procedures (including critical care time)   Date: 09/30/2012  Rate: 70  Rhythm: normal sinus rhythm  QRS Axis: normal  Intervals: PR prolonged  ST/T Wave abnormalities: normal  Conduction Disutrbances:none  Narrative Interpretation:   Old EKG Reviewed: unchanged    Labs Reviewed  BASIC METABOLIC PANEL - Abnormal; Notable for the following:    GFR calc non Af Amer 82 (*)     All other components within normal limits  CBC  WITH DIFFERENTIAL  PRO B NATRIURETIC PEPTIDE  TROPONIN I   Dg Chest 2 View  09/30/2012  *RADIOLOGY REPORT*  Clinical Data: Cough and shortness of breath.  CHEST - 2 VIEW  Comparison: 09/10/2012.  Findings: The cardiac silhouette, mediastinal and hilar contours are within normal limits and stable.  The lungs are clear acute process.  Bilateral nipple shadows are noted along with a stable left lung calcified granuloma.  No pleural effusion.  The bony thorax is intact.  IMPRESSION: No acute cardiopulmonary findings.   Original Report Authenticated By: Rudie Meyer, M.D.    Dg Knee 1-2 Views Left  09/30/2012  *RADIOLOGY REPORT*  Clinical Data: Left knee pain.  LEFT KNEE - 1-2 VIEW  Comparison: None  Findings: Tricompartmental degenerative changes are noted with joint space narrowing and osteophytic spurring.  No acute fracture or osteochondral lesion.  A small joint effusion is noted. Vascular calcifications are noted.  IMPRESSION: Tricompartmental degenerative changes but no acute bony findings. Small joint effusion.   Original Report Authenticated By: Rudie Meyer, M.D.    Dg Knee 1-2 Views Right  09/30/2012  *RADIOLOGY REPORT*  Clinical Data: Right knee pain.  RIGHT KNEE - 1-2 VIEW  Comparison: None  Findings: Advanced tricompartmental degenerative changes with joint space narrowing and osteophytic spurring.  No acute fracture or osteochondral abnormality no definite joint effusion.  Vascular calcifications are noted.  IMPRESSION: Advanced tricompartmental degenerative changes but no acute bony findings.   Original Report Authenticated By: Rudie Meyer, M.D.      Diagnosis: 1. COPD 2. Bronchitis 3. Osteoarthritis bilateral knees    MDM  Patient presents to the ER for evaluation of progressively worsening cough. Patient reports that he has been sick for almost 2 months. He was on antibiotics 2 weeks ago without improvement. Lung examination reveals decreased breath sounds bilaterally with wheezing  throughout. Is improved with the nebulizer treatment. Chest x-ray did not show any evidence of pneumonia. Patient's oxygen saturation is 90-92% on room air, but patient uses 2.5 L nasal cannula at home. He appears improved and does not require hospitalization. Patient will be discharged with prednisone and continue bronchodilator therapy.  Patient also complaining of bilateral knee pain. X-rays of the knee shows severe tricompartmental degenerative changes.  Patient is to followup with his primary doctor in the office this week for a recheck of his breathing as well as further consideration of treatment of his severe osteoarthritis in the knees.        Gilda Crease, MD 09/30/12 [phone]

## 2012-09-30 NOTE — ED Notes (Addendum)
Patient and patient's daughter report that the patient has a frequent hacking cough, bilateral knee pain which causes him to fall. Patient also c/o dizziness.

## 2012-11-26 ENCOUNTER — Encounter (HOSPITAL_BASED_OUTPATIENT_CLINIC_OR_DEPARTMENT_OTHER): Payer: Self-pay | Admitting: *Deleted

## 2012-11-26 ENCOUNTER — Emergency Department (HOSPITAL_BASED_OUTPATIENT_CLINIC_OR_DEPARTMENT_OTHER): Payer: Medicare Other

## 2012-11-26 ENCOUNTER — Emergency Department (HOSPITAL_BASED_OUTPATIENT_CLINIC_OR_DEPARTMENT_OTHER)
Admission: EM | Admit: 2012-11-26 | Discharge: 2012-11-26 | Disposition: A | Discharge status: Home / Self Care | Payer: Medicare Other | Attending: Emergency Medicine | Admitting: Emergency Medicine

## 2012-11-26 DIAGNOSIS — M25569 Pain in unspecified knee: Secondary | ICD-10-CM | POA: Insufficient documentation

## 2012-11-26 DIAGNOSIS — K219 Gastro-esophageal reflux disease without esophagitis: Secondary | ICD-10-CM | POA: Insufficient documentation

## 2012-11-26 DIAGNOSIS — Z8719 Personal history of other diseases of the digestive system: Secondary | ICD-10-CM | POA: Insufficient documentation

## 2012-11-26 DIAGNOSIS — F411 Generalized anxiety disorder: Secondary | ICD-10-CM | POA: Insufficient documentation

## 2012-11-26 DIAGNOSIS — I2510 Atherosclerotic heart disease of native coronary artery without angina pectoris: Secondary | ICD-10-CM | POA: Insufficient documentation

## 2012-11-26 DIAGNOSIS — Z87891 Personal history of nicotine dependence: Secondary | ICD-10-CM | POA: Insufficient documentation

## 2012-11-26 DIAGNOSIS — Z8673 Personal history of transient ischemic attack (TIA), and cerebral infarction without residual deficits: Secondary | ICD-10-CM | POA: Insufficient documentation

## 2012-11-26 DIAGNOSIS — J441 Chronic obstructive pulmonary disease with (acute) exacerbation: Secondary | ICD-10-CM | POA: Insufficient documentation

## 2012-11-26 DIAGNOSIS — J45901 Unspecified asthma with (acute) exacerbation: Secondary | ICD-10-CM | POA: Insufficient documentation

## 2012-11-26 DIAGNOSIS — Z8679 Personal history of other diseases of the circulatory system: Secondary | ICD-10-CM | POA: Insufficient documentation

## 2012-11-26 DIAGNOSIS — Z7901 Long term (current) use of anticoagulants: Secondary | ICD-10-CM | POA: Insufficient documentation

## 2012-11-26 DIAGNOSIS — Z872 Personal history of diseases of the skin and subcutaneous tissue: Secondary | ICD-10-CM | POA: Insufficient documentation

## 2012-11-26 DIAGNOSIS — Z8701 Personal history of pneumonia (recurrent): Secondary | ICD-10-CM | POA: Insufficient documentation

## 2012-11-26 DIAGNOSIS — Z79899 Other long term (current) drug therapy: Secondary | ICD-10-CM | POA: Insufficient documentation

## 2012-11-26 LAB — PROTIME-INR: Prothrombin Time: 30.9 seconds — ABNORMAL HIGH (ref 11.6–15.2)

## 2012-11-26 LAB — BASIC METABOLIC PANEL
BUN: 12 mg/dL (ref 6–23)
CO2: 27 mEq/L (ref 19–32)
Calcium: 9.2 mg/dL (ref 8.4–10.5)
Creatinine, Ser: 1 mg/dL (ref 0.50–1.35)
GFR calc non Af Amer: 71 mL/min — ABNORMAL LOW (ref >90)
Glucose, Bld: 113 mg/dL — ABNORMAL HIGH (ref 70–99)
Sodium: 143 mEq/L (ref 135–145)

## 2012-11-26 LAB — CBC
HCT: 41.7 % (ref 39.0–52.0)
Hemoglobin: 14.1 g/dL (ref 13.0–17.0)
MCH: 29 pg (ref 26.0–34.0)
MCHC: 33.8 g/dL (ref 30.0–36.0)
MCV: 85.6 fL (ref 78.0–100.0)
RBC: 4.87 MIL/uL (ref 4.22–5.81)

## 2012-11-26 MED ORDER — PREDNISONE 20 MG PO TABS: ORAL_TABLET | ORAL | Status: DC

## 2012-11-26 MED ORDER — PREDNISONE 50 MG PO TABS: 60.0000 mg | ORAL_TABLET | Freq: Once | ORAL | Status: DC

## 2012-11-26 MED ORDER — DOXYCYCLINE HYCLATE 100 MG PO CAPS: 100.0000 mg | ORAL_CAPSULE | Freq: Two times a day (BID) | ORAL | Status: DC

## 2012-11-26 MED ORDER — BENZONATATE 100 MG PO CAPS: 100.0000 mg | ORAL_CAPSULE | Freq: Three times a day (TID) | ORAL | Status: DC

## 2012-11-26 NOTE — ED Provider Notes (Signed)
History     CSN: [id number]  Arrival date & time 11/26/12  0018   First MD Initiated Contact with Patient 11/26/12 0110      Chief Complaint  Patient presents with  . Cough  . Knee Pain    (Consider location/radiation/quality/duration/timing/severity/associated sxs/prior treatment) Patient is a 78 y.o. male presenting with cough and knee pain. The history is provided by the patient.  Cough Cough characteristics:  Dry Severity:  Moderate Onset quality:  Gradual Duration:  3 days Timing:  Intermittent Progression:  Unchanged Chronicity:  Recurrent Smoker: former.   Context: not sick contacts   Relieved by:  Nothing Associated symptoms: wheezing   Associated symptoms: no chest pain, no fever and no shortness of breath   Knee Pain Associated symptoms: no fever     Past Medical History  Diagnosis Date  . Atrial fibrillation     coumadin therapy;  echo 1/10: EF 65-70%, mild diast dysfxn  . COPD (chronic obstructive pulmonary disease)     admitted 5/11  . Hyperlipidemia   . GERD (gastroesophageal reflux disease)   . Allergic rhinitis   . Asthma   . Anxiety   . Hiatal hernia   . CAD (coronary artery disease)     cath 7/09: pCFX 25%, mRCA 25%, EF 60%;  Myoview 6/11: low risk, EF 67%  . History of pneumonia   . Sebaceous cyst     scalp  . Neck pain   . SVT (supraventricular tachycardia)     s/p RFCA 05/2010  . Emphysema   . First degree AV block   . History of TIAs   . Hypertension     Past Surgical History  Procedure Laterality Date  . Hernia repair      RIH  . Cardiac surgery      a fib surgery  . Knee surgery  1981  . Cystectomy  2012    removed from head  . Back surgery  1980  . Cardiac valve surgery      Family History  Problem Relation Age of Onset  . Hypertension Mother   . COPD Father   . COPD Sister   . Cancer Brother     lung    History  Substance Use Topics  . Smoking status: Former Smoker -- 3.00 packs/day for 40 years    Types:  Cigarettes    Quit date: 09/13/1995  . Smokeless tobacco: Not on file  . Alcohol Use: No      Review of Systems  Constitutional: Negative for fever.  Respiratory: Positive for cough and wheezing. Negative for shortness of breath.   Cardiovascular: Negative for chest pain, palpitations and leg swelling.  All other systems reviewed and are negative.    Allergies  Review of patient's allergies indicates no known allergies.  Home Medications   Current Outpatient Rx  Name  Route  Sig  Dispense  Refill  . albuterol (PROVENTIL) (2.5 MG/3ML) 0.083% nebulizer solution   Nebulization   Take 2.5 mg by nebulization every 4 (four) hours as needed. For shortness of breath         . ALPRAZolam (XANAX) 0.5 MG tablet   Oral   Take 0.5 mg by mouth 2 (two) times daily.          Marland Kitchen diltiazem (CARDIZEM CD) 120 MG 24 hr capsule   Oral   Take 1 capsule (120 mg total) by mouth daily.   30 capsule   5     90 day  supply is acceptable   . mometasone-formoterol (DULERA) 200-5 MCG/ACT AERO   Inhalation   Inhale 2 puffs into the lungs 2 (two) times daily.         . montelukast (SINGULAIR) 10 MG tablet   Oral   Take 10 mg by mouth daily.         Marland Kitchen omeprazole (PRILOSEC) 20 MG capsule   Oral   Take 40 mg by mouth daily.         . potassium chloride (K-DUR) 10 MEQ tablet   Oral   Take 1 tablet (10 mEq total) by mouth 2 (two) times daily.   30 tablet   6   . warfarin (COUMADIN) 2 MG tablet   Oral   Take 2-4 mg by mouth every evening. Schedule unknown.         . predniSONE (DELTASONE) 20 MG tablet      3 tabs po daily x 3 days, then 2 tabs x 3 days, then 1.5 tabs x 3 days, then 1 tab x 3 days, then 0.5 tabs x 3 days   27 tablet   0     BP 144/74  Pulse 92  Temp(Src) 98 F (36.7 C) (Oral)  Resp 24  SpO2 95%  Physical Exam  Constitutional: He is oriented to person, place, and time. He appears well-developed and well-nourished. No distress.  HENT:  Head:  Normocephalic and atraumatic.  Mouth/Throat: Oropharynx is clear and moist.  Eyes: Conjunctivae are normal. Pupils are equal, round, and reactive to light.  Neck: Normal range of motion. Neck supple. No tracheal deviation present.  Cardiovascular: Normal rate, regular rhythm and intact distal pulses.   Pulmonary/Chest: Effort normal and breath sounds normal. No stridor. No respiratory distress. He has no wheezes. He has no rales.  Abdominal: Soft. Bowel sounds are normal. There is no tenderness. There is no rebound and no guarding.  Musculoskeletal: Normal range of motion. He exhibits no edema.  Negative anterior and posterior drawer tests of B knees.  FROM of the left ankle.    Neurological: He is alert and oriented to person, place, and time. He has normal reflexes.  Skin: Skin is warm and dry.  Psychiatric: He has a normal mood and affect.    ED Course  Procedures (including critical care time)  Labs Reviewed  CBC  BASIC METABOLIC PANEL  TROPONIN I   No results found.   No diagnosis found.    MDM   Date: 11/26/2012  Rate: 81  Rhythm: normal sinus rhythm  QRS Axis: normal  Intervals: first degree AVB  ST/T Wave abnormalities: normal  Conduction Disutrbances: first degree avb  Narrative Interpretation: first degree avb    Will treat for COPD exacerbation.  With > 8 hours of ongoing symptoms ACS is ruled out       Austin Pongratz Smitty Cords, MD 11/26/12 [phone]

## 2012-11-26 NOTE — ED Notes (Signed)
Pt returned from xray

## 2012-11-26 NOTE — ED Notes (Signed)
Pt with cough x 1 week- also states he fell wed and hurt his left knee

## 2012-11-26 NOTE — ED Notes (Signed)
MD at bedside. 

## 2012-11-27 ENCOUNTER — Ambulatory Visit (INDEPENDENT_AMBULATORY_CARE_PROVIDER_SITE_OTHER): Payer: Medicare Other | Admitting: Physician Assistant

## 2012-11-27 DIAGNOSIS — M25561 Pain in right knee: Secondary | ICD-10-CM

## 2012-11-27 DIAGNOSIS — M25569 Pain in unspecified knee: Secondary | ICD-10-CM

## 2012-11-27 DIAGNOSIS — I4891 Unspecified atrial fibrillation: Secondary | ICD-10-CM

## 2012-11-27 NOTE — Progress Notes (Signed)
Subjective:     Indication: atrial fibrillation Bleeding signs/symptoms: None Thromboembolic signs/symptoms: None  Missed Coumadin doses: This week - 2 doses Medication changes: no Dietary changes: no Bacterial/viral infection: yes Other concerns: no   Review of Systems Pertinent items are noted in HPI.   Objective:    INR Today: 2.0 Current dose: 4mg  qd except 2mg  Tues and Thurs     Assessment:    Therapeutic INR for goal of 2-3   Plan:    1. New dose: no change   2. Next INR: 2 weeks

## 2012-11-28 ENCOUNTER — Other Ambulatory Visit: Payer: Self-pay | Admitting: Pharmacist

## 2012-11-28 DIAGNOSIS — I4891 Unspecified atrial fibrillation: Secondary | ICD-10-CM

## 2012-12-11 ENCOUNTER — Other Ambulatory Visit: Payer: Self-pay

## 2012-12-11 ENCOUNTER — Telehealth: Payer: Self-pay | Admitting: Physician Assistant

## 2012-12-11 MED ORDER — OMEPRAZOLE 20 MG PO CPDR: 40.0000 mg | DELAYED_RELEASE_CAPSULE | Freq: Every day | ORAL | Status: DC

## 2012-12-11 MED ORDER — TIOTROPIUM BROMIDE MONOHYDRATE 18 MCG IN CAPS: 18.0000 ug | ORAL_CAPSULE | Freq: Every day | RESPIRATORY_TRACT | Status: DC

## 2012-12-11 MED ORDER — WARFARIN SODIUM 2 MG PO TABS: 2.0000 mg | ORAL_TABLET | Freq: Every evening | ORAL | Status: DC

## 2012-12-11 MED ORDER — ALPRAZOLAM 0.5 MG PO TABS: 0.5000 mg | ORAL_TABLET | Freq: Three times a day (TID) | ORAL | Status: DC | PRN

## 2012-12-11 NOTE — Telephone Encounter (Signed)
Patient needs refills on  of his medicine pharmacy has been calling here for the past two days and no response.

## 2012-12-11 NOTE — Telephone Encounter (Signed)
Cant tell from chart when last filled  Last seen 09/10/12

## 2012-12-11 NOTE — Telephone Encounter (Signed)
Warfarin and omeprazole refilled to [facility]

## 2012-12-13 ENCOUNTER — Telehealth: Payer: Self-pay | Admitting: Physician Assistant

## 2012-12-13 NOTE — Telephone Encounter (Signed)
Pt requesting refill on Xanax. Pt can be reached at home [phone].

## 2012-12-14 ENCOUNTER — Telehealth: Payer: Self-pay | Admitting: Physician Assistant

## 2012-12-14 ENCOUNTER — Other Ambulatory Visit: Payer: Self-pay | Admitting: Physician Assistant

## 2012-12-14 DIAGNOSIS — F411 Generalized anxiety disorder: Secondary | ICD-10-CM

## 2012-12-14 MED ORDER — ALPRAZOLAM 0.5 MG PO TABS: 0.5000 mg | ORAL_TABLET | Freq: Three times a day (TID) | ORAL | Status: DC | PRN

## 2012-12-14 NOTE — Telephone Encounter (Signed)
Ready to be phoned in (Xanax)

## 2012-12-14 NOTE — Telephone Encounter (Signed)
Xanax 0.5 TID QTY 90 called to [facility]. Family aware other meds were sent electronically.

## 2012-12-17 ENCOUNTER — Encounter: Payer: Self-pay | Admitting: Nurse Practitioner

## 2012-12-17 ENCOUNTER — Ambulatory Visit (INDEPENDENT_AMBULATORY_CARE_PROVIDER_SITE_OTHER): Payer: Medicare Other | Admitting: Nurse Practitioner

## 2012-12-17 VITALS — BP 161/75 | HR 61 | Temp 96.7°F | Ht 71.5 in | Wt 183.0 lb

## 2012-12-17 DIAGNOSIS — I4891 Unspecified atrial fibrillation: Secondary | ICD-10-CM

## 2012-12-17 LAB — POCT INR: INR: 3.6

## 2012-12-17 NOTE — Patient Instructions (Addendum)
Anticoagulation Dose Instructions as of 12/17/2012     Glynis Smiles Tue Wed Thu Fri Sat   New Dose 4 mg Hold 2 mg 4 mg 2 mg 4 mg 4 mg

## 2012-12-17 NOTE — Progress Notes (Signed)
  Subjective:    Patient ID: [name], male    DOB: Aug 06, 1935, 78 y.o.   MRN: [id number]  HPI    Review of Systems     Objective:   Physical Exam        Assessment & Plan:  Patent here for anticoag recheck only- See anticoag flow sheet

## 2012-12-21 ENCOUNTER — Telehealth: Payer: Self-pay | Admitting: Family Medicine

## 2012-12-21 NOTE — Telephone Encounter (Signed)
APPT 9:40 ON  MONDAY

## 2012-12-24 ENCOUNTER — Ambulatory Visit (INDEPENDENT_AMBULATORY_CARE_PROVIDER_SITE_OTHER): Payer: Medicare Other | Admitting: Family Medicine

## 2012-12-24 ENCOUNTER — Encounter: Payer: Self-pay | Admitting: Family Medicine

## 2012-12-24 ENCOUNTER — Ambulatory Visit (INDEPENDENT_AMBULATORY_CARE_PROVIDER_SITE_OTHER): Payer: Medicare Other | Admitting: Pharmacist

## 2012-12-24 VITALS — BP 172/80 | HR 75 | Temp 97.0°F | Wt 183.6 lb

## 2012-12-24 DIAGNOSIS — K219 Gastro-esophageal reflux disease without esophagitis: Secondary | ICD-10-CM

## 2012-12-24 DIAGNOSIS — J4489 Other specified chronic obstructive pulmonary disease: Secondary | ICD-10-CM

## 2012-12-24 DIAGNOSIS — I4891 Unspecified atrial fibrillation: Secondary | ICD-10-CM

## 2012-12-24 DIAGNOSIS — J449 Chronic obstructive pulmonary disease, unspecified: Secondary | ICD-10-CM

## 2012-12-24 DIAGNOSIS — R7309 Other abnormal glucose: Secondary | ICD-10-CM

## 2012-12-24 DIAGNOSIS — G459 Transient cerebral ischemic attack, unspecified: Secondary | ICD-10-CM

## 2012-12-24 DIAGNOSIS — R05 Cough: Secondary | ICD-10-CM

## 2012-12-24 DIAGNOSIS — R059 Cough, unspecified: Secondary | ICD-10-CM

## 2012-12-24 DIAGNOSIS — E785 Hyperlipidemia, unspecified: Secondary | ICD-10-CM

## 2012-12-24 DIAGNOSIS — R739 Hyperglycemia, unspecified: Secondary | ICD-10-CM

## 2012-12-24 LAB — GLUCOSE, POCT (MANUAL RESULT ENTRY): POC Glucose: 66 mg/dl — AB (ref 70–99)

## 2012-12-24 LAB — HEPATIC FUNCTION PANEL
ALT: <8 U/L (ref 0–53)
AST: 15 U/L (ref 0–37)
Albumin: 3.9 g/dL (ref 3.5–5.2)
Alkaline Phosphatase: 88 U/L (ref 39–117)
Bilirubin, Direct: 0.1 mg/dL (ref 0.0–0.3)
Indirect Bilirubin: 0.3 mg/dL (ref 0.0–0.9)
Total Bilirubin: 0.4 mg/dL (ref 0.3–1.2)
Total Protein: 6.4 g/dL (ref 6.0–8.3)

## 2012-12-24 LAB — POCT GLYCOSYLATED HEMOGLOBIN (HGB A1C): Hemoglobin A1C: 5.4

## 2012-12-24 LAB — POCT INR: INR: 4.2

## 2012-12-24 MED ORDER — ROFLUMILAST 500 MCG PO TABS: 500.0000 ug | ORAL_TABLET | Freq: Every day | ORAL | Status: DC

## 2012-12-24 NOTE — Progress Notes (Signed)
Patient ID: [name], male   DOB: 05-15-1935, 78 y.o.   MRN: [id number] SUBJECTIVE: HPI: Duration of exacerbation: has had shortness of breath has had wheezing has had a response to medications Medications used:nebulizer with albuterol, and spiriva. Qvar helped in the past  has not had swelling of the feet has not had chest pain has not had exposure tobacco smoke or other triggers. has had a flu shot , October 2013 has had a pneumonia shot April 2006  has not had fever has had purulent phlegm has not had blood in the sputum, has had weight loss has not had any recent medication changes. No blood in the stool. Had a colonoscopy October 2006   PMH/PSH: reviewed/updated in Epic  SH/FH: reviewed/updated in Epic  Allergies: reviewed/updated in Epic  Medications: reviewed/updated in Epic  Immunizations: reviewed/updated in Epic  ROS: As above in the HPI. All other systems are stable or negative.  OBJECTIVE: APPEARANCE:  Patient in no acute distress.The patient appeared well nourished and normally developed. Acyanotic. Waist: VITAL SIGNS:BP 172/80  Pulse 75  Temp(Src) 97 F (36.1 C) (Oral)  Wt 183 lb 9.6 oz (83.28 kg)  BMI 25.25 kg/m2   SKIN: warm and  Dry without overt rashes, tattoos and scars  HEAD and Neck: without JVD, Head and scalp: normal Eyes:No scleral icterus. Fundi normal, eye movements normal. Ears: Auricle normal, canal normal, Tympanic membranes normal, insufflation normal. Nose: normal Throat: normal Neck & thyroid: normal  CHEST & LUNGS: Chest wall: normal Lungs: He has scattered rhonchi, prolonged expiratory phase of breathing, a rare wheeze on expiration. Coarse breath sounds. He was not in distress. Good excursion.  CVS: Reveals the PMI to be normally located. Regular rhythm, First and Second Heart sounds are normal,  absence of murmurs, rubs or gallops. Peripheral vasculature: Radial pulses: normal Dorsal pedis pulses:  Decreased Posterior pulses: Decreased  ABDOMEN:  Appearance: normal Benign,, no organomegaly, no masses, no Abdominal Aortic enlargement. No Guarding , no rebound. No Bruits. Bowel sounds: normal  RECTAL: N/A GU: N/A  EXTREMETIES: nonedematous. Both Femoral and Pedal pulses are normal.  MUSCULOSKELETAL:  Spine: normal Joints: Arthritis of the knees and ankles  NEUROLOGIC: oriented to time,place and person; nonfocal. Strength is normal Sensory is normal Reflexes are normal Cranial Nerves are normal.  ASSESSMENT: A-fib  COPD  Cough  HYPERLIPIDEMIA - Plan: Hepatic function panel, NMR Lipoprofile with Lipids  TIA  GERD  Hyperglycemia - Plan: POCT glycosylated hemoglobin (Hb A1C), POCT glucose (manual entry)  His COPD and still characterized by bouts of coughing. He had done well with some additional steroids. I would add DALIRESP. And see him back to see if this improves. His other medical problems, been stable. Hyperglycemia was found on his labs and his recent emergency room visits no prednisone. Will evaluate this Today with a glucose and hemoglobin A1c. Other problems stable.  PLAN:  Orders Placed This Encounter  Procedures  . Hepatic function panel  . NMR Lipoprofile with Lipids  . POCT glycosylated hemoglobin (Hb A1C)  . POCT glucose (manual entry)   No results found for this or any previous visit (from the past 24 hour(s)). Meds ordered this encounter  Medications  . roflumilast (DALIRESP) 500 MCG TABS tablet    Sig: Take 1 tablet (500 mcg total) by mouth daily.    Dispense:  30 tablet    Refill:  3   gave him 4 week samples of Daliresp  Return to the clinic in 2 weeks for  the Coumadin clinic. Return to the clinic in 6 weeks for per Dr. Leodis Sias Reviewed and discussed his medications. Reviewed his past history. Reviewed his ED visit. Review his labs done recently. Appropriate labs ordered today.   Crystie Yanko P. [place] Charon, M.D.

## 2012-12-24 NOTE — Patient Instructions (Signed)
Anticoagulation Dose Instructions as of 12/24/2012     [name] Tue Wed Thu Fri Sat   New Dose 2 mg 4 mg 2 mg 2 mg 2 mg 4 mg 2 mg    Description       No warfarin today - Monday, April 14th, then start new dose of 2mg  daily except on Mondays and Fridays take 4mg .

## 2012-12-24 NOTE — Progress Notes (Signed)
Quick Note:  Call patient. Labs normal. No change in plan. Rest pending ______

## 2012-12-26 LAB — NMR LIPOPROFILE WITH LIPIDS
Cholesterol, Total: 197 mg/dL (ref <200)
HDL Particle Number: 27.2 umol/L — ABNORMAL LOW (ref >=30.5)
HDL Size: 8.8 nm — ABNORMAL LOW (ref >=9.2)
HDL-C: 44 mg/dL (ref >=40)
LDL (calc): 131 mg/dL — ABNORMAL HIGH (ref <100)
LDL Particle Number: 1499 nmol/L — ABNORMAL HIGH (ref <1000)
LDL Size: 21.1 nm (ref >20.5)
LP-IR Score: 54 — ABNORMAL HIGH (ref <=45)
Large HDL-P: 3.6 umol/L — ABNORMAL LOW (ref >=4.8)
Large VLDL-P: 3.4 nmol/L — ABNORMAL HIGH (ref <=2.7)
Small LDL Particle Number: 333 nmol/L (ref <=527)
Triglycerides: 111 mg/dL (ref <150)
VLDL Size: 46.4 nm (ref <=46.6)

## 2012-12-28 NOTE — Progress Notes (Signed)
Quick Note:  Labs abnormal.LDLparticles and LDLc are high. He has an appointment for the coumadin clinic in 2weeks. Could we please have an additional appointment to address the elevated LDLc in view of him also being on coumadin. Thanks. ______

## 2012-12-31 ENCOUNTER — Telehealth: Payer: Self-pay

## 2012-12-31 ENCOUNTER — Ambulatory Visit: Payer: Medicare Other | Admitting: Family Medicine

## 2012-12-31 NOTE — Telephone Encounter (Signed)
Pt aware. Has appt for 01-01-13 with dr [place] Charon

## 2012-12-31 NOTE — Telephone Encounter (Signed)
Message copied by Pura Spice on Mon Dec 31, 2012  9:09 AM ------      Message from: Ileana Ladd      Created: Fri Dec 28, 2012  2:52 PM       Labs abnormal.LDLparticles and LDLc are high.      He has an appointment for the coumadin clinic in  2weeks.      Could we please have an additional appointment to address the elevated LDLc in view of him also being on coumadin.      Thanks. ------

## 2013-01-01 ENCOUNTER — Telehealth: Payer: Self-pay | Admitting: Gastroenterology

## 2013-01-01 ENCOUNTER — Ambulatory Visit (INDEPENDENT_AMBULATORY_CARE_PROVIDER_SITE_OTHER): Payer: Medicare Other

## 2013-01-01 ENCOUNTER — Ambulatory Visit (INDEPENDENT_AMBULATORY_CARE_PROVIDER_SITE_OTHER): Payer: Medicare Other | Admitting: Family Medicine

## 2013-01-01 ENCOUNTER — Encounter: Payer: Self-pay | Admitting: Family Medicine

## 2013-01-01 ENCOUNTER — Other Ambulatory Visit: Payer: Self-pay | Admitting: Family Medicine

## 2013-01-01 VITALS — BP 121/76 | HR 106 | Temp 97.7°F | Wt 179.0 lb

## 2013-01-01 DIAGNOSIS — R6881 Early satiety: Secondary | ICD-10-CM

## 2013-01-01 DIAGNOSIS — R05 Cough: Secondary | ICD-10-CM

## 2013-01-01 DIAGNOSIS — R634 Abnormal weight loss: Secondary | ICD-10-CM

## 2013-01-01 DIAGNOSIS — R1013 Epigastric pain: Secondary | ICD-10-CM

## 2013-01-01 DIAGNOSIS — R059 Cough, unspecified: Secondary | ICD-10-CM

## 2013-01-01 DIAGNOSIS — Z1212 Encounter for screening for malignant neoplasm of rectum: Secondary | ICD-10-CM

## 2013-01-01 LAB — COMPLETE METABOLIC PANEL WITH GFR
ALT: <8 U/L (ref 0–53)
AST: 15 U/L (ref 0–37)
Albumin: 4 g/dL (ref 3.5–5.2)
Alkaline Phosphatase: 80 U/L (ref 39–117)
BUN: 12 mg/dL (ref 6–23)
CO2: 32 mEq/L (ref 19–32)
Calcium: 9.9 mg/dL (ref 8.4–10.5)
Chloride: 99 mEq/L (ref 96–112)
Creat: 1.01 mg/dL (ref 0.50–1.35)
GFR, Est African American: 84 mL/min
GFR, Est Non African American: 72 mL/min
Glucose, Bld: 93 mg/dL (ref 70–99)
Potassium: 3.8 mEq/L (ref 3.5–5.3)
Sodium: 138 mEq/L (ref 135–145)
Total Bilirubin: 0.8 mg/dL (ref 0.3–1.2)
Total Protein: 6.3 g/dL (ref 6.0–8.3)

## 2013-01-01 LAB — POCT CBC
Granulocyte percent: 59.3 %G (ref 37–80)
HCT, POC: 44.7 % (ref 43.5–53.7)
Hemoglobin: 15 g/dL (ref 14.1–18.1)
Lymph, poc: 2.9 (ref 0.6–3.4)
MCH, POC: 28.7 pg (ref 27–31.2)
MCHC: 33.7 g/dL (ref 31.8–35.4)
MCV: 85.4 fL (ref 80–97)
MPV: 9.3 fL (ref 0–99.8)
POC Granulocyte: 5 (ref 2–6.9)
POC LYMPH PERCENT: 34.5 %L (ref 10–50)
Platelet Count, POC: 247 10*3/uL (ref 142–424)
RBC: 5.2 M/uL (ref 4.69–6.13)
RDW, POC: 13.5 %
WBC: 8.5 10*3/uL (ref 4.6–10.2)

## 2013-01-01 LAB — TSH: TSH: 0.102 u[IU]/mL — ABNORMAL LOW (ref 0.350–4.500)

## 2013-01-01 LAB — AMYLASE: Amylase: 41 U/L (ref 0–105)

## 2013-01-01 LAB — LIPASE: Lipase: 17 U/L (ref 0–75)

## 2013-01-01 NOTE — Progress Notes (Signed)
Patient ID: [name], male   DOB: June 25, 1935, 78 y.o.   MRN: [id number] SUBJECTIVE: HPI: Cough Patient complains of productive cough. Symptoms began several week ago. Symptoms have been unchanged since that time.The cough is harsh, paroxysmal and productive and is aggravated by nothing. Associated symptoms include: sputum production. Patient does not have new pets. Patient does not have a history of asthma. Patient does not have a history of environmental allergens. Patient has not traveled recently. Patient does have a history of smoking. Patient has had a previous chest x-ray. Patient has not had a PPD done. Weight loss early satiety, and sometimes vomits up his meals. Poor appetite.weight loss progressive since last summer, approximately 35 or more lbs.  PMH/PSH: reviewed/updated in Epic  SH/FH: reviewed/updated in Epic  Allergies: reviewed/updated in Epic  Medications: reviewed/updated in Epic  Immunizations: reviewed/updated in Epic  ROS: As above in the HPI. All other systems are stable or negative.  OBJECTIVE: APPEARANCE: weight loss obvious Patient in no acute distress.The patient appeared well nourished and normally developed. Acyanotic. Waist: VITAL SIGNS:BP 121/76  Pulse 106  Temp(Src) 97.7 F (36.5 C) (Oral)  Wt 179 lb (81.194 kg)  BMI 24.62 kg/m2   SKIN: warm and  Dry without overt rashes, tattoos and scars  HEAD and Neck: without JVD, Head and scalp: normal Eyes:No scleral icterus. Fundi normal, eye movements normal. Ears: Auricle normal, canal normal, Tympanic membranes normal, insufflation normal. Nose: normal Throat: normal ,poor dentition Neck & thyroid: normal  CHEST & LUNGS: Chest wall: normal Lungs:coarse breath sounds with rhonchi. CVS: Reveals the PMI to be normally located. Regular rhythm, First and Second Heart sounds are normal,  absence of murmurs, rubs or gallops. Peripheral vasculature: Radial pulses: normal Dorsal pedis pulses:  normal Posterior pulses: normal  ABDOMEN:  Appearance: normal Benign,, no organomegaly, no masses, no Abdominal Aortic enlargement. No Guarding , no rebound. No Bruits. Bowel sounds: normal  RECTAL: N/A GU: N/A  EXTREMETIES: nonedematous. Both Femoral and Pedal pulses are normal.  MUSCULOSKELETAL:  Spine: normal Joints: intact  NEUROLOGIC: oriented to time,place and person; nonfocal. Strength is normal  ASSESSMENT: Loss of weight - Plan: DG Abd 1 View, POCT CBC, COMPLETE METABOLIC PANEL WITH GFR, TSH, Lipase, Ambulatory referral to Gastroenterology, CT Abdomen Pelvis W Contrast, CANCELED: Fecal occult blood, imunochemical, CANCELED: POCT urinalysis dipstick  Abdominal pain, epigastric - Plan: DG Abd 1 View, Lipase, Amylase, Ambulatory referral to Gastroenterology, CT Abdomen Pelvis W Contrast, CANCELED: POCT urinalysis dipstick  Early satiety - Plan: DG Abd 1 View, Ambulatory referral to Gastroenterology, CT Abdomen Pelvis W Contrast, CANCELED: POCT urinalysis dipstick  Cough - Plan: DG Chest 2 View  Screening for malignant neoplasm of the rectum - Plan: Fecal occult blood, imunochemical  Patient was brought in to discuss his hyperlipidemia, but his highly suspicious symptoms of weight loss takes priority over the hyperlipidemia.  PLAN: Orders Placed This Encounter  Procedures  . Fecal occult blood, imunochemical    Standing Status: Future     Number of Occurrences:      Standing Expiration Date: 01/01/2014  . DG Chest 2 View    Standing Status: Future     Number of Occurrences: 1     Standing Expiration Date: 03/03/2014    Order Specific Question:  Reason for Exam (SYMPTOM  OR DIAGNOSIS REQUIRED)    Answer:  weight loss    Order Specific Question:  Reason for Exam (SYMPTOM  OR DIAGNOSIS REQUIRED)    Answer:  cough  Order Specific Question:  Reason for Exam (SYMPTOM  OR DIAGNOSIS REQUIRED)    Answer:  exsmoker    Order Specific Question:  Preferred imaging  location?    Answer:  Internal  . DG Abd 1 View    Standing Status: Future     Number of Occurrences: 1     Standing Expiration Date: 03/03/2014    Order Specific Question:  Reason for Exam (SYMPTOM  OR DIAGNOSIS REQUIRED)    Answer:  abdominal pain    Order Specific Question:  Reason for Exam (SYMPTOM  OR DIAGNOSIS REQUIRED)    Answer:  weght loss    Order Specific Question:  Preferred imaging location?    Answer:  Internal  . CT Abdomen Pelvis W Contrast    Standing Status: Future     Number of Occurrences:      Standing Expiration Date: 04/02/2014    Order Specific Question:  Reason for Exam (SYMPTOM  OR DIAGNOSIS REQUIRED)    Answer:  abdominal pain epigastric    Order Specific Question:  Reason for Exam (SYMPTOM  OR DIAGNOSIS REQUIRED)    Answer:  sudden weight loss    Order Specific Question:  Reason for Exam (SYMPTOM  OR DIAGNOSIS REQUIRED)    Answer:  early satiety    Order Specific Question:  Preferred imaging location?    Answer:  [facility]  . COMPLETE METABOLIC PANEL WITH GFR  . TSH  . Lipase  . Amylase  . Ambulatory referral to Gastroenterology    Referral Priority:  Routine    Referral Type:  Consultation    Referral Reason:  Specialty Services Required    Requested Specialty:  Gastroenterology    Number of Visits Requested:  1  . POCT CBC   Results for orders placed in visit on 01/01/13 (from the past 24 hour(s))  POCT CBC     Status: None   Collection Time    01/01/13 11:03 AM      Result Value Range   WBC 8.5  4.6 - 10.2 K/uL   Lymph, poc 2.9  0.6 - 3.4   POC LYMPH PERCENT 34.5  10 - 50 %L   POC Granulocyte 5.0  2 - 6.9   Granulocyte percent 59.3  37 - 80 %G   RBC 5.2  4.69 - 6.13 M/uL   Hemoglobin 15.0  14.1 - 18.1 g/dL   HCT, POC 96.2  95.2 - 53.7 %   MCV 85.4  80 - 97 fL   MCH, POC 28.7  27 - 31.2 pg   MCHC 33.7  31.8 - 35.4 g/dL   RDW, POC 84.1     Platelet Count, POC 247.0  142 - 424 K/uL   MPV 9.3  0 - 99.8 fL   Discussed with  daughter the work up. [facility] reading (PRIMARY) by  Dr. Leodis Sias.: CXray: chronic  Changes, nodule left mid chest. COPD. Await official reading.    No orders of the defined types were placed in this encounter.   RTC in 2 weeks to ensure that work up progressing in the right direction.  Add ensure or Boost to his  Diet.  Kobi Aller P. Modesto Charon, M.D.

## 2013-01-01 NOTE — Progress Notes (Signed)
Quick Note:  Xray abnormal. May have a cyst or mass in a kidney. Will need the CT of the abdomen and pelvis as planned. Being worked up for weight loss. ______

## 2013-01-01 NOTE — Progress Notes (Signed)
Quick Note:  Labs abnormal. TSH is low; I suspect that he has hyperthyroidism and this is causing the weight loss and contributes to atrial fibrillation. We need to have him redraw labs for a full thyroid panel in the next day or two. And referral to endocrinology. He should still get the abdominal CT.  ______

## 2013-01-01 NOTE — Telephone Encounter (Signed)
Left message for [name] to call back

## 2013-01-02 LAB — THYROID PROFILE - CHCC
Free Thyroxine Index: 3.8 (ref 1.0–3.9)
T3 Uptake: 37.4 % — ABNORMAL HIGH (ref 22.5–37.0)
T4, Total: 10.2 ug/dL (ref 5.0–12.5)

## 2013-01-02 NOTE — Telephone Encounter (Signed)
Patient scheduled for CT abd and pelvis for tomorrow am at [facility].  I have scheduled him an appt with Willette Cluster RNP 01/03/13 1:30

## 2013-01-03 ENCOUNTER — Ambulatory Visit (HOSPITAL_COMMUNITY)
Admission: RE | Admit: 2013-01-03 | Discharge: 2013-01-03 | Disposition: A | Discharge status: Home / Self Care | Payer: Medicare Other | Source: Ambulatory Visit | Attending: Family Medicine | Admitting: Family Medicine

## 2013-01-03 ENCOUNTER — Ambulatory Visit (INDEPENDENT_AMBULATORY_CARE_PROVIDER_SITE_OTHER): Payer: Medicare Other | Admitting: Pharmacist

## 2013-01-03 ENCOUNTER — Ambulatory Visit (INDEPENDENT_AMBULATORY_CARE_PROVIDER_SITE_OTHER): Payer: Medicare Other | Admitting: Nurse Practitioner

## 2013-01-03 ENCOUNTER — Encounter: Payer: Self-pay | Admitting: Nurse Practitioner

## 2013-01-03 VITALS — BP 108/66 | HR 100 | Ht 70.5 in | Wt 180.5 lb

## 2013-01-03 DIAGNOSIS — K5900 Constipation, unspecified: Secondary | ICD-10-CM | POA: Insufficient documentation

## 2013-01-03 DIAGNOSIS — K5732 Diverticulitis of large intestine without perforation or abscess without bleeding: Secondary | ICD-10-CM

## 2013-01-03 DIAGNOSIS — D689 Coagulation defect, unspecified: Secondary | ICD-10-CM

## 2013-01-03 DIAGNOSIS — R109 Unspecified abdominal pain: Secondary | ICD-10-CM | POA: Insufficient documentation

## 2013-01-03 DIAGNOSIS — R6881 Early satiety: Secondary | ICD-10-CM

## 2013-01-03 DIAGNOSIS — N289 Disorder of kidney and ureter, unspecified: Secondary | ICD-10-CM | POA: Insufficient documentation

## 2013-01-03 DIAGNOSIS — I4891 Unspecified atrial fibrillation: Secondary | ICD-10-CM

## 2013-01-03 DIAGNOSIS — R634 Abnormal weight loss: Secondary | ICD-10-CM

## 2013-01-03 DIAGNOSIS — K4090 Unilateral inguinal hernia, without obstruction or gangrene, not specified as recurrent: Secondary | ICD-10-CM | POA: Insufficient documentation

## 2013-01-03 DIAGNOSIS — R1013 Epigastric pain: Secondary | ICD-10-CM

## 2013-01-03 DIAGNOSIS — R933 Abnormal findings on diagnostic imaging of other parts of digestive tract: Secondary | ICD-10-CM | POA: Insufficient documentation

## 2013-01-03 LAB — POCT INR: INR: 2.2

## 2013-01-03 MED ORDER — IOHEXOL 300 MG/ML  SOLN
100.0000 mL | Freq: Once | INTRAMUSCULAR | Status: AC | PRN
  Administered 2013-01-03: 100 mL via INTRAVENOUS

## 2013-01-03 MED ORDER — METRONIDAZOLE 250 MG PO TABS: 250.0000 mg | ORAL_TABLET | Freq: Three times a day (TID) | ORAL | Status: DC

## 2013-01-03 MED ORDER — CIPROFLOXACIN HCL 250 MG PO TABS: 250.0000 mg | ORAL_TABLET | Freq: Two times a day (BID) | ORAL | Status: DC

## 2013-01-03 NOTE — Patient Instructions (Addendum)
We have given you a free sample of the colonoscopy prep you will be drinking. You have been scheduled for a colonoscopy with propofol. Please follow written instructions given to you at your visit today.  Please pick up your prep kit at the pharmacy within the next 1-3 days. If you use inhalers (even only as needed), please bring them with you on the day of your procedure. Your physician has requested that you go to www.startemmi.com and enter the access code given to you at your visit today. This web site gives a general overview about your procedure. However, you should still follow specific instructions given to you by our office regarding your preparation for the procedure.    [facility]- we sent Cipro and Flagyl to the pharamcy. Low fiber until after you finish the antibiotics. High fiber after finishing the antibiotics.

## 2013-01-03 NOTE — Patient Instructions (Signed)
Anticoagulation Dose Instructions as of 01/03/2013     [name] Tue Wed Thu Fri Sat   New Dose 2 mg 4 mg 2 mg 2 mg 2 mg 4 mg 2 mg    Description       Continue same dose of 2mg  [= 1 tablet] daily except on Mondays and Fridays take 4mg  [ = 2 tablets]     INR was 2.2 today

## 2013-01-03 NOTE — Progress Notes (Addendum)
HPI :  Patient is a 78 year old male with multiple medical problems, on multiple medications including coumadin. He is new to this practice, referred here for weight loss and abdominal pain. Per PCP's office note, patient has lost 35 pounds since last summer. He isn't nauseated, just has a poor appetite. His gives a two week history of LLQ pain started a couple of weeks ago. He has been constipated over the last several days for unclear reasons. No blood in stool. CBC, CMET and Lipase by PCP this week was normal. CT scan of the abdomen and pelvis with contrast for evaluation of weight loss shows mild sigmoid diverticulitis  He reports a colonoscopy greater than 10 years ago.   Past Medical History  Diagnosis Date  . Atrial fibrillation     coumadin therapy;  echo 1/10: EF 65-70%, mild diast dysfxn  . COPD (chronic obstructive pulmonary disease)     admitted 5/11  . Hyperlipidemia   . GERD (gastroesophageal reflux disease)   . Allergic rhinitis   . Asthma   . Anxiety   . Hiatal hernia   . CAD (coronary artery disease)     cath 7/09: pCFX 25%, mRCA 25%, EF 60%;  Myoview 6/11: low risk, EF 67%  . History of pneumonia   . Sebaceous cyst     scalp  .    Marland Kitchen SVT (supraventricular tachycardia)     s/p RFCA 05/2010  . Emphysema   . First degree AV block   . History of TIAs   . Hypertension     Family History  Problem Relation Age of Onset  . Hypertension Mother   . COPD Father   . COPD Sister   . Cancer Brother     lung  . Lung cancer Mother    History  Substance Use Topics  . Smoking status: Former Smoker -- 3.00 packs/day for 40 years    Types: Cigarettes    Quit date: 09/13/1995  . Smokeless tobacco: Never Used  . Alcohol Use: No   Current Outpatient Prescriptions  Medication Sig Dispense Refill  . albuterol (PROVENTIL) (2.5 MG/3ML) 0.083% nebulizer solution Take 2.5 mg by nebulization every 4 (four) hours as needed. For shortness of breath      . ALPRAZolam (XANAX) 0.5  MG tablet Take 1 tablet (0.5 mg total) by mouth 3 (three) times daily as needed for sleep.  90 tablet  0  . AMBULATORY NON FORMULARY MEDICATION O2 @ night @ 3LPM      . benzonatate (TESSALON) 100 MG capsule Take 1 capsule (100 mg total) by mouth every 8 (eight) hours.  21 capsule  0  . diltiazem (CARDIZEM CD) 120 MG 24 hr capsule Take 1 capsule (120 mg total) by mouth daily.  30 capsule  5  . doxycycline (VIBRAMYCIN) 100 MG capsule Take 1 capsule (100 mg total) by mouth 2 (two) times daily. One po bid x 7 days  14 capsule  0  . mometasone-formoterol (DULERA) 200-5 MCG/ACT AERO Inhale 2 puffs into the lungs 2 (two) times daily.      . montelukast (SINGULAIR) 10 MG tablet Take 10 mg by mouth daily.      Marland Kitchen omeprazole (PRILOSEC) 20 MG capsule Take 2 capsules (40 mg total) by mouth daily.  60 capsule  5  . predniSONE (DELTASONE) 20 MG tablet 3 tabs po daily x 3 days, then 2 tabs x 3 days, then 1.5 tabs x 3 days, then 1 tab x 3 days, then 0.5  tabs x 3 days  27 tablet  0  . predniSONE (DELTASONE) 20 MG tablet 3 tabs po day one, then 2 po daily x 4 days  11 tablet  0  . roflumilast (DALIRESP) 500 MCG TABS tablet Take 1 tablet (500 mcg total) by mouth daily.  30 tablet  3  . tiotropium (SPIRIVA HANDIHALER) 18 MCG inhalation capsule Place 1 capsule (18 mcg total) into inhaler and inhale daily.  30 capsule  3  . warfarin (COUMADIN) 2 MG tablet Take 1-2 tablets (2-4 mg total) by mouth every evening. Schedule unknown.  60 tablet  2  . ciprofloxacin (CIPRO) 250 MG tablet Take 1 tablet (250 mg total) by mouth 2 (two) times daily.  20 tablet  0  . HYDROcodone-acetaminophen (NORCO/VICODIN) 5-325 MG per tablet Take 1 tablet by mouth every 8 (eight) hours as needed for pain.  30 tablet  0  . metroNIDAZOLE (FLAGYL) 250 MG tablet Take 1 tablet (250 mg total) by mouth 3 (three) times daily.  30 tablet  0  . [DISCONTINUED] albuterol-ipratropium (COMBIVENT) 18-103 MCG/ACT inhaler Inhale 1 puff into the lungs every 6 (six)  hours as needed. For wheezing      . [DISCONTINUED] atorvastatin (LIPITOR) 20 MG tablet Take 20 mg by mouth daily.        . [DISCONTINUED] clonazePAM (KLONOPIN) 0.5 MG tablet Take 0.5 mg by mouth at bedtime as needed. For sleep       . [DISCONTINUED] furosemide (LASIX) 20 MG tablet Take 20 mg by mouth daily.         No current facility-administered medications for this visit.   No Known Allergies   Review of Systems: Positive for allergy, anxiety, back pain, vision changes, confusion, cough, fatigue, headaches, heart rhythm changes, muscle pains, shortness of breath. All other systems reviewed and negative except where noted in HPI.   Physical Exam: BP 108/66  Pulse 100  Ht 5' 10.5" (1.791 m)  Wt 180 lb 8 oz (81.874 kg)  BMI 25.52 kg/m2 Constitutional: White male in no acute distress. HEENT: Normocephalic and atraumatic. Conjunctivae are normal. No scleral icterus. Neck supple.  Cardiovascular: Normal rate, regular rhythm.  Pulmonary/chest: Effort normal . Decreased breath sounds at both bases. . Abdominal: Soft, nondistended, nontender. Bowel sounds active throughout. There are no masses palpable. No hepatomegaly. Extremities: no edema Lymphadenopathy: No cervical adenopathy noted. Neurological: Alert and oriented to person place and time. Skin: Skin is warm and dry. No rashes noted. Psychiatric: Normal mood and affect. Behavior is normal.   ASSESSMENT AND PLAN:  52. 78 year old male with multiple medical problems, on multiple medications including coumadin.   2. LLQ discomfort, probable diverticulitis (uncomplicated) by CTscan.    Will treat with 10 days of antibiotics.   He needs a colonoscopy in a few weeks to exclude underlying neoplasm, especially given bowel changes and weight loss. Dr. Modesto Charon will be contacted about whether coumadin can be held for procedures.   Will check INR in about a week since it can be affected by antibiotics.   Patient will return for brief  recheck prior to colonoscopy to make sure diverticulitis has resolved and that he is still stable for procedures.   3.Moderate left inguinal hernia containing a single loop of sigmoid colon  4. Recent bowel change, constipation. Begin daily MiraLax as needed. Further evaluation at time of colonoscopy  5. Weight loss, Appetite poor. No nausea but describes early satiety. No malignancies on CTscan, Will schedule EGD at time of  colonoscopy.  6. Enlarged prostate by CAT scan, Evaluation per PCP.  7. Low TSH, If hyperthyroid then this could account for some of his weight loss. To see Endocrinology soon  8. Chronic anticoagulation  Addendum: Reviewed and agree with initial management. Will allow for treatment of diverticulitis before procedure, but then plan colonoscopy. Upper endoscopy is reasonable to evaluate weight loss and early satiety should the colonoscopy be unremarkable. Due to medical comorbidities this will need to be done in the hospital setting. We will look for a time to perform this procedure Beverley Fiedler, MD

## 2013-01-03 NOTE — Addendum Note (Signed)
Addended by: Orma Render F on: 01/03/2013 01:42 PM   Modules accepted: Orders

## 2013-01-04 ENCOUNTER — Encounter: Payer: Self-pay | Admitting: Nurse Practitioner

## 2013-01-04 ENCOUNTER — Other Ambulatory Visit: Payer: Self-pay | Admitting: Family Medicine

## 2013-01-04 ENCOUNTER — Telehealth: Payer: Self-pay | Admitting: Physician Assistant

## 2013-01-04 ENCOUNTER — Other Ambulatory Visit: Payer: Medicare Other

## 2013-01-04 DIAGNOSIS — I4891 Unspecified atrial fibrillation: Secondary | ICD-10-CM

## 2013-01-04 LAB — TSH: TSH: 0.114 u[IU]/mL — ABNORMAL LOW (ref 0.350–4.500)

## 2013-01-04 MED ORDER — HYDROCODONE-ACETAMINOPHEN 5-325 MG PO TABS: 1.0000 | ORAL_TABLET | Freq: Three times a day (TID) | ORAL | Status: DC | PRN

## 2013-01-04 NOTE — Telephone Encounter (Signed)
NEEDS SOMETHING FOR PAIN! 

## 2013-01-04 NOTE — Progress Notes (Signed)
Quick Note:  Call patient. Labs normal. No change in plan. ______ 

## 2013-01-04 NOTE — Telephone Encounter (Signed)
Lance Bosch aware of pts request

## 2013-01-06 ENCOUNTER — Other Ambulatory Visit: Payer: Self-pay | Admitting: Family Medicine

## 2013-01-06 DIAGNOSIS — IMO0002 Reserved for concepts with insufficient information to code with codable children: Secondary | ICD-10-CM

## 2013-01-06 DIAGNOSIS — N2889 Other specified disorders of kidney and ureter: Secondary | ICD-10-CM

## 2013-01-06 DIAGNOSIS — R634 Abnormal weight loss: Secondary | ICD-10-CM

## 2013-01-06 NOTE — Progress Notes (Signed)
Quick Note:  Labs abnormal. TSH low , he may have hyperthyroidism overactive thyroid? Will refer to Endocrine. Also CT abdomen shows a mass in the left Kidney. Needs to see Urology. Will expedite referral. Referrals done in Epic.  ______

## 2013-01-07 ENCOUNTER — Ambulatory Visit (INDEPENDENT_AMBULATORY_CARE_PROVIDER_SITE_OTHER): Payer: Medicare Other | Admitting: Pharmacist

## 2013-01-07 ENCOUNTER — Telehealth: Payer: Self-pay | Admitting: Pharmacist

## 2013-01-07 ENCOUNTER — Telehealth: Payer: Self-pay | Admitting: *Deleted

## 2013-01-07 ENCOUNTER — Other Ambulatory Visit: Payer: Self-pay | Admitting: *Deleted

## 2013-01-07 DIAGNOSIS — R109 Unspecified abdominal pain: Secondary | ICD-10-CM

## 2013-01-07 DIAGNOSIS — I4891 Unspecified atrial fibrillation: Secondary | ICD-10-CM

## 2013-01-07 DIAGNOSIS — K5732 Diverticulitis of large intestine without perforation or abscess without bleeding: Secondary | ICD-10-CM

## 2013-01-07 DIAGNOSIS — R630 Anorexia: Secondary | ICD-10-CM

## 2013-01-07 DIAGNOSIS — R634 Abnormal weight loss: Secondary | ICD-10-CM

## 2013-01-07 NOTE — Telephone Encounter (Signed)
Called Debbie and explained the situation and that pt will not have his procedure until 02/07/13. Pt was also scheduled to see a PA this week and we will delay that until May when the schedules come out. Problem is we received a staff message from Henrene Pastor, PharmD stating pt thinks his COLON appt is 01/09/13. Tammy had pt stop the Coumadin. I will send this note to Tammy and Dr Modesto Charon and I have asked Eunice Blase to have pt call me. Debbie called back and they will go to pt's house this pm and have him restart his coumadin and contact Tammy or Dr Modesto Charon tomorrow.

## 2013-01-07 NOTE — Telephone Encounter (Signed)
I called [phone] and left message for [name]. I advised her to please call me.  I told her we will have to cancel the office visit for 01-09-2013 and the colonoscopy scheduled for 01-18-2013.  We are working on a later date at the end of May with Dr. Erick Blinks. We feel he needs some time to heal from the diverticulitis.  I asked her to please call me.

## 2013-01-08 ENCOUNTER — Telehealth: Payer: Self-pay | Admitting: *Deleted

## 2013-01-08 ENCOUNTER — Telehealth: Payer: Self-pay | Admitting: Family Medicine

## 2013-01-08 ENCOUNTER — Telehealth: Payer: Self-pay | Admitting: Internal Medicine

## 2013-01-08 NOTE — Telephone Encounter (Signed)
Spoke with pt's daughter Harden Mo and apologized for having to r/s pt, but he needs both procedures done and d/t health conditions, he needs them done at the Hospital. We will see him 5-10 days before 02/07/13 and he will receive instructions then; he will see Dr Modesto Charon and Henrene Pastor, PHARMD prior to the procedure as well. Mamie stated understanding.

## 2013-01-08 NOTE — Telephone Encounter (Signed)
Message copied by Florene Glen on Tue Jan 08, 2013 11:40 AM ------      Message from: Audie Pinto, [place] K      Created: Tue Jan 08, 2013 11:01 AM      Regarding: Pt confused about date                   Yojan, Paskett - 01/08/2013 8:31 AM More Detail >>                     Richardo Hanks, CMA                     Sent: Tue January 08, 2013  9:58 AM           To: Derry Skill, CMA                   Message            ----- Message from Richardo Hanks, CMA sent at 01/08/2013  9:58 AM -----            Please see phone note. Pt has question regarding procedure at [facility]                  Reason for Call         Confused about Procedure Date                              Type Contact Phone         01/08/2013  8:31 AM Phone (Incoming) [name], Cody A (Self) [phone] (H)          Confused about his procedure. His paperwork says May 9th and they got an automated message last night confirming appt on the 9th. But he thought it was on the 30th. I see 29th on the computer. Can we send new paperwork in the mail w/correct date please?                ------

## 2013-01-08 NOTE — Telephone Encounter (Signed)
Forwarded to the clinical pharmacists for review.

## 2013-01-08 NOTE — Telephone Encounter (Signed)
Message copied by Derry Skill on Tue Jan 08, 2013 12:01 PM ------      Message from: Henrene Pastor      Created: Mon Jan 07, 2013 10:22 AM      Regarding: Warfarin instructions       Armenta Erskin,       Mr. [name] came to the anticoagulation clinic at [facility] today.   He had already started to hold warfarin in anticipation of colonoscopy on Wednesday, April 30th.        His INR was 1.5 today.  We instructed him to continue to hold warfarin.        He was told that depending on specifics of his colonoscopy (removeal of polyps, etc), the GI would recommend when it would be safe to restart warfarin and to call to make protim appt in our clinic       1 week from date he restarts warfarin.        Please let me know if you have any questions or concerns.              ------

## 2013-01-08 NOTE — Telephone Encounter (Signed)
Sent message to Graciella Freer RN, Dr. Joylene Grapes nurse.  She and Dr. Rhea Belton have scheduled the colonoscopy for 02-07-2013 at [facility].

## 2013-01-08 NOTE — Telephone Encounter (Signed)
Thank you Tammy for following up!

## 2013-01-08 NOTE — Telephone Encounter (Signed)
Called and spoke with patient and his daughter about procedure date that is 5/29 not 4/29. Last INR was 1.5 yesterday (patient had held for 2 days because he thought his procedure would be 4/29). Patient is instructed to restart warfarin at previous dose of 2mg  daily except 4mg  on Mondays and Fridays.  Since he started metronidazole last week he will return to clinic in 1 week to see if warfarin needs to be adjusted.

## 2013-01-08 NOTE — Telephone Encounter (Signed)
Everything has been resolved per pt's daughter, Mamie. Pt is back on Coumadin and has a f/u with Henrene Pastor, PHARMD and Dr Modesto Charon.

## 2013-01-08 NOTE — Telephone Encounter (Signed)
Called patient, daughter, and son and they all do not have voicemail set up with their phones and they did not answer.  I will continue to try and contact them.  12:20   Chari Manning

## 2013-01-09 ENCOUNTER — Other Ambulatory Visit: Payer: Self-pay | Admitting: *Deleted

## 2013-01-09 ENCOUNTER — Ambulatory Visit: Payer: Medicare Other | Admitting: Physician Assistant

## 2013-01-09 MED ORDER — MONTELUKAST SODIUM 10 MG PO TABS: 10.0000 mg | ORAL_TABLET | Freq: Every day | ORAL | Status: DC

## 2013-01-10 NOTE — Telephone Encounter (Signed)
Called daughter back.   She needs info regarding CT scan. Call was forwarded to [facility] who is covering referrals today

## 2013-01-10 NOTE — Telephone Encounter (Signed)
Patient's daughter called back.

## 2013-01-11 ENCOUNTER — Other Ambulatory Visit (HOSPITAL_COMMUNITY): Payer: Self-pay | Admitting: Endocrinology

## 2013-01-11 DIAGNOSIS — E0590 Thyrotoxicosis, unspecified without thyrotoxic crisis or storm: Secondary | ICD-10-CM

## 2013-01-15 ENCOUNTER — Ambulatory Visit (INDEPENDENT_AMBULATORY_CARE_PROVIDER_SITE_OTHER): Payer: Medicare Other | Admitting: Pharmacist Clinician (PhC)/ Clinical Pharmacy Specialist

## 2013-01-15 DIAGNOSIS — I4891 Unspecified atrial fibrillation: Secondary | ICD-10-CM

## 2013-01-15 LAB — POCT INR: INR: 2

## 2013-01-18 ENCOUNTER — Other Ambulatory Visit: Payer: Medicare Other | Admitting: Internal Medicine

## 2013-01-21 MED ORDER — HYDROCODONE-ACETAMINOPHEN 5-325 MG PO TABS: 1.0000 | ORAL_TABLET | Freq: Three times a day (TID) | ORAL | Status: DC | PRN

## 2013-01-21 NOTE — Telephone Encounter (Signed)
Pt aware - rx up front 

## 2013-01-21 NOTE — Telephone Encounter (Signed)
Please advise 

## 2013-01-21 NOTE — Telephone Encounter (Signed)
rx ready for pickup 

## 2013-01-24 ENCOUNTER — Ambulatory Visit: Payer: Medicare Other | Admitting: Family Medicine

## 2013-01-28 ENCOUNTER — Telehealth: Payer: Self-pay | Admitting: *Deleted

## 2013-01-28 ENCOUNTER — Encounter: Payer: Self-pay | Admitting: Family Medicine

## 2013-01-28 ENCOUNTER — Ambulatory Visit (INDEPENDENT_AMBULATORY_CARE_PROVIDER_SITE_OTHER): Payer: Medicare Other | Admitting: Family Medicine

## 2013-01-28 VITALS — BP 131/72 | HR 64 | Temp 96.8°F | Ht 71.0 in | Wt 178.0 lb

## 2013-01-28 DIAGNOSIS — R634 Abnormal weight loss: Secondary | ICD-10-CM

## 2013-01-28 DIAGNOSIS — J449 Chronic obstructive pulmonary disease, unspecified: Secondary | ICD-10-CM

## 2013-01-28 DIAGNOSIS — M1990 Unspecified osteoarthritis, unspecified site: Secondary | ICD-10-CM | POA: Insufficient documentation

## 2013-01-28 DIAGNOSIS — G894 Chronic pain syndrome: Secondary | ICD-10-CM

## 2013-01-28 DIAGNOSIS — F411 Generalized anxiety disorder: Secondary | ICD-10-CM

## 2013-01-28 DIAGNOSIS — I4891 Unspecified atrial fibrillation: Secondary | ICD-10-CM

## 2013-01-28 DIAGNOSIS — I440 Atrioventricular block, first degree: Secondary | ICD-10-CM

## 2013-01-28 DIAGNOSIS — E785 Hyperlipidemia, unspecified: Secondary | ICD-10-CM

## 2013-01-28 DIAGNOSIS — E0590 Thyrotoxicosis, unspecified without thyrotoxic crisis or storm: Secondary | ICD-10-CM

## 2013-01-28 DIAGNOSIS — M129 Arthropathy, unspecified: Secondary | ICD-10-CM

## 2013-01-28 DIAGNOSIS — J441 Chronic obstructive pulmonary disease with (acute) exacerbation: Secondary | ICD-10-CM

## 2013-01-28 DIAGNOSIS — J4489 Other specified chronic obstructive pulmonary disease: Secondary | ICD-10-CM

## 2013-01-28 DIAGNOSIS — K219 Gastro-esophageal reflux disease without esophagitis: Secondary | ICD-10-CM

## 2013-01-28 DIAGNOSIS — G459 Transient cerebral ischemic attack, unspecified: Secondary | ICD-10-CM

## 2013-01-28 MED ORDER — ALPRAZOLAM 0.5 MG PO TABS: 0.5000 mg | ORAL_TABLET | Freq: Three times a day (TID) | ORAL | Status: DC | PRN

## 2013-01-28 MED ORDER — HYDROCODONE-ACETAMINOPHEN 5-325 MG PO TABS: 1.0000 | ORAL_TABLET | Freq: Four times a day (QID) | ORAL | Status: DC | PRN

## 2013-01-28 MED ORDER — MONTELUKAST SODIUM 10 MG PO TABS: 10.0000 mg | ORAL_TABLET | Freq: Every day | ORAL | Status: DC

## 2013-01-28 MED ORDER — ALBUTEROL SULFATE (2.5 MG/3ML) 0.083% IN NEBU: 2.5000 mg | INHALATION_SOLUTION | RESPIRATORY_TRACT | Status: DC | PRN

## 2013-01-28 MED ORDER — MOMETASONE FURO-FORMOTEROL FUM 200-5 MCG/ACT IN AERO: 2.0000 | INHALATION_SPRAY | Freq: Two times a day (BID) | RESPIRATORY_TRACT | Status: DC

## 2013-01-28 MED ORDER — ROFLUMILAST 500 MCG PO TABS: 500.0000 ug | ORAL_TABLET | Freq: Every day | ORAL | Status: DC

## 2013-01-28 MED ORDER — TIOTROPIUM BROMIDE MONOHYDRATE 18 MCG IN CAPS: 18.0000 ug | ORAL_CAPSULE | Freq: Every day | RESPIRATORY_TRACT | Status: DC

## 2013-01-28 NOTE — Telephone Encounter (Signed)
Spoke with daughter Harden Mo this am and pt will see Willette Cluster, NP on 02/01/13.

## 2013-01-28 NOTE — Progress Notes (Signed)
Patient ID: [name], male   DOB: 06/17/1935, 78 y.o.   MRN: [id number] SUBJECTIVE: HPI: Was last seen by me about 4 weeks ago. Work up started for weight loss: Ct Scan abdomen.mass in Kidney. Seen at [facility]. Told it was renal Cysts. GI also, evaluated and planned Colonsocopy in11 days. Will stop coumadin 1 week prior. Anticoagulation good so far. This Thursday and Friday he gets Radioactive Iodine to treat his Hyperthyroidism as a main cause of his weight loss.  PMH/PSH: reviewed/updated in Epic  SH/FH: reviewed/updated in Epic  Allergies: reviewed/updated in Epic  Medications: reviewed/updated in Epic  Immunizations: reviewed/updated in Epic  ROS: As above in the HPI. All other systems are stable or negative.  OBJECTIVE: APPEARANCE:  Patient in no acute distress.The patient appeared well nourished and normally developed. Acyanotic. Waist: VITAL SIGNS:  SKIN: warm and  Dry without overt rashes, tattoos and scars  HEAD and Neck: without JVD, Head and scalp: normal Eyes:No scleral icterus. Fundi normal, eye movements normal. Ears: Auricle normal, canal normal, Tympanic membranes normal, insufflation normal. Nose: normal Throat: normal Neck & thyroid: normal  CHEST & LUNGS: Chest wall: normal Lungs: Clear  CVS: Reveals the PMI to be normally located. Regular rhythm, First and Second Heart sounds are normal,  absence of murmurs, rubs or gallops. Peripheral vasculature: Radial pulses: normal Dorsal pedis pulses: normal Posterior pulses: normal  ABDOMEN:  Appearance: normal Benign,, no organomegaly, no masses, no Abdominal Aortic enlargement. No Guarding , no rebound. No Bruits. Bowel sounds: normal  RECTAL: N/A GU: N/A  EXTREMETIES: nonedematous. Both Femoral and Pedal pulses are normal.  MUSCULOSKELETAL:  Spine: normal Joints: intact  NEUROLOGIC: oriented to time,place and person; nonfocal. Strength is normal Sensory is  normal Reflexes are normal Cranial Nerves are normal.  Results for orders placed in visit on 01/15/13  POCT INR      Result Value Range   INR 2.0      ASSESSMENT: Anxiety state, unspecified - Plan: ALPRAZolam (XANAX) 0.5 MG tablet  Hyperthyroidism  Loss of weight  COPD exacerbation - Plan: tiotropium (SPIRIVA HANDIHALER) 18 MCG inhalation capsule, roflumilast (DALIRESP) 500 MCG TABS tablet, montelukast (SINGULAIR) 10 MG tablet, mometasone-formoterol (DULERA) 200-5 MCG/ACT AERO, albuterol (PROVENTIL) (2.5 MG/3ML) 0.083% nebulizer solution  Arthritis - Plan: HYDROcodone-acetaminophen (NORCO/VICODIN) 5-325 MG per tablet  Chronic pain syndrome - Plan: HYDROcodone-acetaminophen (NORCO/VICODIN) 5-325 MG per tablet  TIA  PAROXYSMAL ATRIAL FIBRILLATION  HYPERLIPIDEMIA  GERD  COPD  AV BLOCK, 1ST DEGREE   PLAN:  No orders of the defined types were placed in this encounter.   Meds ordered this encounter  Medications  . tiotropium (SPIRIVA HANDIHALER) 18 MCG inhalation capsule    Sig: Place 1 capsule (18 mcg total) into inhaler and inhale daily.    Dispense:  30 capsule    Refill:  4  . roflumilast (DALIRESP) 500 MCG TABS tablet    Sig: Take 1 tablet (500 mcg total) by mouth daily.    Dispense:  30 tablet    Refill:  4  . montelukast (SINGULAIR) 10 MG tablet    Sig: Take 1 tablet (10 mg total) by mouth daily.    Dispense:  30 tablet    Refill:  4    Needs to be seen before next refill  . mometasone-formoterol (DULERA) 200-5 MCG/ACT AERO    Sig: Inhale 2 puffs into the lungs 2 (two) times daily.    Dispense:  13 g    Refill:  4  . ALPRAZolam (  XANAX) 0.5 MG tablet    Sig: Take 1 tablet (0.5 mg total) by mouth 3 (three) times daily as needed for sleep.    Dispense:  90 tablet    Refill:  0    Order Specific Question:  Supervising Provider    Answer:  [name] [[id number]]  . albuterol (PROVENTIL) (2.5 MG/3ML) 0.083% nebulizer solution    Sig: Take 3 mLs (2.5 mg  total) by nebulization every 4 (four) hours as needed. For shortness of breath    Dispense:  75 mL    Refill:  4  . HYDROcodone-acetaminophen (NORCO/VICODIN) 5-325 MG per tablet    Sig: Take 1 tablet by mouth every 6 (six) hours as needed for pain.    Dispense:  90 tablet    Refill:  1    Order Specific Question:  Supervising Provider    Answer:  [name] [[id number]]    RTc 3 months.  Continue with plans for radiactive iodine treatment for hyperthyroidism. Reviewed work up for patient and daughter.  Bindu Docter P. Modesto Charon, M.D.

## 2013-01-28 NOTE — Telephone Encounter (Signed)
Message copied by Florene Glen on Mon Jan 28, 2013 10:10 AM ------      Message from: Graciella Freer K      Created: Mon Jan 07, 2013  2:42 PM       Make appt with paula or amy 5-10 days prior to ecl at [facility]; put on appt about stopping coumadin ------

## 2013-01-30 ENCOUNTER — Ambulatory Visit: Payer: Medicare Other | Admitting: Nurse Practitioner

## 2013-01-31 ENCOUNTER — Encounter (HOSPITAL_COMMUNITY)
Admission: RE | Admit: 2013-01-31 | Discharge: 2013-01-31 | Disposition: A | Discharge status: Home / Self Care | Payer: Medicare Other | Source: Ambulatory Visit | Attending: Endocrinology | Admitting: Endocrinology

## 2013-01-31 DIAGNOSIS — E0590 Thyrotoxicosis, unspecified without thyrotoxic crisis or storm: Secondary | ICD-10-CM | POA: Insufficient documentation

## 2013-02-01 ENCOUNTER — Encounter (HOSPITAL_COMMUNITY)
Admission: RE | Admit: 2013-02-01 | Discharge: 2013-02-01 | Disposition: A | Discharge status: Home / Self Care | Payer: Medicare Other | Source: Ambulatory Visit | Attending: Endocrinology | Admitting: Endocrinology

## 2013-02-01 ENCOUNTER — Ambulatory Visit (INDEPENDENT_AMBULATORY_CARE_PROVIDER_SITE_OTHER): Payer: Medicare Other | Admitting: Nurse Practitioner

## 2013-02-01 ENCOUNTER — Encounter: Payer: Self-pay | Admitting: Nurse Practitioner

## 2013-02-01 VITALS — BP 120/70 | HR 80 | Ht 71.5 in | Wt 179.1 lb

## 2013-02-01 DIAGNOSIS — R634 Abnormal weight loss: Secondary | ICD-10-CM

## 2013-02-01 DIAGNOSIS — K5732 Diverticulitis of large intestine without perforation or abscess without bleeding: Secondary | ICD-10-CM

## 2013-02-01 MED ORDER — SODIUM IODIDE I 131 CAPSULE
14.2000 | Freq: Once | INTRAVENOUS | Status: AC | PRN
  Administered 2013-01-31: 14.2 via ORAL

## 2013-02-01 MED ORDER — SODIUM PERTECHNETATE TC 99M INJECTION
10.8000 | Freq: Once | INTRAVENOUS | Status: AC | PRN
  Administered 2013-02-01: 11 via INTRAVENOUS

## 2013-02-01 NOTE — Patient Instructions (Addendum)
We have given you a copy of your instructions today. You are aware of your coumadin instructions from Dr Modesto Charon. CC:  Leodis Sias MD

## 2013-02-01 NOTE — Progress Notes (Addendum)
History of Present Illness:   Patient is a 78 year old male with multiple medical problems on multiple medications including Coumadin. I saw the patient late April for evaluation of weight loss, constipation, abdominal pain and CT scan showing mild sigmoid diverticulitis. He completed the antibiotics i prescribed. Abdominal pain has resolved. Bowels movements better though he didn't start the Miralax. No further weight loss, feels okay. Patient is scheduled for a EGD and colonoscopy 02/07/13 for further evaluation.  Patient was asked to come in for a brief recheck prior to procedure. Dr. Modesto Charon has approved holding coumadin starting on 25th.   Current Medications, Allergies, Past Medical History, Past Surgical History, Family History and Social History were reviewed in [employer] record.  Studies:   Ct Abdomen Pelvis W Contrast  01/03/2013   *RADIOLOGY REPORT*  Clinical Data: Abdominal pain, constipation, weight loss  CT ABDOMEN AND PELVIS WITH CONTRAST  Technique:  Multidetector CT imaging of the abdomen and pelvis was performed following the standard protocol during bolus administration of intravenous contrast.  Contrast: OMNIPAQUE IOHEXOL 300 MG/ML  SOLN  Comparison: 09/27/2005  Findings: Lung bases are essentially clear.  Liver is notable for focal fat/altered perfusion along the falciform ligament (series 2/image 22).  Spleen, pancreas, and adrenal glands are within normal limits.  Gallbladder is unremarkable.  No intrahepatic or extrahepatic ductal dilatation.  9 mm lesion along the posterior left upper kidney (series 2/image 23), with de-enhancement on delayed imaging, raising the possibility of a small solid renal neoplasm.  4.7 x 4.2 cm mildly complex/septated right upper pole renal cyst (series 2/image 26), benign (Bosniak II).  6.2 x 6.7 cm simple right lower pole cyst (series 2/image 42).  Additional smaller probable right renal cysts.  Bilateral renal vascular  calcifications without definite nonobstructing renal calculi.  No hydronephrosis.  No evidence of bowel obstruction.  Normal appendix.  Mild pericolonic stranding in the left lower quadrant (series 2/image 62), suggesting mild diverticulitis.  No drainable fluid collection or abscess.  No free air.  Atherosclerotic calcifications of the abdominal aorta and branch vessels.  No abdominopelvic ascites.  No suspicious abdominopelvic lymphadenopathy.  Prostatomegaly, measuring 7.6 cm in transverse dimension, and indenting the base of the bladder.  Bladder is within normal limits.  Moderate left inguinal hernia containing a single loop of sigmoid colon (series 2/image 84).  Mild degenerative changes of the visualized thoracolumbar spine.  IMPRESSION: Mild pericolonic stranding in the left lower quadrant, suggesting mild diverticulitis.  No drainable fluid collection or abscess.  No free air.  9 mm lesion in the posterior left upper kidney which is worrisome for possible solid renal neoplasm.  Follow-up MRI abdomen with/without contrast is suggested for further characterization.  Moderate left inguinal hernia containing a single loop of sigmoid colon.  No evidence of bowel obstruction.  Additional ancillary findings as above.  These results will be called to the ordering clinician or representative by the Radiologist Assistant, and communication documented in the PACS Dashboard.   Original Report Authenticated By: Charline Bills, M.D.   Physical Exam: General: Well developed , white male in no acute distress Head: Normocephalic and atraumatic Eyes:  sclerae anicteric, conjunctiva pink  Ears: Normal auditory acuity Lungs: Clear throughout to auscultation Heart: Regular rate and rhythm Abdomen: Soft, non distended, non-tender. No masses, no hepatomegaly. Normal bowel sounds Musculoskeletal: Symmetrical with no gross deformities  Extremities: No edema  Neurological: Alert oriented x 4, grossly  nonfocal Psychological:  Alert and cooperative. Normal mood and affect  Assessment and Recommendations: 1. Recent weight loss, bowel change and mild diverticulitis by CTscan. Bowel movements normalizing, no further weight loss. His diverticulitis was successfully treated. Patient already scheduled for EGD and colonoscopy for evaluation.  Coumadin to be held prior to procedure. Patient looks / feels okay for endoscopic procedures.   2. Moderate left inguinal hernia containing a single loop of sigmoid colon on CTscan   3. Enlarged prostate by CAT scan, Evaluation per PCP.   4. Low TSH, If hyperthyroid then this could account for some of his weight loss.  He got a thyroid scan today.   Addendum: Reviewed and agree with management. Further recs after EGD/colonoscopy Beverley Fiedler, MD

## 2013-02-07 ENCOUNTER — Encounter (HOSPITAL_COMMUNITY)
Admission: RE | Disposition: A | Discharge status: Home / Self Care | Payer: Self-pay | Source: Ambulatory Visit | Attending: Internal Medicine

## 2013-02-07 ENCOUNTER — Ambulatory Visit (HOSPITAL_COMMUNITY)
Admission: RE | Admit: 2013-02-07 | Discharge: 2013-02-07 | Disposition: A | Discharge status: Home / Self Care | Payer: Medicare Other | Source: Ambulatory Visit | Attending: Internal Medicine | Admitting: Internal Medicine

## 2013-02-07 ENCOUNTER — Encounter (HOSPITAL_COMMUNITY): Payer: Self-pay | Admitting: *Deleted

## 2013-02-07 DIAGNOSIS — D126 Benign neoplasm of colon, unspecified: Secondary | ICD-10-CM

## 2013-02-07 DIAGNOSIS — D128 Benign neoplasm of rectum: Secondary | ICD-10-CM | POA: Insufficient documentation

## 2013-02-07 DIAGNOSIS — K648 Other hemorrhoids: Secondary | ICD-10-CM | POA: Insufficient documentation

## 2013-02-07 DIAGNOSIS — R799 Abnormal finding of blood chemistry, unspecified: Secondary | ICD-10-CM | POA: Insufficient documentation

## 2013-02-07 DIAGNOSIS — R634 Abnormal weight loss: Secondary | ICD-10-CM

## 2013-02-07 DIAGNOSIS — K5730 Diverticulosis of large intestine without perforation or abscess without bleeding: Secondary | ICD-10-CM | POA: Insufficient documentation

## 2013-02-07 DIAGNOSIS — K4090 Unilateral inguinal hernia, without obstruction or gangrene, not specified as recurrent: Secondary | ICD-10-CM | POA: Insufficient documentation

## 2013-02-07 DIAGNOSIS — K5732 Diverticulitis of large intestine without perforation or abscess without bleeding: Secondary | ICD-10-CM

## 2013-02-07 DIAGNOSIS — N400 Enlarged prostate without lower urinary tract symptoms: Secondary | ICD-10-CM | POA: Diagnosis not present

## 2013-02-07 DIAGNOSIS — R630 Anorexia: Secondary | ICD-10-CM | POA: Diagnosis not present

## 2013-02-07 DIAGNOSIS — D129 Benign neoplasm of anus and anal canal: Secondary | ICD-10-CM | POA: Insufficient documentation

## 2013-02-07 DIAGNOSIS — Z1211 Encounter for screening for malignant neoplasm of colon: Secondary | ICD-10-CM

## 2013-02-07 DIAGNOSIS — R109 Unspecified abdominal pain: Secondary | ICD-10-CM | POA: Diagnosis present

## 2013-02-07 HISTORY — PX: COLONOSCOPY: SHX5424

## 2013-02-07 HISTORY — PX: ESOPHAGOGASTRODUODENOSCOPY: SHX5428

## 2013-02-07 SURGERY — COLONOSCOPY
Anesthesia: Moderate Sedation

## 2013-02-07 MED ORDER — MIDAZOLAM HCL 10 MG/2ML IJ SOLN
INTRAMUSCULAR | Status: AC
  Filled 2013-02-07: qty 4

## 2013-02-07 MED ORDER — FENTANYL CITRATE 0.05 MG/ML IJ SOLN
INTRAMUSCULAR | Status: AC
  Filled 2013-02-07: qty 4

## 2013-02-07 MED ORDER — FENTANYL CITRATE 0.05 MG/ML IJ SOLN
INTRAMUSCULAR | Status: DC | PRN
  Administered 2013-02-07 (×4): 25 ug via INTRAVENOUS

## 2013-02-07 MED ORDER — MIDAZOLAM HCL 5 MG/5ML IJ SOLN
INTRAMUSCULAR | Status: DC | PRN
  Administered 2013-02-07 (×2): 2 mg via INTRAVENOUS
  Administered 2013-02-07: 1 mg via INTRAVENOUS

## 2013-02-07 MED ORDER — SODIUM CHLORIDE 0.9 % IV SOLN
INTRAVENOUS | Status: DC
  Administered 2013-02-07: 500 mL via INTRAVENOUS

## 2013-02-07 MED ORDER — BUTAMBEN-TETRACAINE-BENZOCAINE 2-2-14 % EX AERO
INHALATION_SPRAY | CUTANEOUS | Status: DC | PRN
  Administered 2013-02-07: 2 via TOPICAL

## 2013-02-07 NOTE — Discharge Instructions (Addendum)
Please resume warfarin at your previously prescribed dose on Saturday, 02/09/2013  YOU HAD AN ENDOSCOPIC PROCEDURE TODAY: Refer to the procedure report that was given to you for any specific questions about what was found during the examination.  If the procedure report does not answer your questions, please call your gastroenterologist to clarify.  YOU SHOULD EXPECT: Some feelings of bloating in the abdomen. Passage of more gas than usual.  Walking can help get rid of the air that was put into your GI tract during the procedure and reduce the bloating. If you had a lower endoscopy (such as a colonoscopy or flexible sigmoidoscopy) you may notice spotting of blood in your stool or on the toilet paper.   DIET: Your first meal following the procedure should be a light meal and then it is ok to progress to your normal diet.  A half-sandwich or bowl of soup is an example of a good first meal.  Heavy or fried foods are harder to digest and may make you feel nasueas or bloated.  Drink plenty of fluids but you should avoid alcoholic beverages for 24 hours.  ACTIVITY: Your care partner should take you home directly after the procedure.  You should plan to take it easy, moving slowly for the rest of the day.  You can resume normal activity the day after the procedure however you should NOT DRIVE or use heavy machinery for 24 hours (because of the sedation medicines used during the test).    SYMPTOMS TO REPORT IMMEDIATELY  A gastroenterologist can be reached at any hour.  Please call your doctor's office for any of the following symptoms:   Following lower endoscopy (colonoscopy, flexible sigmoidoscopy)  Excessive amounts of blood in the stool  Significant tenderness, worsening of abdominal pains  Swelling of the abdomen that is new, acute  Fever of 100 or higher  Following upper endoscopy (EGD, EUS, ERCP)  Vomiting of blood or coffee ground material  New, significant abdominal pain  New, significant  chest pain or pain under the shoulder blades  Painful or persistently difficult swallowing  New shortness of breath  Black, tarry-looking stools  FOLLOW UP: If any biopsies were taken you will be contacted by phone or by letter within the next 1-3 weeks.  Call your gastroenterologist if you have not heard about the biopsies in 3 weeks.  Please also call your gastroenterologist's office with any specific questions about appointments or follow up tests. Gastrointestinal Endoscopy Care After Refer to this sheet in the next few weeks. These instructions provide you with information on caring for yourself after your procedure. Your caregiver may also give you more specific instructions. Your treatment has been planned according to current medical practices, but problems sometimes occur. Call your caregiver if you have any problems or questions after your procedure. HOME CARE INSTRUCTIONS  If you were given medicine to help you relax (sedative), do not drive, operate machinery, or sign important documents for 24 hours.  Avoid alcohol and hot or warm beverages for the first 24 hours after the procedure.  Only take over-the-counter or prescription medicines for pain, discomfort, or fever as directed by your caregiver. You may resume taking your normal medicines unless your caregiver tells you otherwise. Ask your caregiver when you may resume taking medicines that may cause bleeding, such as aspirin, clopidogrel, or warfarin.  You may return to your normal diet and activities on the day after your procedure, or as directed by your caregiver. Walking may help to  reduce any bloated feeling in your abdomen.  Drink enough fluids to keep your urine clear or pale yellow.  You may gargle with salt water if you have a sore throat. SEEK IMMEDIATE MEDICAL CARE IF:  You have severe nausea or vomiting.  You have severe abdominal pain, abdominal cramps that last longer than 6 hours, or abdominal swelling  (distention).  You have severe shoulder or back pain.  You have trouble swallowing.  You have shortness of breath, your breathing is shallow, or you are breathing faster than normal.  You have a fever or a rapid heartbeat.  You vomit blood or material that looks like coffee grounds.  You have bloody, black, or tarry stools. MAKE SURE YOU:  Understand these instructions.  Will watch your condition.  Will get help right away if you are not doing well or get worse. Document Released: 04/12/2004 Document Revised: 02/28/2012 Document Reviewed: 11/29/2011 [facility] Patient Information 2014 [place], [place]. Colonoscopy Care After These instructions give you information on caring for yourself after your procedure. Your doctor may also give you more specific instructions. Call your doctor if you have any problems or questions after your procedure. HOME CARE  Take it easy for the next 24 hours.  Rest.  Walk or use warm packs on your belly (abdomen) if you have belly cramping or gas.  Do not drive for 24 hours.  You may shower.  Do not sign important papers or use machinery for 24 hours.  Drink enough fluids to keep your pee (urine) clear or pale yellow.  Resume your normal diet. Avoid heavy or fried foods.  Avoid alcohol.  Continue taking your normal medicines.  Only take medicine as told by your doctor. Do not take aspirin. If you had growths (polyps) removed:  Do not take aspirin.  Do not drink alcohol for 7 days or as told by your doctor.  Eat a soft diet for 24 hours. GET HELP RIGHT AWAY IF:  You have a fever.  You pass clumps of tissue (blood clots) or fill the toilet with blood.  You have belly pain that gets worse and medicine does not help.  Your belly is puffy (swollen).  You feel sick to your stomach (nauseous) or throw up (vomit). MAKE SURE YOU:  Understand these instructions.  Will watch your condition.  Will get help right away if you are not  doing well or get worse. Document Released: 10/01/2010 Document Revised: 11/21/2011 Document Reviewed: 10/01/2010 [facility] Patient Information 2014 [place], [place].

## 2013-02-07 NOTE — H&P (View-Only) (Signed)
History of Present Illness:   Patient is a 78-year-old male with multiple medical problems on multiple medications including Coumadin. I saw the patient late April for evaluation of weight loss, constipation, abdominal pain and CT scan showing mild sigmoid diverticulitis. He completed the antibiotics i prescribed. Abdominal pain has resolved. Bowels movements better though he didn't start the Miralax. No further weight loss, feels okay. Patient is scheduled for a EGD and colonoscopy 02/07/13 for further evaluation.  Patient was asked to come in for a brief recheck prior to procedure. Dr. Wong has approved holding coumadin starting on 25th.   Current Medications, Allergies, Past Medical History, Past Surgical History, Family History and Social History were reviewed in [facility] electronic medical record.  Studies:   Ct Abdomen Pelvis W Contrast  01/03/2013   *RADIOLOGY REPORT*  Clinical Data: Abdominal pain, constipation, weight loss  CT ABDOMEN AND PELVIS WITH CONTRAST  Technique:  Multidetector CT imaging of the abdomen and pelvis was performed following the standard protocol during bolus administration of intravenous contrast.  Contrast: 100mL OMNIPAQUE IOHEXOL 300 MG/ML  SOLN  Comparison: 09/27/2005  Findings: Lung bases are essentially clear.  Liver is notable for focal fat/altered perfusion along the falciform ligament (series 2/image 22).  Spleen, pancreas, and adrenal glands are within normal limits.  Gallbladder is unremarkable.  No intrahepatic or extrahepatic ductal dilatation.  9 mm lesion along the posterior left upper kidney (series 2/image 23), with de-enhancement on delayed imaging, raising the possibility of a small solid renal neoplasm.  4.7 x 4.2 cm mildly complex/septated right upper pole renal cyst (series 2/image 26), benign (Bosniak II).  6.2 x 6.7 cm simple right lower pole cyst (series 2/image 42).  Additional smaller probable right renal cysts.  Bilateral renal vascular  calcifications without definite nonobstructing renal calculi.  No hydronephrosis.  No evidence of bowel obstruction.  Normal appendix.  Mild pericolonic stranding in the left lower quadrant (series 2/image 62), suggesting mild diverticulitis.  No drainable fluid collection or abscess.  No free air.  Atherosclerotic calcifications of the abdominal aorta and branch vessels.  No abdominopelvic ascites.  No suspicious abdominopelvic lymphadenopathy.  Prostatomegaly, measuring 7.6 cm in transverse dimension, and indenting the base of the bladder.  Bladder is within normal limits.  Moderate left inguinal hernia containing a single loop of sigmoid colon (series 2/image 84).  Mild degenerative changes of the visualized thoracolumbar spine.  IMPRESSION: Mild pericolonic stranding in the left lower quadrant, suggesting mild diverticulitis.  No drainable fluid collection or abscess.  No free air.  9 mm lesion in the posterior left upper kidney which is worrisome for possible solid renal neoplasm.  Follow-up MRI abdomen with/without contrast is suggested for further characterization.  Moderate left inguinal hernia containing a single loop of sigmoid colon.  No evidence of bowel obstruction.  Additional ancillary findings as above.  These results will be called to the ordering clinician or representative by the Radiologist Assistant, and communication documented in the PACS Dashboard.   Original Report Authenticated By: Sriyesh Krishnan, M.D.   Physical Exam: General: Well developed , white male in no acute distress Head: Normocephalic and atraumatic Eyes:  sclerae anicteric, conjunctiva pink  Ears: Normal auditory acuity Lungs: Clear throughout to auscultation Heart: Regular rate and rhythm Abdomen: Soft, non distended, non-tender. No masses, no hepatomegaly. Normal bowel sounds Musculoskeletal: Symmetrical with no gross deformities  Extremities: No edema  Neurological: Alert oriented x 4, grossly  nonfocal Psychological:  Alert and cooperative. Normal mood and affect    Assessment and Recommendations: 1. Recent weight loss, bowel change and mild diverticulitis by CTscan. Bowel movements normalizing, no further weight loss. His diverticulitis was successfully treated. Patient already scheduled for EGD and colonoscopy for evaluation.  Coumadin to be held prior to procedure. Patient looks / feels okay for endoscopic procedures.   2. Moderate left inguinal hernia containing a single loop of sigmoid colon on CTscan   3. Enlarged prostate by CAT scan, Evaluation per PCP.   4. Low TSH, If hyperthyroid then this could account for some of his weight loss.  He got a thyroid scan today.   Addendum: Reviewed and agree with management. Further recs after EGD/colonoscopy [name] [name], MD             

## 2013-02-07 NOTE — Op Note (Signed)
[facility] [address] [place], [address]   ENDOSCOPY PROCEDURE REPORT  PATIENT: Cody, [name]  MR#: [id number] BIRTHDATE: [birth year] , 77  yrs. old GENDER: Male ENDOSCOPIST: Beverley Fiedler, MD REFERRED BY:  Leodis Sias, M.D. PROCEDURE DATE:  02/07/2013 PROCEDURE:  EGD, diagnostic ASA CLASS:     Class III INDICATIONS:  abdominal pain.   decreased appetite. MEDICATIONS: These medications were titrated to patient response per physician's verbal order, Fentanyl 50 mcg IV, and Versed 4 mg IV TOPICAL ANESTHETIC: Cetacaine Spray  DESCRIPTION OF PROCEDURE: After the risks benefits and alternatives of the procedure were thoroughly explained, informed consent was obtained.  The Pentax Gastroscope [id number] endoscope was introduced through the mouth and advanced to the second portion of the duodenum. Without limitations.  The instrument was slowly withdrawn as the mucosa was fully examined.      The upper, middle and distal third of the esophagus were carefully inspected and no abnormalities were noted.  The z-line was well seen at the GEJ 40 cm from the incisors.  The endoscope was pushed into the fundus which was normal including a retroflexed view.  The antrum, gastric body, first and second part of the duodenum were unremarkable.  Retroflexed views revealed no abnormalities.     The scope was then withdrawn from the patient and the procedure completed.  COMPLICATIONS: There were no complications.  ENDOSCOPIC IMPRESSION: Normal EGD  RECOMMENDATIONS: 1.  Continue current medications 2.  Colonoscopy today as scheduled   eSigned:  Beverley Fiedler, MD 02/07/2013 10:07 AM   HY:QMVH, Scarlette Calico MD and The Patient

## 2013-02-07 NOTE — Op Note (Signed)
[facility] [address] [place], [address]   COLONOSCOPY PROCEDURE REPORT  PATIENT: [name], [name]  MR#: [id number] BIRTHDATE: [birth year] , 77  yrs. old GENDER: Male ENDOSCOPIST: Beverley Fiedler, MD REFERRED [id number] Modesto Charon, M.D. PROCEDURE DATE:  02/07/2013 PROCEDURE:   Colonoscopy with snare polypectomy ASA CLASS:   Class III INDICATIONS:average risk screening, follow up for previously diagnosed diverticulitis, and Weight loss. MEDICATIONS: These medications were titrated to patient response per physician's verbal order, Fentanyl 100 mcg IV, and Versed 5 mg IV  DESCRIPTION OF PROCEDURE:   After the risks benefits and alternatives of the procedure were thoroughly explained, informed consent was obtained.  A digital rectal exam revealed no rectal mass.   The Pentax Ped Colon [id number]  endoscope was introduced through the anus and advanced to the transverse colon. No adverse events experienced.   The quality of the prep was good, using MoviPrep  The instrument was then slowly withdrawn as the colon was fully examined.  COLON FINDINGS: A sessile polyp measuring 8 mm in size was found in the rectum.  A polypectomy was performed using snare cautery.  The resection was complete and the polyp tissue was completely retrieved.   There was severe diverticulosis noted at the splenic flexure, in the descending colon, and sigmoid colon with associated tortuosity and angulation. Due to significant tortuosity the scope could not be advanced beyond the mid transverse colon despite the use of a pediatric colonoscope, manual pressure and position change to supine. Retroflexed views revealed internal hemorrhoids.     The scope was withdrawn and the procedure completed.  COMPLICATIONS: There were no complications.  ENDOSCOPIC IMPRESSION: 1.   Sessile polyp measuring 8 mm in size was found in the rectum; polypectomy was performed using snare cautery 2.   There was severe  diverticulosis noted at the splenic flexure, in the descending colon, and sigmoid colon leading to tortuosity, incomplete colonoscopy.  RECOMMENDATIONS: 1.  Avoid NSAIDs (ibuprofen, naproxen, additional aspirin products) for at least 2 weeks 2.  Can resume warfarin on Saturday at previously prescribed dose 3.  Await pathology results 4.  High fiber diet 5.  Consider barium enema to examine proximal transverse and right colon for polyps (given incomplete examination today)   eSigned:  Beverley Fiedler, MD 02/07/2013 10:17 AM  cc: Orvan July MD and The Patient

## 2013-02-07 NOTE — Interval H&P Note (Signed)
History and Physical Interval Note: No new complaints. Presents for EGD/colonoscopy as discussed last week in clinic with Willette Cluster, C-NP The nature of the procedure, as well as the risks, benefits, and alternatives were carefully and thoroughly reviewed with the patient. Ample time for discussion and questions allowed. The patient understood, was satisfied, and agreed to proceed.  Warfarin stopped by patient 5 days ago   02/07/2013 9:18 AM  [name]  has presented today for surgery, with the diagnosis of weight loss/abd.pain/diverticulitis  The various methods of treatment have been discussed with the patient and family. After consideration of risks, benefits and other options for treatment, the patient has consented to  Procedure(s): COLONOSCOPY (N/A) ESOPHAGOGASTRODUODENOSCOPY (EGD) (N/A) as a surgical intervention .  The patient's history has been reviewed, patient examined, no change in status, stable for surgery.  I have reviewed the patient's chart and labs.  Questions were answered to the patient's satisfaction.     [name], [name]

## 2013-02-08 ENCOUNTER — Encounter: Payer: Self-pay | Admitting: Internal Medicine

## 2013-02-11 ENCOUNTER — Encounter (HOSPITAL_COMMUNITY): Payer: Self-pay | Admitting: Internal Medicine

## 2013-02-12 ENCOUNTER — Telehealth: Payer: Self-pay | Admitting: *Deleted

## 2013-02-12 DIAGNOSIS — D126 Benign neoplasm of colon, unspecified: Secondary | ICD-10-CM

## 2013-02-12 DIAGNOSIS — Z86010 Personal history of colonic polyps: Secondary | ICD-10-CM

## 2013-02-12 DIAGNOSIS — R933 Abnormal findings on diagnostic imaging of other parts of digestive tract: Secondary | ICD-10-CM

## 2013-02-12 NOTE — Telephone Encounter (Signed)
Letter from: Beverley Fiedler Reason for Letter: Results Review Comments: repeat colon 3 years (assuming neg barium enema) Please schedule barium enema for screening, incomplete colonoscopy and mail path letter Spoke with daughter, Harden Mo and she stated it's OK to schedule the BE and pt prefers [facility]. Mailed pt a letter with BE instructions for exam on 02/20/13 at [facility] at 09:30am. Also mailed path letter.

## 2013-02-20 ENCOUNTER — Ambulatory Visit (HOSPITAL_COMMUNITY)
Admission: RE | Admit: 2013-02-20 | Discharge: 2013-02-20 | Disposition: A | Discharge status: Home / Self Care | Payer: Medicare Other | Source: Ambulatory Visit | Attending: Internal Medicine | Admitting: Internal Medicine

## 2013-02-20 DIAGNOSIS — D126 Benign neoplasm of colon, unspecified: Secondary | ICD-10-CM

## 2013-02-20 DIAGNOSIS — K5730 Diverticulosis of large intestine without perforation or abscess without bleeding: Secondary | ICD-10-CM | POA: Insufficient documentation

## 2013-02-20 DIAGNOSIS — R933 Abnormal findings on diagnostic imaging of other parts of digestive tract: Secondary | ICD-10-CM

## 2013-02-20 DIAGNOSIS — Z86010 Personal history of colonic polyps: Secondary | ICD-10-CM

## 2013-02-20 DIAGNOSIS — K4090 Unilateral inguinal hernia, without obstruction or gangrene, not specified as recurrent: Secondary | ICD-10-CM | POA: Insufficient documentation

## 2013-02-21 ENCOUNTER — Telehealth: Payer: Self-pay | Admitting: *Deleted

## 2013-02-21 DIAGNOSIS — K439 Ventral hernia without obstruction or gangrene: Secondary | ICD-10-CM

## 2013-02-21 NOTE — Telephone Encounter (Signed)
Message copied by Florene Glen on Thu Feb 21, 2013  5:02 PM ------      Message from: Beverley Fiedler      Created: Wed Feb 20, 2013 10:40 PM       Barium enema was incomplete just as was recent colonoscopy due to large inguinal hernia containing a significant amount of colon (26 cm).      Therefore I cannot completely exclude lesions or polyps in the un-examined portions of the colon (cecum, descending colon, transverse colon)      Could consider surgical referral to discuss possible inguinal hernia repair      He can followup with GI as needed and further discuss with PCP as necessary ------

## 2013-02-21 NOTE — Telephone Encounter (Signed)
Informed pt's daughter of BE results and the need to see a Careers adviser. I asked her if she knew what a hernia is and she stated yes. She asked me to call her tomorrow after 2pm so she can explain it to the pt.

## 2013-02-22 NOTE — Telephone Encounter (Signed)
Spoke with patient's daughter and he is willing to go for a surgical referral. He wants to go after 02/27/13 because he has plans for that day.

## 2013-02-25 NOTE — Telephone Encounter (Signed)
Informed daughter, Harden Mo of appt with Dr Gaynelle Adu at 2:30pm on 03/07/13. Mamie stated understanding.

## 2013-03-07 ENCOUNTER — Encounter (INDEPENDENT_AMBULATORY_CARE_PROVIDER_SITE_OTHER): Payer: Self-pay | Admitting: General Surgery

## 2013-03-07 ENCOUNTER — Ambulatory Visit (INDEPENDENT_AMBULATORY_CARE_PROVIDER_SITE_OTHER): Payer: Medicare Other | Admitting: General Surgery

## 2013-03-07 VITALS — BP 142/70 | HR 58 | Temp 98.6°F | Resp 14 | Ht 71.5 in | Wt 176.4 lb

## 2013-03-07 DIAGNOSIS — K4030 Unilateral inguinal hernia, with obstruction, without gangrene, not specified as recurrent: Secondary | ICD-10-CM | POA: Insufficient documentation

## 2013-03-07 NOTE — Patient Instructions (Signed)
If you develop significant pain In your left groin, A hard painful lump in your left groin, nausea or vomiting and other symptoms like we discussed go to the ER  Call our office [phone] if you have not heard from [place] by Monday afternoon. We need to get permission from your cardiologist to hold your Coumadin for Surgery

## 2013-03-11 NOTE — Progress Notes (Signed)
Patient ID: [name], male   DOB: Dec 12, 1934, 78 y.o.   MRN: [id number]  Chief Complaint  Patient presents with  . New Evaluation    eval ventral hernia    HPI [name] is a 78 y.o. male.   HPI 78 year old Caucasian male with multiple medical issues referred by Dr. Rhea Belton for evaluation of a large left inguinal hernia. The patient had an issue of anorexia and unexplained weight loss a few months ago. He lost about 38 pounds. This prompted a referral to GI for panendoscopy. He had had a CT scan which demonstrated mild sigmoid diverticulitis for which she received appropriate antibiotic treatment. He no longer has left lower quadrant abdominal pain. The colonoscopy was unable to be completed due to a left inguinal hernia. He then underwent a barium Enema to evaluate his colon and also was incomplete due to a large segment of sigmoid colon being present in the left inguinal hernia. He states he has had prior right inguinal hernia repair in the past. He states that the left groin has Had a lump for quite some time. He recently developed left groin pain. He describes it as a burning and stinging sensation. He states that it feels like a fire in his groin. He denies any diarrhea or constipation. He reports having 2 bowel movements a day. He denies any vomiting. He denies any bloating.  He does take Coumadin. His cardiologist Dr. Clide Cliff. He denies any alcohol or tobacco use.  He does get radioactive iodine for his thyroid.  Past Medical History  Diagnosis Date  . Atrial fibrillation     coumadin therapy;  echo 1/10: EF 65-70%, mild diast dysfxn  . COPD (chronic obstructive pulmonary disease)     admitted 5/11  . Hyperlipidemia   . GERD (gastroesophageal reflux disease)   . Allergic rhinitis   . Asthma   . Anxiety   . Hiatal hernia   . CAD (coronary artery disease)     cath 7/09: pCFX 25%, mRCA 25%, EF 60%;  Myoview 6/11: low risk, EF 67%  . History of pneumonia   . Sebaceous  cyst     scalp  . SVT (supraventricular tachycardia)     s/p RFCA 05/2010  . Emphysema   . First degree AV block   . History of TIAs   . Hypertension   . Stroke     TIA    Past Surgical History  Procedure Laterality Date  . Inguinal hernia repair Right   . Cardiac surgery      a fib surgery x 2  . Knee surgery Right 1981  . Cystectomy  2012    removed from head  . Back surgery  1980  . Cardiac valve surgery    . Colonoscopy N/A 02/07/2013    Procedure: COLONOSCOPY;  Surgeon: Beverley Fiedler, MD;  Location: [facility];  Service: Gastroenterology;  Laterality: N/A;  . Esophagogastroduodenoscopy N/A 02/07/2013    Procedure: ESOPHAGOGASTRODUODENOSCOPY (EGD);  Surgeon: Beverley Fiedler, MD;  Location: [facility];  Service: Gastroenterology;  Laterality: N/A;    Family History  Problem Relation Age of Onset  . Hypertension Mother   . COPD Father   . COPD Sister   . Cancer Brother     lung  . Lung cancer Mother     Social History History  Substance Use Topics  . Smoking status: Former Smoker -- 3.00 packs/day for 40 years    Types: Cigarettes    Quit date:  09/13/1995  . Smokeless tobacco: Never Used  . Alcohol Use: No    No Known Allergies  Current Outpatient Prescriptions  Medication Sig Dispense Refill  . albuterol (PROVENTIL) (2.5 MG/3ML) 0.083% nebulizer solution Take 3 mLs (2.5 mg total) by nebulization every 4 (four) hours as needed. For shortness of breath  75 mL  4  . ALPRAZolam (XANAX) 0.5 MG tablet Take 1 tablet (0.5 mg total) by mouth 3 (three) times daily as needed for sleep.  90 tablet  0  . AMBULATORY NON FORMULARY MEDICATION O2 @ night @ 3LPM      . HYDROcodone-acetaminophen (NORCO/VICODIN) 5-325 MG per tablet Take 1 tablet by mouth every 6 (six) hours as needed for pain.  90 tablet  1  . mometasone-formoterol (DULERA) 200-5 MCG/ACT AERO Inhale 2 puffs into the lungs 2 (two) times daily.  13 g  4  . montelukast (SINGULAIR) 10 MG tablet Take 1 tablet (10 mg  total) by mouth daily.  30 tablet  4  . omeprazole (PRILOSEC) 20 MG capsule Take 2 capsules (40 mg total) by mouth daily.  60 capsule  5  . roflumilast (DALIRESP) 500 MCG TABS tablet Take 1 tablet (500 mcg total) by mouth daily.  30 tablet  4  . tiotropium (SPIRIVA HANDIHALER) 18 MCG inhalation capsule Place 1 capsule (18 mcg total) into inhaler and inhale daily.  30 capsule  4  . warfarin (COUMADIN) 2 MG tablet Take 1-2 tablets (2-4 mg total) by mouth every evening. Schedule unknown.  60 tablet  2  . [DISCONTINUED] albuterol-ipratropium (COMBIVENT) 18-103 MCG/ACT inhaler Inhale 1 puff into the lungs every 6 (six) hours as needed. For wheezing      . [DISCONTINUED] atorvastatin (LIPITOR) 20 MG tablet Take 20 mg by mouth daily.        . [DISCONTINUED] clonazePAM (KLONOPIN) 0.5 MG tablet Take 0.5 mg by mouth at bedtime as needed. For sleep       . [DISCONTINUED] furosemide (LASIX) 20 MG tablet Take 20 mg by mouth daily.         No current facility-administered medications for this visit.    Review of Systems Review of Systems  Constitutional: Positive for unexpected weight change. Negative for fever, chills and appetite change.  HENT: Negative for congestion and trouble swallowing.   Eyes: Negative for visual disturbance.  Respiratory: Positive for cough. Negative for chest tightness and shortness of breath.   Cardiovascular: Positive for leg swelling. Negative for chest pain.       No PND, no orthopnea, no DOE  Gastrointestinal:       See HPI  Genitourinary: Negative for dysuria and hematuria.       +nocturia; denies weak stream, dribbling, sensation of incomplete emptying  Musculoskeletal: Negative.   Skin: Negative for rash.  Neurological: Positive for headaches. Negative for seizures and speech difficulty.       Denies TIAs and amaurosis fugax  Hematological: Does not bruise/bleed easily.  Psychiatric/Behavioral: Negative for behavioral problems and confusion.    Blood pressure  142/70, pulse 58, temperature 98.6 F (37 C), temperature source Temporal, resp. rate 14, height 5' 11.5" (1.816 m), weight 176 lb 6.4 oz (80.015 kg).  Physical Exam Physical Exam  Vitals reviewed. Constitutional: He is oriented to person, place, and time. He appears well-developed. No distress.  Appears slightly disheveled  HENT:  Head: Normocephalic and atraumatic.  Right Ear: External ear normal.  Left Ear: External ear normal.  Eyes: Conjunctivae are normal. No scleral icterus.  Neck: Normal range of motion. Neck supple. No tracheal deviation present. No thyromegaly present.  Cardiovascular: Normal rate, regular rhythm, normal heart sounds and intact distal pulses.   Pulmonary/Chest: Effort normal and breath sounds normal. No respiratory distress. He has no wheezes.  Abdominal: Soft. He exhibits no distension. There is no tenderness. There is no rebound. A hernia is present. Hernia confirmed positive in the left inguinal area. Hernia confirmed negative in the right inguinal area.  Genitourinary: Testes normal and penis normal.     Musculoskeletal: Normal range of motion. He exhibits no edema.  Lymphadenopathy:    He has no cervical adenopathy.       Right: No inguinal adenopathy present.  Neurological: He is alert and oriented to person, place, and time.  Skin: Skin is warm and dry. He is not diaphoretic.  Psychiatric: He has a normal mood and affect. His behavior is normal. Judgment and thought content normal.    Data Reviewed Penny Pia office note 5/23 Dr Nash Dimmer office note 5/19 BE colon - incomplete due to L hernia, diverticulosis CT abd/pel 01/01/13 - mild diverticulitis, large left inguinal hernia, 9mm L kidney lesion  Assessment    Chronically incarcerated Left inguinal hernia Sigmoid diverticulosis Weight loss COPD Paroxysmal A Fib on coumadin CAD     Plan    He is a very large left inguinal hernia. I am unable to reduce an office. It appears as a  chronically incarcerated left inguinal hernia.  We discussed the etiology of inguinal hernias. We discussed the signs & symptoms of strangulation.  We discussed non-operative and operative management.  The patient has elected To proceed with an open repair of a left incarcerated inguinal hernia with mesh   I described the procedure in detail.  The patient was given educational material. We discussed the risks and benefits including but not limited to bleeding, infection, chronic inguinal pain, nerve entrapment, hernia recurrence, mesh complications, hematoma formation, urinary retention, injury to the testicles, numbness in the groin, blood clots, injury to the surrounding structures, and anesthesia risk. We also discussed the typical post operative recovery course, including no heavy lifting for 6 weeks. I explained that the likelihood of improvement of their symptoms is Good.  I explained that he was at higher risk for postoperative urinary retention, hematoma formation, chronic inguinal pain, hernia recurrence, injury to surrounding structures given the large nature of his inguinal hernia  We will need to obtain permission to hold his Coumadin perioperatively.  Once we get cardiac clearance we will schedule him for an open repair of a left incarcerated inguinal hernia with mesh  Mary Sella. Andrey Campanile, MD, FACS General, Bariatric, & Minimally Invasive Surgery [facility], [place]          [facility] 03/11/2013, 12:56 PM

## 2013-03-12 ENCOUNTER — Telehealth: Payer: Self-pay | Admitting: Pharmacist

## 2013-03-12 NOTE — Telephone Encounter (Signed)
appt to recheck protime made for 03/14/13

## 2013-03-13 ENCOUNTER — Telehealth: Payer: Self-pay | Admitting: Internal Medicine

## 2013-03-13 ENCOUNTER — Telehealth (INDEPENDENT_AMBULATORY_CARE_PROVIDER_SITE_OTHER): Payer: Self-pay | Admitting: General Surgery

## 2013-03-13 NOTE — Telephone Encounter (Signed)
Debra with Dr Marikay Alar called re: cardiac clearance. Wanted to let us know that they have not seen patient since 2012 and he will need an appt. She will contact the patient to get him in for an appt and they will address cardiac clearance after he is seen.

## 2013-03-13 NOTE — Telephone Encounter (Signed)
Left message for lori, Spoke with pt dtr, the pt has not been seen since 2012. appt made with dr Graciela Husbands for clearance.

## 2013-03-13 NOTE — Telephone Encounter (Signed)
New problem      Cardiac clearance that was sent through  Epic on  6/26 @ 4 pm

## 2013-03-14 ENCOUNTER — Ambulatory Visit (INDEPENDENT_AMBULATORY_CARE_PROVIDER_SITE_OTHER): Payer: Medicare Other | Admitting: Pharmacist

## 2013-03-14 DIAGNOSIS — I4891 Unspecified atrial fibrillation: Secondary | ICD-10-CM

## 2013-03-14 LAB — POCT INR: INR: 1.9

## 2013-03-14 NOTE — Patient Instructions (Signed)
Anticoagulation Dose Instructions as of 03/14/2013     Glynis Smiles Tue Wed Thu Fri Sat   New Dose 2 mg 4 mg 2 mg 2 mg 2 mg 4 mg 2 mg    Description       Take 2 tablets today - Thursday, July 3rd then restart regular dose of 2mg  [= 1 tablet] daily except on Mondays and Fridays take 4mg  [ = 2 tablets].        INR was 1.9 today

## 2013-03-20 ENCOUNTER — Ambulatory Visit: Payer: Medicare Other | Admitting: Internal Medicine

## 2013-03-22 ENCOUNTER — Other Ambulatory Visit: Payer: Self-pay

## 2013-03-22 DIAGNOSIS — M1990 Unspecified osteoarthritis, unspecified site: Secondary | ICD-10-CM

## 2013-03-22 DIAGNOSIS — G894 Chronic pain syndrome: Secondary | ICD-10-CM

## 2013-03-22 MED ORDER — WARFARIN SODIUM 2 MG PO TABS: 2.0000 mg | ORAL_TABLET | Freq: Every evening | ORAL | Status: DC

## 2013-03-22 NOTE — Telephone Encounter (Signed)
Last seen Dr Modesto Charon 01/28/13   If approve print and have nurse call patient to pick  up

## 2013-03-25 NOTE — Telephone Encounter (Signed)
What needs to be  Refilled?

## 2013-03-28 ENCOUNTER — Encounter: Payer: Self-pay | Admitting: *Deleted

## 2013-03-28 ENCOUNTER — Other Ambulatory Visit: Payer: Self-pay

## 2013-03-28 DIAGNOSIS — I4891 Unspecified atrial fibrillation: Secondary | ICD-10-CM

## 2013-03-28 DIAGNOSIS — G894 Chronic pain syndrome: Secondary | ICD-10-CM

## 2013-03-28 DIAGNOSIS — M1990 Unspecified osteoarthritis, unspecified site: Secondary | ICD-10-CM

## 2013-03-28 MED ORDER — WARFARIN SODIUM 2 MG PO TABS: 2.0000 mg | ORAL_TABLET | Freq: Every evening | ORAL | Status: DC

## 2013-03-29 ENCOUNTER — Encounter: Payer: Self-pay | Admitting: Internal Medicine

## 2013-03-29 ENCOUNTER — Ambulatory Visit (INDEPENDENT_AMBULATORY_CARE_PROVIDER_SITE_OTHER): Payer: Medicare Other | Admitting: Internal Medicine

## 2013-03-29 ENCOUNTER — Telehealth: Payer: Self-pay | Admitting: Family Medicine

## 2013-03-29 VITALS — BP 136/68 | HR 83 | Ht 71.0 in | Wt 178.2 lb

## 2013-03-29 DIAGNOSIS — Z01810 Encounter for preprocedural cardiovascular examination: Secondary | ICD-10-CM

## 2013-03-29 DIAGNOSIS — I471 Supraventricular tachycardia: Secondary | ICD-10-CM

## 2013-03-29 DIAGNOSIS — I4891 Unspecified atrial fibrillation: Secondary | ICD-10-CM

## 2013-03-29 DIAGNOSIS — G459 Transient cerebral ischemic attack, unspecified: Secondary | ICD-10-CM

## 2013-03-29 MED ORDER — HYDROCODONE-ACETAMINOPHEN 5-325 MG PO TABS: 1.0000 | ORAL_TABLET | Freq: Four times a day (QID) | ORAL | Status: DC | PRN

## 2013-03-29 MED ORDER — WARFARIN SODIUM 2 MG PO TABS: 2.0000 mg | ORAL_TABLET | Freq: Every evening | ORAL | Status: DC

## 2013-03-29 NOTE — Assessment & Plan Note (Signed)
Pt functionally stable with > mets without active CHF valvular or arrhythmic disease  his surgery should be at accpetable risk With his hx of TIA and afib, would recommend bridging at the time of surgery

## 2013-03-29 NOTE — Assessment & Plan Note (Signed)
No symptomatic atrial fibrillation

## 2013-03-29 NOTE — Assessment & Plan Note (Signed)
Status post ablation without recurrence.

## 2013-03-29 NOTE — Progress Notes (Signed)
Patient Care Team: Ileana Ladd, MD as PCP - General (Family Medicine)   HPI  [name] is a 78 y.o. male  unfortunately his past medical history is poorly available in epic He has a history of nonobstructive coronary disease by catheterization March 2009; he has a history of TIAs.  Previously he had  oxygen-dependent COPD but no longer uses it.   Is functionally quite stable. He is able to climb stairs without difficulty. He has had no syncope or palpitations. T     Past Medical History  Diagnosis Date  . Atrial fibrillation     coumadin therapy;  echo 1/10: EF 65-70%, mild diast dysfxn  . COPD (chronic obstructive pulmonary disease)     admitted 5/11  . Hyperlipidemia   . GERD (gastroesophageal reflux disease)   . Allergic rhinitis   . Asthma   . Anxiety   . Hiatal hernia   . CAD (coronary artery disease)     cath 7/09: pCFX 25%, mRCA 25%, EF 60%;  Myoview 6/11: low risk, EF 67%  . History of pneumonia   . Sebaceous cyst     scalp  . SVT (supraventricular tachycardia)     s/p RFCA 05/2010  . Emphysema   . First degree AV block   . History of TIAs   . Hypertension   . Stroke     TIA    Past Surgical History  Procedure Laterality Date  . Inguinal hernia repair Right   . Cardiac surgery      a fib surgery x 2  . Knee surgery Right 1981  . Cystectomy  2012    removed from head  . Back surgery  1980  . Cardiac valve surgery    . Colonoscopy N/A 02/07/2013    Procedure: COLONOSCOPY;  Surgeon: Beverley Fiedler, MD;  Location: [facility];  Service: Gastroenterology;  Laterality: N/A;  . Esophagogastroduodenoscopy N/A 02/07/2013    Procedure: ESOPHAGOGASTRODUODENOSCOPY (EGD);  Surgeon: Beverley Fiedler, MD;  Location: [facility];  Service: Gastroenterology;  Laterality: N/A;    Current Outpatient Prescriptions  Medication Sig Dispense Refill  . albuterol (PROVENTIL) (2.5 MG/3ML) 0.083% nebulizer solution Take 3 mLs (2.5 mg total) by nebulization every 4 (four)  hours as needed. For shortness of breath  75 mL  4  . ALPRAZolam (XANAX) 0.5 MG tablet Take 1 tablet (0.5 mg total) by mouth 3 (three) times daily as needed for sleep.  90 tablet  0  . AMBULATORY NON FORMULARY MEDICATION O2 @ night @ 3LPM      . HYDROcodone-acetaminophen (NORCO/VICODIN) 5-325 MG per tablet Take 1 tablet by mouth every 6 (six) hours as needed for pain.  90 tablet  1  . mometasone-formoterol (DULERA) 200-5 MCG/ACT AERO Inhale 2 puffs into the lungs 2 (two) times daily.  13 g  4  . montelukast (SINGULAIR) 10 MG tablet Take 1 tablet (10 mg total) by mouth daily.  30 tablet  4  . omeprazole (PRILOSEC) 20 MG capsule Take 2 capsules (40 mg total) by mouth daily.  60 capsule  5  . roflumilast (DALIRESP) 500 MCG TABS tablet Take 1 tablet (500 mcg total) by mouth daily.  30 tablet  4  . tiotropium (SPIRIVA HANDIHALER) 18 MCG inhalation capsule Place 1 capsule (18 mcg total) into inhaler and inhale daily.  30 capsule  4  . warfarin (COUMADIN) 2 MG tablet Take 1-2 tablets (2-4 mg total) by mouth every evening. Schedule unknown.  60 tablet  0  . [DISCONTINUED] albuterol-ipratropium (COMBIVENT) 18-103 MCG/ACT inhaler Inhale 1 puff into the lungs every 6 (six) hours as needed. For wheezing      . [DISCONTINUED] atorvastatin (LIPITOR) 20 MG tablet Take 20 mg by mouth daily.        . [DISCONTINUED] clonazePAM (KLONOPIN) 0.5 MG tablet Take 0.5 mg by mouth at bedtime as needed. For sleep       . [DISCONTINUED] furosemide (LASIX) 20 MG tablet Take 20 mg by mouth daily.         No current facility-administered medications for this visit.    No Known Allergies  Review of Systems negative except from HPI and PMH  Physical Exam BP 136/68  Pulse 83  Ht 5\' 11"  (1.803 m)  Wt 178 lb 3.2 oz (80.831 kg)  BMI 24.86 kg/m2 Well developed and well nourished in no acute distress HENT normal E scleral and icterus clear Neck Supple JVP flat; carotids brisk and full Clear to ausculation but decreased   distant but Regular rate and rhythm, no murmurs gallops or rub Soft with active bowel sounds No clubbing cyanosis no Edema Alert and oriented, grossly normal motor and sensory function Skin Warm and Dry  ECG demonstrates sinus rhythm at 83 Intervals 19/10/30 Access 47  Assessment and  Plan

## 2013-03-29 NOTE — Telephone Encounter (Signed)
Pt aware rx called to [facility]

## 2013-03-29 NOTE — Telephone Encounter (Signed)
Refill coumadin as well. Sallee Hogrefe P. Modesto Charon, M.D.

## 2013-03-29 NOTE — Telephone Encounter (Signed)
Warfarin and hydrocodone called to [facility] [place]

## 2013-03-29 NOTE — Telephone Encounter (Signed)
Okay to refill? 

## 2013-03-29 NOTE — Assessment & Plan Note (Signed)
On warfarin; would need to be bridged at the time of surgery I

## 2013-04-04 ENCOUNTER — Telehealth: Payer: Self-pay | Admitting: Internal Medicine

## 2013-04-04 ENCOUNTER — Telehealth (INDEPENDENT_AMBULATORY_CARE_PROVIDER_SITE_OTHER): Payer: Self-pay | Admitting: General Surgery

## 2013-04-04 ENCOUNTER — Telehealth: Payer: Self-pay | Admitting: Pharmacist

## 2013-04-04 NOTE — Telephone Encounter (Signed)
Kennon Rounds called to inform me that the patient's PCP [facility] would be handling the Lovenox Bridging instead of them as just and FYI.Marland KitchenMarland KitchenI verbalized agreement

## 2013-04-04 NOTE — Telephone Encounter (Signed)
New Prob     Needing some clarification regarding cardiac clearance. Please call.

## 2013-04-04 NOTE — Telephone Encounter (Signed)
         Cameron, [place] - 04/04/2013 1:43 PM ','<More Detail >>       Mariane Masters, [employer]       Sent: Thu April 04, 2013 3:25 PM    To: Henrene Pastor, PHARMD    Cc: June Leap                           [facility] Size     Small Medium Large Extra Extra Large             Emeterio Reeve Description: 78 year old male  04/04/2013 Telephone Provider: Duke Salvia, MD  MRN: [id number] Department: [address]                Call Documentation    Mariane Masters, [facility] at 04/04/2013 3:25 PM    Status: Signed             Pt followed by [facility] for anticoagulation. Will send information to the clinic there. Truett Mcfarlan with Dr. Andrey Campanile aware. No surgery date set at this point.         Antony Odea, RN at 04/04/2013 2:26 PM    Status: Signed             Per Dr Odessa Fleming note pt needs to be Lovenox bridged. Glendy Barsanti/ Dr Tawana Scale office states the pt and daughter Mimi didn't know anything about this.  Pt needs to be contacted.  I will forward to [facility] PharmD to address.         Erlinda Hong at 04/04/2013 1:44 PM    Status: Signed             New Prob  Needing some clarification regarding cardiac clearance. Please call.             Encounter MyChart Messages    No messages in this encounter         Routing History    Priority Sent On From To Message Type    04/04/2013 3:25 PM Mariane Masters, RPH June Leap [place], [place] Patient Calls    04/04/2013 2:26 PM Antony Odea, RN Mariane Masters, [facility] Anticoag [address] Patient Calls    04/04/2013 1:44 PM Irene Limbo Triage Patient Calls      Created by    Erlinda Hong on 04/04/2013 01:43 PM                           Visit Pharmacy    [facility] [phone] - [place], [place] - [address]         Contacts      Type Contact Phone   04/04/2013 1:43 PM Phone (Incoming) Lawson Fiscal- Dr. Andrey Campanile' s Office [phone]   04/04/2013 2:18 PM  Phone (Outgoing) Lawson Fiscal- Dr. Andrey Campanile' s Office [phone]   04/04/2013 3:21 PM Phone (Outgoing) Lawson Fiscal- Dr. Andrey Campanile' s Office [phone]   Left Message- LM for Lawson Fiscal to call back regarding bridging.       Date of surgery is 04/15/13

## 2013-04-04 NOTE — Telephone Encounter (Signed)
Per verbal orders from Ol[place]o and Olmito we are done on our end

## 2013-04-04 NOTE — Telephone Encounter (Signed)
error 

## 2013-04-04 NOTE — Telephone Encounter (Signed)
Pt followed by [facility] for anticoagulation.  Will send information to the clinic there.  Lori with Dr. Andrey Campanile aware.  No surgery date set at this point.

## 2013-04-04 NOTE — Telephone Encounter (Signed)
Per Jodette Dr.Klein's office pt is cleared for surgery and they will contact the pt or daughter Mamie re: stopping the coumadin and the how to on the Lovenox Bridging.Marland KitchenMarland KitchenCecilie [name] has the orders as of 7/24 @ 2:35

## 2013-04-04 NOTE — Telephone Encounter (Signed)
Called surgeon's office to find out when surgery scheduled.  It's 04/15/13. Will make plan for bridging and contact daughter and forward plan to surgeon

## 2013-04-04 NOTE — Telephone Encounter (Signed)
Called and spoke with pt's daughter who stated that she didn't know anything about Lovenox bridging...called and spoke with Ashley/Dr.Klein's office [phone] who was sending a message to triage since his nurse wasn't there..waiting to hear back from Dr.Klein's office about the bridging but will go ahead and give the orders to the schedulers so that we can go ahead and get him on the books

## 2013-04-04 NOTE — Telephone Encounter (Signed)
Message copied by June Leap on Thu Apr 04, 2013  1:34 PM ------      Message from: Andrey Campanile, ERIC M      Created: Sat Mar 30, 2013  8:06 AM       Cardiac clearance. Will need lovenox bridge            Thanks      wilson      ----- Message -----         From: Duke Salvia, MD         Sent: 03/29/2013   3:57 PM           To: [name], MD            Dr Gracelyn Nurse       ------

## 2013-04-04 NOTE — Telephone Encounter (Signed)
Per Dr Odessa Fleming note pt needs to be Lovenox bridged. [name]/ Dr Tawana Scale office states the pt and daughter [name] didn't know anything about this. Pt needs to be contacted. I will forward to [facility] PharmD to address.

## 2013-04-05 MED ORDER — ENOXAPARIN SODIUM 80 MG/0.8ML ~~LOC~~ SOLN: 80.0000 mg | Freq: Two times a day (BID) | SUBCUTANEOUS | Status: DC

## 2013-04-05 NOTE — Telephone Encounter (Signed)
Tammy called and just wanted to fax [place] a copy of patient's instructions for Lovenox bridging just as an FYI since she is handling it...patient is given the same instructions and Tammy is to answer any questions...EW is aware and signed off on our copy 04/05/13 @  10:30

## 2013-04-05 NOTE — Telephone Encounter (Signed)
Plan for Bridging Lovenox printed for patient (left at front desk) and faxed to [facility] at Dr Tawana Scale office. Last dose of warfarin will be Tuesday, July 29th and patient will continue to hold until after surgery.  Lovenox 80mg  - inject 1 syringeful every 12 hours will start on Thrusday, July 31st and continue until the evening of Sunday, August 3rd.   Hernia surgery is planned for Monday, August 4th.  Recommend restart warfarin and lovenox after surgery once hemostasis is adequate and recheck INR in 1-2 days.

## 2013-04-08 ENCOUNTER — Encounter (HOSPITAL_COMMUNITY): Payer: Self-pay | Admitting: Pharmacy Technician

## 2013-04-08 ENCOUNTER — Encounter (HOSPITAL_COMMUNITY): Payer: Self-pay

## 2013-04-08 ENCOUNTER — Encounter (HOSPITAL_COMMUNITY)
Admission: RE | Admit: 2013-04-08 | Discharge: 2013-04-08 | Disposition: A | Discharge status: Home / Self Care | Payer: Medicare Other | Source: Ambulatory Visit | Attending: General Surgery | Admitting: General Surgery

## 2013-04-08 DIAGNOSIS — Z01812 Encounter for preprocedural laboratory examination: Secondary | ICD-10-CM | POA: Insufficient documentation

## 2013-04-08 LAB — BASIC METABOLIC PANEL
BUN: 6 mg/dL (ref 6–23)
Calcium: 10 mg/dL (ref 8.4–10.5)
GFR calc Af Amer: >90 mL/min (ref >90)
GFR calc non Af Amer: 86 mL/min — ABNORMAL LOW (ref >90)
Potassium: 3.4 mEq/L — ABNORMAL LOW (ref 3.5–5.1)
Sodium: 140 mEq/L (ref 135–145)

## 2013-04-08 LAB — CBC WITH DIFFERENTIAL/PLATELET
Basophils Absolute: 0 10*3/uL (ref 0.0–0.1)
Basophils Relative: 0 % (ref 0–1)
Eosinophils Absolute: 0.2 10*3/uL (ref 0.0–0.7)
Hemoglobin: 13.6 g/dL (ref 13.0–17.0)
MCHC: 33.8 g/dL (ref 30.0–36.0)
Monocytes Relative: 9 % (ref 3–12)
Neutro Abs: 6.1 10*3/uL (ref 1.7–7.7)
Neutrophils Relative %: 69 % (ref 43–77)
Platelets: 244 10*3/uL (ref 150–400)

## 2013-04-08 LAB — PROTIME-INR
INR: 2.2 — ABNORMAL HIGH (ref 0.00–1.49)
Prothrombin Time: 23.7 seconds — ABNORMAL HIGH (ref 11.6–15.2)

## 2013-04-08 LAB — MRSA PCR SCREENING: MRSA by PCR: NEGATIVE

## 2013-04-08 NOTE — Patient Instructions (Addendum)
20 Keanan A Sahakian  04/08/2013   Your procedure is scheduled on: 04-15-2013  Report to [facility] at 1000 AM.  Call this number if you have problems the morning of surgery [phone]   Remember:   Do not eat food or drink liquids :After Midnight.     Take these medicines the morning of surgery with A SIP OF WATER: spiriva , dulera inhaler if needed, bring and leave dulera inhaler with your  Daughter, albuterol nebulizer                                SEE [facility] PREPARING FOR SURGERY SHEET   Do not wear jewelry, make-up or nail polish.  Do not wear lotions, powders, or perfumes. You may wear deodorant.   Men may shave face and neck.  Do not bring valuables to the hospital. [facility] IS NOT RESPONSIBLE FOR VALUEABLES.  Contacts, dentures or bridgework may not be worn into surgery.  Leave suitcase in the car. After surgery it may be brought to your room.  For patients admitted to the hospital, checkout time is 11:00 AM the day of discharge.   Patients discharged the day of surgery will not be allowed to drive home.  Name and phone number of your driver:  Special Instructions: N/A   Please read over the following fact sheets that you were given: MRSA Information.  Call Cain Sieve RN pre op nurse if needed [phone]    FAILURE TO FOLLOW THESE INSTRUCTIONS MAY RESULT IN THE CANCELLATION OF YOUR SURGERY.  PATIENT SIGNATURE___________________________________________  NURSE SIGNATURE_____________________________________________

## 2013-04-08 NOTE — Telephone Encounter (Signed)
lovenox briding plan completed and copy left for pt and faxed to surgeon's office.

## 2013-04-08 NOTE — Progress Notes (Signed)
Cardiac clearance note lov note dr Graciela Husbands epic 03-29-2013 01-01-13 chest 2 view xray epic

## 2013-04-14 MED ORDER — DEXTROSE 5 % IV SOLN
3.0000 g | INTRAVENOUS | Status: DC
  Filled 2013-04-14: qty 3000

## 2013-04-15 ENCOUNTER — Encounter (HOSPITAL_COMMUNITY): Payer: Self-pay | Admitting: Certified Registered"

## 2013-04-15 ENCOUNTER — Observation Stay (HOSPITAL_COMMUNITY)
Admission: RE | Admit: 2013-04-15 | Discharge: 2013-04-16 | Disposition: A | Discharge status: Home / Self Care | Payer: Medicare Other | Source: Ambulatory Visit | Attending: General Surgery | Admitting: General Surgery

## 2013-04-15 ENCOUNTER — Encounter (HOSPITAL_COMMUNITY)
Admission: RE | Disposition: A | Discharge status: Home / Self Care | Payer: Self-pay | Source: Ambulatory Visit | Attending: General Surgery

## 2013-04-15 ENCOUNTER — Encounter (HOSPITAL_COMMUNITY): Payer: Self-pay | Admitting: *Deleted

## 2013-04-15 ENCOUNTER — Ambulatory Visit (HOSPITAL_COMMUNITY): Payer: Medicare Other | Admitting: Certified Registered"

## 2013-04-15 DIAGNOSIS — Y838 Other surgical procedures as the cause of abnormal reaction of the patient, or of later complication, without mention of misadventure at the time of the procedure: Secondary | ICD-10-CM | POA: Insufficient documentation

## 2013-04-15 DIAGNOSIS — K4090 Unilateral inguinal hernia, without obstruction or gangrene, not specified as recurrent: Secondary | ICD-10-CM

## 2013-04-15 DIAGNOSIS — J449 Chronic obstructive pulmonary disease, unspecified: Secondary | ICD-10-CM | POA: Insufficient documentation

## 2013-04-15 DIAGNOSIS — I440 Atrioventricular block, first degree: Secondary | ICD-10-CM

## 2013-04-15 DIAGNOSIS — R339 Retention of urine, unspecified: Secondary | ICD-10-CM | POA: Insufficient documentation

## 2013-04-15 DIAGNOSIS — K5730 Diverticulosis of large intestine without perforation or abscess without bleeding: Secondary | ICD-10-CM | POA: Insufficient documentation

## 2013-04-15 DIAGNOSIS — Z8673 Personal history of transient ischemic attack (TIA), and cerebral infarction without residual deficits: Secondary | ICD-10-CM | POA: Insufficient documentation

## 2013-04-15 DIAGNOSIS — I4891 Unspecified atrial fibrillation: Secondary | ICD-10-CM | POA: Diagnosis present

## 2013-04-15 DIAGNOSIS — J439 Emphysema, unspecified: Secondary | ICD-10-CM | POA: Diagnosis present

## 2013-04-15 DIAGNOSIS — IMO0002 Reserved for concepts with insufficient information to code with codable children: Secondary | ICD-10-CM | POA: Insufficient documentation

## 2013-04-15 DIAGNOSIS — J4489 Other specified chronic obstructive pulmonary disease: Secondary | ICD-10-CM | POA: Insufficient documentation

## 2013-04-15 DIAGNOSIS — R338 Other retention of urine: Secondary | ICD-10-CM | POA: Diagnosis not present

## 2013-04-15 DIAGNOSIS — R634 Abnormal weight loss: Secondary | ICD-10-CM | POA: Insufficient documentation

## 2013-04-15 DIAGNOSIS — I2510 Atherosclerotic heart disease of native coronary artery without angina pectoris: Secondary | ICD-10-CM | POA: Insufficient documentation

## 2013-04-15 DIAGNOSIS — K219 Gastro-esophageal reflux disease without esophagitis: Secondary | ICD-10-CM | POA: Insufficient documentation

## 2013-04-15 DIAGNOSIS — E785 Hyperlipidemia, unspecified: Secondary | ICD-10-CM | POA: Insufficient documentation

## 2013-04-15 DIAGNOSIS — K4030 Unilateral inguinal hernia, with obstruction, without gangrene, not specified as recurrent: Principal | ICD-10-CM | POA: Insufficient documentation

## 2013-04-15 DIAGNOSIS — Z79899 Other long term (current) drug therapy: Secondary | ICD-10-CM | POA: Insufficient documentation

## 2013-04-15 DIAGNOSIS — N9989 Other postprocedural complications and disorders of genitourinary system: Secondary | ICD-10-CM | POA: Insufficient documentation

## 2013-04-15 DIAGNOSIS — Z7901 Long term (current) use of anticoagulants: Secondary | ICD-10-CM | POA: Insufficient documentation

## 2013-04-15 HISTORY — PX: INSERTION OF MESH: SHX5868

## 2013-04-15 HISTORY — PX: INGUINAL HERNIA REPAIR: SHX194

## 2013-04-15 SURGERY — REPAIR, HERNIA, INGUINAL, ADULT
Anesthesia: General | Site: Groin | Laterality: Left | Wound class: Clean

## 2013-04-15 MED ORDER — GLYCOPYRROLATE 0.2 MG/ML IJ SOLN
INTRAMUSCULAR | Status: DC | PRN
  Administered 2013-04-15: 0.6 mg via INTRAVENOUS

## 2013-04-15 MED ORDER — CEFAZOLIN SODIUM-DEXTROSE 2-3 GM-% IV SOLR
2.0000 g | INTRAVENOUS | Status: AC
  Administered 2013-04-15: 2 g via INTRAVENOUS

## 2013-04-15 MED ORDER — PROPOFOL 10 MG/ML IV BOLUS
INTRAVENOUS | Status: DC | PRN
  Administered 2013-04-15: 160 mg via INTRAVENOUS

## 2013-04-15 MED ORDER — HYDROMORPHONE HCL PF 1 MG/ML IJ SOLN: 0.2500 mg | INTRAMUSCULAR | Status: DC | PRN

## 2013-04-15 MED ORDER — 0.9 % SODIUM CHLORIDE (POUR BTL) OPTIME
TOPICAL | Status: DC | PRN
  Administered 2013-04-15: 1000 mL

## 2013-04-15 MED ORDER — SODIUM CHLORIDE 0.9 % IJ SOLN
3.0000 mL | Freq: Two times a day (BID) | INTRAMUSCULAR | Status: DC
  Administered 2013-04-16 (×2): 3 mL via INTRAVENOUS

## 2013-04-15 MED ORDER — SODIUM CHLORIDE 0.9 % IV SOLN: INTRAVENOUS | Status: DC

## 2013-04-15 MED ORDER — ONDANSETRON HCL 4 MG/2ML IJ SOLN: 4.0000 mg | Freq: Four times a day (QID) | INTRAMUSCULAR | Status: DC | PRN

## 2013-04-15 MED ORDER — ACETAMINOPHEN 325 MG PO TABS: 650.0000 mg | ORAL_TABLET | ORAL | Status: DC | PRN

## 2013-04-15 MED ORDER — MORPHINE SULFATE 10 MG/ML IJ SOLN: 1.0000 mg | INTRAMUSCULAR | Status: DC | PRN

## 2013-04-15 MED ORDER — PROMETHAZINE HCL 25 MG/ML IJ SOLN: 6.2500 mg | INTRAMUSCULAR | Status: DC | PRN

## 2013-04-15 MED ORDER — LACTATED RINGERS IV SOLN
INTRAVENOUS | Status: DC
  Administered 2013-04-15: 1000 mL via INTRAVENOUS
  Administered 2013-04-15: 13:00:00 via INTRAVENOUS

## 2013-04-15 MED ORDER — ROCURONIUM BROMIDE 100 MG/10ML IV SOLN
INTRAVENOUS | Status: DC | PRN
  Administered 2013-04-15: 5 mg via INTRAVENOUS
  Administered 2013-04-15: 45 mg via INTRAVENOUS

## 2013-04-15 MED ORDER — PHENYLEPHRINE HCL 10 MG/ML IJ SOLN
INTRAMUSCULAR | Status: DC | PRN
  Administered 2013-04-15 (×2): 80 ug via INTRAVENOUS

## 2013-04-15 MED ORDER — TAMSULOSIN HCL 0.4 MG PO CAPS
0.4000 mg | ORAL_CAPSULE | Freq: Once | ORAL | Status: AC
  Administered 2013-04-15: 0.4 mg via ORAL
  Filled 2013-04-15: qty 1

## 2013-04-15 MED ORDER — OXYCODONE HCL 5 MG PO TABS
5.0000 mg | ORAL_TABLET | ORAL | Status: DC | PRN
  Administered 2013-04-15 (×2): 5 mg via ORAL
  Filled 2013-04-15 (×2): qty 1

## 2013-04-15 MED ORDER — SUCCINYLCHOLINE CHLORIDE 20 MG/ML IJ SOLN
INTRAMUSCULAR | Status: DC | PRN
  Administered 2013-04-15: 100 mg via INTRAVENOUS

## 2013-04-15 MED ORDER — SODIUM CHLORIDE 0.9 % IJ SOLN: 3.0000 mL | INTRAMUSCULAR | Status: DC | PRN

## 2013-04-15 MED ORDER — ONDANSETRON HCL 4 MG/2ML IJ SOLN
INTRAMUSCULAR | Status: DC | PRN
  Administered 2013-04-15: 4 mg via INTRAVENOUS

## 2013-04-15 MED ORDER — SODIUM CHLORIDE 0.9 % IV SOLN: 250.0000 mL | INTRAVENOUS | Status: DC | PRN

## 2013-04-15 MED ORDER — NEOSTIGMINE METHYLSULFATE 1 MG/ML IJ SOLN
INTRAMUSCULAR | Status: DC | PRN
  Administered 2013-04-15: 4 mg via INTRAVENOUS

## 2013-04-15 MED ORDER — BUPIVACAINE-EPINEPHRINE 0.25% -1:200000 IJ SOLN
INTRAMUSCULAR | Status: DC | PRN
  Administered 2013-04-15: 40 mL

## 2013-04-15 MED ORDER — ACETAMINOPHEN 650 MG RE SUPP
650.0000 mg | RECTAL | Status: DC | PRN
  Filled 2013-04-15: qty 1

## 2013-04-15 MED ORDER — LIDOCAINE HCL (CARDIAC) 20 MG/ML IV SOLN
INTRAVENOUS | Status: DC | PRN
  Administered 2013-04-15: 50 mg via INTRAVENOUS

## 2013-04-15 MED ORDER — HYDROCODONE-ACETAMINOPHEN 5-325 MG PO TABS
1.0000 | ORAL_TABLET | Freq: Four times a day (QID) | ORAL | Status: DC | PRN
Start: 2013-04-15 — End: 2013-04-25

## 2013-04-15 MED ORDER — OXYCODONE-ACETAMINOPHEN 5-325 MG PO TABS
1.0000 | ORAL_TABLET | ORAL | Status: DC | PRN
  Administered 2013-04-15: 2 via ORAL
  Filled 2013-04-15: qty 2

## 2013-04-15 MED ORDER — MORPHINE SULFATE 2 MG/ML IJ SOLN: 1.0000 mg | INTRAMUSCULAR | Status: DC | PRN

## 2013-04-15 MED ORDER — FENTANYL CITRATE 0.05 MG/ML IJ SOLN
INTRAMUSCULAR | Status: DC | PRN
  Administered 2013-04-15 (×3): 50 ug via INTRAVENOUS
  Administered 2013-04-15: 100 ug via INTRAVENOUS

## 2013-04-15 MED ORDER — MIDAZOLAM HCL 5 MG/5ML IJ SOLN
INTRAMUSCULAR | Status: DC | PRN
  Administered 2013-04-15 (×2): 1 mg via INTRAVENOUS

## 2013-04-15 SURGICAL SUPPLY — 32 items
BENZOIN TINCTURE PRP APPL 2/3 (GAUZE/BANDAGES/DRESSINGS) ×2 IMPLANT
BLADE SURG SZ10 CARB STEEL (BLADE) IMPLANT
CLOTH BEACON ORANGE TIMEOUT ST (SAFETY) ×2 IMPLANT
DECANTER SPIKE VIAL GLASS SM (MISCELLANEOUS) IMPLANT
DERMABOND ADVANCED (GAUZE/BANDAGES/DRESSINGS) ×1
DERMABOND ADVANCED .7 DNX12 (GAUZE/BANDAGES/DRESSINGS) ×1 IMPLANT
DRAIN PENROSE 18X1/2 LTX STRL (DRAIN) IMPLANT
DRAPE INCISE IOBAN 66X45 STRL (DRAPES) ×2 IMPLANT
DRAPE LAPAROTOMY TRNSV 102X78 (DRAPE) ×2 IMPLANT
DRAPE UTILITY XL STRL (DRAPES) ×2 IMPLANT
DRSG TEGADERM 4X4.75 (GAUZE/BANDAGES/DRESSINGS) ×2 IMPLANT
ELECT REM PT RETURN 9FT ADLT (ELECTROSURGICAL) ×2
ELECTRODE REM PT RTRN 9FT ADLT (ELECTROSURGICAL) ×1 IMPLANT
GAUZE SPONGE 4X4 16PLY XRAY LF (GAUZE/BANDAGES/DRESSINGS) ×2 IMPLANT
GLOVE BIO SURGEON STRL SZ7.5 (GLOVE) ×2 IMPLANT
GLOVE BIOGEL M STRL SZ7.5 (GLOVE) ×2 IMPLANT
GLOVE INDICATOR 8.0 STRL GRN (GLOVE) ×2 IMPLANT
GOWN STRL NON-REIN LRG LVL3 (GOWN DISPOSABLE) IMPLANT
GOWN STRL REIN XL XLG (GOWN DISPOSABLE) ×6 IMPLANT
KIT BASIN OR (CUSTOM PROCEDURE TRAY) ×2 IMPLANT
MARKER SKIN DUAL TIP RULER LAB (MISCELLANEOUS) ×2 IMPLANT
MESH ULTRAPRO 3X6 7.6X15CM (Mesh General) ×2 IMPLANT
NEEDLE HYPO 22GX1.5 SAFETY (NEEDLE) ×2 IMPLANT
PACK GENERAL/GYN (CUSTOM PROCEDURE TRAY) ×2 IMPLANT
STRIP CLOSURE SKIN 1/2X4 (GAUZE/BANDAGES/DRESSINGS) ×2 IMPLANT
SUT MNCRL AB 4-0 PS2 18 (SUTURE) ×2 IMPLANT
SUT PROLENE 2 0 CT2 30 (SUTURE) ×4 IMPLANT
SUT VIC AB 2-0 SH 27 (SUTURE) ×1
SUT VIC AB 2-0 SH 27X BRD (SUTURE) ×1 IMPLANT
SUT VIC AB 3-0 SH 18 (SUTURE) ×2 IMPLANT
SYR CONTROL 10ML LL (SYRINGE) ×2 IMPLANT
TOWEL OR 17X26 10 PK STRL BLUE (TOWEL DISPOSABLE) ×4 IMPLANT

## 2013-04-15 NOTE — Op Note (Signed)
[name] [birth year] [id number] 04/15/2013  Open Left Indirect Inguinal Hernia Repair with Mesh Procedure Note  Indications: The patient presented with a history of a left, not reducible hernia.    Pre-operative Diagnosis: left not reducible  Post-operative Diagnosis: Left indirect inguinal hernia  Surgeon: [name]   Assistants: none  Anesthesia: General endotracheal anesthesia and Local anesthesia 0.25.% bupivacaine, with epinephrine  ASA Class: 3   Surgeon: Mary Sella. Andrey Campanile, MD, FACS  Procedure Details  The patient was seen again in the Holding Room. The risks, benefits, complications, treatment options, and expected outcomes were discussed with the patient. The possibilities of reaction to medication, pulmonary aspiration, perforation of viscus, bleeding, recurrent infection, the need for additional procedures, and development of a complication requiring transfusion or further operation were discussed with the patient and/or family. The likelihood of success in repairing the hernia and returning the patient to their previous functional status is good.  There was concurrence with the proposed plan, and informed consent was obtained. The site of surgery was properly noted/marked. The patient was taken to the Operating Room, identified as [name], and the procedure verified as left inguinal hernia repair. A Time Out was held and the above information confirmed.  The patient was placed in the supine position and underwent induction of anesthesia. The lower abdomen and groin was prepped with Chloraprep and draped in the standard fashion with Ioban, and 0.25% Marcaine with epinephrine was used to anesthetize the skin over the mid-portion of the inguinal canal. An oblique incision was made. Dissection was carried down through the subcutaneous tissue with cautery to the external oblique fascia.  We opened the external oblique fascia along the direction of its fibers to the  external ring.  The spermatic cord was circumferentially dissected bluntly and retracted with a Penrose drain.  The floor of the inguinal canal was inspected and intact. The nerve along the spermatic cord was tied off.  We skeletonized the spermatic cord and separated a large cord lipoma which was ligated as well as separated a large indirect sac. Given the size of hernia sac, I sutured ligated it at its base with a 3-0 vicryl and discarded the remainder. The base easily returned to the abdominal cavity.  We used a 3 x 6 inch piece of Ultrapro mesh, which was cut into a keyhole shape.  This was secured with 2-0 Prolene, beginning at the pubic tubercle, running this along the shelving edge inferiorly. Superiorly, the mesh was secured to the internal oblique fascia with interrupted 2-0 Prolene sutures.  The tails of the mesh were sutured together behind the spermatic cord.  The mesh was tucked underneath the external oblique fascia laterally. Local was infiltrated into the shelving edge and the conjoined tendon and subcutaneous tissues.  The external oblique fascia was reapproximated with 2-0 Vicryl.  3-0 Vicryl was used to close the subcutaneous tissues and 4-0 Monocryl was used to close the skin in subcuticular fashion. Dermabond was applied to the incision.  The patient was then extubated and brought to the recovery room in stable condition.  All sponge, instrument, and needle counts were correct prior to closure and at the conclusion of the case.   Estimated Blood Loss: Minimal                 Complications: None; patient tolerated the procedure well.         Disposition: PACU - hemodynamically stable.         Condition: stable  Mary Sella. Andrey Campanile, MD, FACS General, Bariatric, & Minimally Invasive Surgery [facility] Surgery, [place]

## 2013-04-15 NOTE — Progress Notes (Signed)
Per dr Andrey Campanile  Pt for overnight stay for inability to void  Report called to [place]  Pt taken to floor by [facility]

## 2013-04-15 NOTE — Anesthesia Postprocedure Evaluation (Signed)
  Anesthesia Post-op Note  Patient: [name]  Procedure(s) Performed: Procedure(s) (LRB): LEFT HERNIA REPAIR INGUINAL INCARCERATED (Left) INSERTION OF MESH (Left)  Patient Location: PACU  Anesthesia Type: General  Level of Consciousness: awake and alert   Airway and Oxygen Therapy: Patient Spontanous Breathing  Post-op Pain: mild  Post-op Assessment: Post-op Vital signs reviewed, Patient's Cardiovascular Status Stable, Respiratory Function Stable, Patent Airway and No signs of Nausea or vomiting  Last Vitals:  Filed Vitals:   04/15/13 1426  BP: 154/58  Pulse:   Temp: 36.6 C  Resp:     Post-op Vital Signs: stable   Complications: No apparent anesthesia complications

## 2013-04-15 NOTE — H&P (Signed)
[name] is an 78 y.o. male.   Chief Complaint: here for surgery  HPI: 78 year old Caucasian male with multiple medical issues referred by Dr. Rhea Belton for evaluation of a large left inguinal hernia. The patient had an issue of anorexia and unexplained weight loss a few months ago. He lost about 38 pounds. This prompted a referral to GI for panendoscopy. He had had a CT scan which demonstrated mild sigmoid diverticulitis for which she received appropriate antibiotic treatment. He no longer has left lower quadrant abdominal pain. The colonoscopy was unable to be completed due to a left inguinal hernia. He then underwent a barium Enema to evaluate his colon and also was incomplete due to a large segment of sigmoid colon being present in the left inguinal hernia. He states he has had prior right inguinal hernia repair in the past. He states that the left groin has Had a lump for quite some time. He recently developed left groin pain. He describes it as a burning and stinging sensation. He states that it feels like a fire in his groin. He denies any diarrhea or constipation. He reports having 2 bowel movements a day. He denies any vomiting. He denies any bloating.  He does take Coumadin. His cardiologist Dr. Graciela Husbands. He denies any alcohol or tobacco use.  He does get radioactive iodine for his thyroid.  He was placed on a lovenox bridge perioperatively b/c of his history. His last lovenox dose was last night  Past Medical History  Diagnosis Date  . Atrial fibrillation     coumadin therapy;  echo 1/10: EF 65-70%, mild diast dysfxn  . Hyperlipidemia   . GERD (gastroesophageal reflux disease)   . Allergic rhinitis   . Asthma   . Anxiety   . CAD (coronary artery disease)     cath 7/09: pCFX 25%, mRCA 25%, EF 60%;  Myoview 6/11: low risk, EF 67%  . History of pneumonia   . Sebaceous cyst     scalp  . SVT (supraventricular tachycardia)     s/p RFCA 05/2010  . First degree AV block   . History of  TIAs   . Hypertension   . Stroke sev yrs ago    TIA  . Hiatal hernia   . COPD (chronic obstructive pulmonary disease)     admitted 5/11, uses oxygen 2 liters/min prn rare use  . Emphysema     Past Surgical History  Procedure Laterality Date  . Knee surgery Right 1981  . Cystectomy  2012    removed from head  . Colonoscopy N/A 02/07/2013    Procedure: COLONOSCOPY;  Surgeon: Beverley Fiedler, MD;  Location: [facility];  Service: Gastroenterology;  Laterality: N/A;  . Esophagogastroduodenoscopy N/A 02/07/2013    Procedure: ESOPHAGOGASTRODUODENOSCOPY (EGD);  Surgeon: Beverley Fiedler, MD;  Location: [facility];  Service: Gastroenterology;  Laterality: N/A;  . Back surgery  1980    lower L 4 to L 5  . Cardiac electrophysiology mapping and ablation  2008  . Inguinal hernia repair Right yrs ago  . Cardiac valve surgery      per epic, pt denies    Family History  Problem Relation Age of Onset  . Hypertension Mother   . COPD Father   . COPD Sister   . Cancer Brother     lung  . Lung cancer Mother    Social History:  reports that he quit smoking about 17 years ago. His smoking use included Cigarettes. He has a  120 pack-year smoking history. He has never used smokeless tobacco. He reports that he does not drink alcohol or use illicit drugs.  Allergies: No Known Allergies  Medications Prior to Admission  Medication Sig Dispense Refill  . albuterol (ACCUNEB) 0.63 MG/3ML nebulizer solution Take 1 ampule by nebulization every 6 (six) hours as needed for wheezing.      Marland Kitchen ALPRAZolam (XANAX) 0.5 MG tablet Take 1 tablet (0.5 mg total) by mouth 3 (three) times daily as needed for sleep.  90 tablet  0  . enoxaparin (LOVENOX) 80 MG/0.8ML injection Inject 80 mg into the skin every 12 (twelve) hours. Start 7/31 and continue until the evening of 8/3      . HYDROcodone-acetaminophen (NORCO/VICODIN) 5-325 MG per tablet Take 1 tablet by mouth every 6 (six) hours as needed for pain.  90 tablet  0  .  mometasone-formoterol (DULERA) 200-5 MCG/ACT AERO Inhale 2 puffs into the lungs 2 (two) times daily.  13 g  4  . montelukast (SINGULAIR) 10 MG tablet Take 10 mg by mouth at bedtime.      Marland Kitchen omeprazole (PRILOSEC) 20 MG capsule Take 40 mg by mouth daily.      . roflumilast (DALIRESP) 500 MCG TABS tablet Take 500 mcg by mouth at bedtime.      Marland Kitchen tiotropium (SPIRIVA) 18 MCG inhalation capsule Place 18 mcg into inhaler and inhale daily.      Marland Kitchen docusate sodium (COLACE) 100 MG capsule Take 100 mg by mouth daily as needed for constipation.      Marland Kitchen warfarin (COUMADIN) 2 MG tablet Take 2-4 mg by mouth daily. Take one tablet on mon, wed, and Friday, and take two tablets on all other days        Results for orders placed during the hospital encounter of 04/15/13 (from the past 48 hour(s))  PROTIME-INR     Status: None   Collection Time    04/15/13 10:20 AM      Result Value Range   Prothrombin Time 13.6  11.6 - 15.2 seconds   INR 1.06  0.00 - 1.49   No results found.  Review of Systems  Constitutional: Negative for fever and chills.  HENT: Negative for nosebleeds and congestion.   Respiratory: Negative for shortness of breath.   Cardiovascular: Negative for chest pain, palpitations, orthopnea and PND.  Gastrointestinal: Negative for nausea and vomiting.  Genitourinary: Positive for frequency.       Nocturia, weak stream  Neurological: Negative for dizziness, sensory change, speech change, focal weakness, seizures, loss of consciousness and weakness.  Psychiatric/Behavioral: Negative for substance abuse.    Blood pressure 158/77, pulse 96, temperature 97.6 F (36.4 C), temperature source Oral, resp. rate 18, SpO2 99.00%. Physical Exam  Vitals reviewed. Constitutional: He is oriented to person, place, and time. He appears well-developed and well-nourished. No distress.  HENT:  Head: Normocephalic and atraumatic.  Right Ear: External ear normal.  Left Ear: External ear normal.  Eyes:  Conjunctivae are normal. No scleral icterus.  Neck: Normal range of motion. Neck supple.  Cardiovascular: Normal rate.   Respiratory: Effort normal. No respiratory distress.  GI: Soft. He exhibits no distension. There is no tenderness.    Musculoskeletal: He exhibits no edema.  Neurological: He is alert and oriented to person, place, and time. He exhibits normal muscle tone.  Skin: Skin is warm and dry. No rash noted. He is not diaphoretic. No erythema.  Psychiatric: He has a normal mood and affect. His behavior is  normal. Judgment and thought content normal.     Assessment/Plan Chronic incarcerated LIH Sigmoid diverticulosis  Weight loss  COPD  Paroxysmal A Fib on coumadin  CAD   To OR for open repair of left incarcerated inguinal hernia with mesh All questions asked and answered.   Mary Sella. Andrey Campanile, MD, FACS General, Bariatric, & Minimally Invasive Surgery [facility] Surgery, [place]  [facility] M 04/15/2013, 11:55 AM

## 2013-04-15 NOTE — Transfer of Care (Signed)
Immediate Anesthesia Transfer of Care Note  Patient: [name]  Procedure(s) Performed: Procedure(s): LEFT HERNIA REPAIR INGUINAL INCARCERATED (Left) INSERTION OF MESH (Left)  Patient Location: PACU  Anesthesia Type:General  Level of Consciousness: awake  Airway & Oxygen Therapy: Patient Spontanous Breathing and Patient connected to face mask oxygen  Post-op Assessment: Report given to PACU RN and Post -op Vital signs reviewed and stable  Post vital signs: Reviewed and stable  Complications: No apparent anesthesia complications

## 2013-04-15 NOTE — Anesthesia Preprocedure Evaluation (Addendum)
Anesthesia Evaluation  Patient identified by MRN, date of birth, ID band Patient awake    Reviewed: Allergy & Precautions, H&P , NPO status , Patient's Chart, lab work & pertinent test results  Airway Mallampati: II TM Distance: >3 FB Neck ROM: Full    Dental  (+) Poor Dentition and Missing Only one tooth left.:   Pulmonary asthma , COPD COPD inhaler,  Oxygen at home, but he hardly ever uses it. breath sounds clear to auscultation  Pulmonary exam normal       Cardiovascular Exercise Tolerance: Good hypertension, Pt. on medications + CAD negative cardio ROS  + dysrhythmias Atrial Fibrillation Rhythm:Regular Rate:Normal  Clearance Dr. Graciela Husbands.   Neuro/Psych Anxiety TIA Neuromuscular disease negative psych ROS   GI/Hepatic Neg liver ROS, hiatal hernia, GERD-  Medicated,  Endo/Other  Hyperthyroidism   Renal/GU negative Renal ROS  negative genitourinary   Musculoskeletal negative musculoskeletal ROS (+)   Abdominal   Peds negative pediatric ROS (+)  Hematology negative hematology ROS (+)   Anesthesia Other Findings   Reproductive/Obstetrics negative OB ROS                        Anesthesia Physical Anesthesia Plan  ASA: III  Anesthesia Plan: General   Post-op Pain Management:    Induction: Intravenous  Airway Management Planned: Oral ETT  Additional Equipment:   Intra-op Plan:   Post-operative Plan: Extubation in OR  Informed Consent: I have reviewed the patients History and Physical, chart, labs and discussed the procedure including the risks, benefits and alternatives for the proposed anesthesia with the patient or authorized representative who has indicated his/her understanding and acceptance.   Dental advisory given  Plan Discussed with: CRNA  Anesthesia Plan Comments:         Anesthesia Quick Evaluation

## 2013-04-16 ENCOUNTER — Telehealth: Payer: Self-pay | Admitting: Pharmacist

## 2013-04-16 ENCOUNTER — Encounter (HOSPITAL_COMMUNITY): Payer: Self-pay | Admitting: General Surgery

## 2013-04-16 DIAGNOSIS — R338 Other retention of urine: Secondary | ICD-10-CM | POA: Diagnosis not present

## 2013-04-16 MED ORDER — MOMETASONE FURO-FORMOTEROL FUM 200-5 MCG/ACT IN AERO
2.0000 | INHALATION_SPRAY | Freq: Two times a day (BID) | RESPIRATORY_TRACT | Status: DC
Start: 2013-04-16 — End: 2013-04-16
  Administered 2013-04-16: 2 via RESPIRATORY_TRACT
  Filled 2013-04-16: qty 8.8

## 2013-04-16 MED ORDER — TAMSULOSIN HCL 0.4 MG PO CAPS: 0.4000 mg | ORAL_CAPSULE | Freq: Every day | ORAL | Status: DC

## 2013-04-16 MED ORDER — TAMSULOSIN HCL 0.4 MG PO CAPS
0.4000 mg | ORAL_CAPSULE | Freq: Every day | ORAL | Status: DC
  Administered 2013-04-16: 0.4 mg via ORAL
  Filled 2013-04-16: qty 1

## 2013-04-16 MED ORDER — DOCUSATE SODIUM 100 MG PO CAPS
100.0000 mg | ORAL_CAPSULE | Freq: Two times a day (BID) | ORAL | Status: DC
  Administered 2013-04-16: 100 mg via ORAL
  Filled 2013-04-16 (×2): qty 1

## 2013-04-16 MED ORDER — MONTELUKAST SODIUM 10 MG PO TABS
10.0000 mg | ORAL_TABLET | Freq: Every day | ORAL | Status: DC
  Filled 2013-04-16: qty 1

## 2013-04-16 MED ORDER — ALBUTEROL SULFATE 0.63 MG/3ML IN NEBU: 1.0000 | INHALATION_SOLUTION | Freq: Four times a day (QID) | RESPIRATORY_TRACT | Status: DC | PRN

## 2013-04-16 MED ORDER — ROFLUMILAST 500 MCG PO TABS
500.0000 ug | ORAL_TABLET | Freq: Every day | ORAL | Status: DC
  Filled 2013-04-16: qty 1

## 2013-04-16 MED ORDER — POLYETHYLENE GLYCOL 3350 17 G PO PACK
17.0000 g | PACK | Freq: Every day | ORAL | Status: DC
  Administered 2013-04-16: 17 g via ORAL
  Filled 2013-04-16: qty 1

## 2013-04-16 MED ORDER — ENOXAPARIN SODIUM 80 MG/0.8ML ~~LOC~~ SOLN
80.0000 mg | Freq: Two times a day (BID) | SUBCUTANEOUS | Status: DC
  Administered 2013-04-16: 80 mg via SUBCUTANEOUS
  Filled 2013-04-16 (×2): qty 0.8

## 2013-04-16 NOTE — Discharge Summary (Signed)
Physician Discharge Summary  [name] NWG:[id number] DOB: [birth year] DOA: 04/15/2013  PCP: Cody Baseman, MD  Admit date: 04/15/2013 Discharge date: 04/16/2013  Recommendations for Outpatient Follow-up:   Follow-up Information   Follow up with Cody Ina, MD On 05/02/2013. (4:15 pm)    Contact information:   [address] [place] [address] [phone]       Follow up with [facility] Pharmacist. Schedule an appointment as soon as possible for a visit in 3 days. (for coumadin/INR level check (the pharmacist at [facility]))       Follow up with [facility] On 04/18/2013. (8:45 AM - with Gloriajean Dell - Nurse Practioner)    Contact information:   [address] [place] [address] [phone]     Discharge Diagnoses:  1. Large left incarcerated indirect inguinal hernia 2. COPD 3. PAF 4. Chronic anticoagulation 5. Postoperative urinary retention  Surgical Procedure: Open repair of left indirect incarcerated inguinal hernia with mesh 04/15/13  Discharge Condition: good Disposition: home with foley   Diet recommendation: cardiac fit  Filed Weights   04/15/13 1742  Weight: 196 lb 3.4 oz (89 kg)    Hospital Course:  78 yo WM underwent the above mentioned procedure. In short stay, the patient did not spontaneously. He was in and out catherized and started on Flomax. He was admitted overnight for observation because of his issues with urination. He was unable to void and a foley was placed around 0200. On POD 1, he was given a choice of another voiding trial or go home early with a leg bag. He chose to do another voiding trial. He failed after a 6hr trial. Foley reinserted. He and family instructed on foley care. Appt made with [facility] for f/u. The patient was maintained on his home medications and was re-started on therapeutic lovenox for coumadin bridging.    Discharge Instructions  Discharge Orders   Future Appointments Provider Department Dept Phone   04/30/2013 11:20 AM Ileana Ladd, MD [facility] [phone]   05/02/2013 4:15 PM Cody Ina, MD [facility], [place] [phone]   Future Orders Complete By Expires     Diet - low sodium heart healthy  As directed     Discharge instructions  As directed     Comments:      Pick up prescription for Flomax at the pharmacy - it was sent electronically to pharmacy You have an appointment with [facility] on Thursday morning at 8:45 am to try to get the foley out.    Increase activity slowly  As directed         Medication List         albuterol 0.63 MG/3ML nebulizer solution  Commonly known as:  ACCUNEB  Take 1 ampule by nebulization every 6 (six) hours as needed for wheezing.     ALPRAZolam 0.5 MG tablet  Commonly known as:  XANAX  Take 1 tablet (0.5 mg total) by mouth 3 (three) times daily as needed for sleep.     DALIRESP 500 MCG Tabs tablet  Generic drug:  roflumilast  Take 500 mcg by mouth at bedtime.     docusate sodium 100 MG capsule  Commonly known as:  COLACE  Take 100 mg by mouth daily as needed for constipation.     enoxaparin 80 MG/0.8ML injection  Commonly known as:  LOVENOX  Inject 80 mg into the skin every 12 (twelve)  hours. Start 7/31 and continue until the evening of 8/3     HYDROcodone-acetaminophen 5-325 MG per tablet  Commonly known as:  NORCO/VICODIN  Take 1 tablet by mouth every 6 (six) hours as needed for pain.     HYDROcodone-acetaminophen 5-325 MG per tablet  Commonly known as:  NORCO  Take 1-2 tablets by mouth every 6 (six) hours as needed for pain. May start this this prescription when run out off home Norco     mometasone-formoterol 200-5 MCG/ACT Aero  Commonly known as:  DULERA  Inhale 2 puffs into the lungs 2 (two) times daily.     montelukast 10 MG tablet  Commonly known as:  SINGULAIR  Take 10 mg by mouth at bedtime.     omeprazole 20 MG capsule   Commonly known as:  PRILOSEC  Take 40 mg by mouth daily.     tamsulosin 0.4 MG Caps capsule  Commonly known as:  FLOMAX  Take 1 capsule (0.4 mg total) by mouth daily.     tiotropium 18 MCG inhalation capsule  Commonly known as:  SPIRIVA  Place 18 mcg into inhaler and inhale daily.     warfarin 2 MG tablet  Commonly known as:  COUMADIN  Take 2-4 mg by mouth daily. Take one tablet on mon, wed, and Friday, and take two tablets on all other days           Follow-up Information   Follow up with Cody Ina, MD On 05/02/2013. (4:15 pm)    Contact information:   [address] [place] [address] [phone]       Follow up with [facility] Pharmacist. Schedule an appointment as soon as possible for a visit in 3 days. (for coumadin/INR level check (the pharmacist at [facility]))       Follow up with [facility] On 04/18/2013. (8:45 AM - with Gloriajean Dell - Nurse Practioner)    Contact information:   [address] [place] [address] [place] [address] [phone]       The results of significant diagnostics from this hospitalization (including imaging, microbiology, ancillary and laboratory) are listed below for reference.    Significant Diagnostic Studies: No results found.  Microbiology: Recent Results (from the past 240 hour(s))  MRSA PCR SCREENING     Status: None   Collection Time    04/08/13  9:50 AM      Result Value Range Status   MRSA by PCR NEGATIVE  NEGATIVE Final   Comment:            The GeneXpert MRSA Assay (FDA     approved for NASAL specimens     only), is one component of a     comprehensive MRSA colonization     surveillance program. It is not     intended to diagnose MRSA     infection nor to guide or     monitor treatment for     MRSA infections.     BNP: BNP (last 3 results)  Recent Labs  09/30/12 1600  PROBNP 51.4   CBG:  Active Problems:   PAROXYSMAL ATRIAL FIBRILLATION   COPD   Inguinal  hernia with incarceration   Acute urinary retention   Time coordinating discharge: 20 minutes  Signed:  [name], MD [facility], [place] [phone] 04/16/2013, 4:10 PM

## 2013-04-16 NOTE — Progress Notes (Signed)
Pt is to be d/c'd home with foley catheter.  Pt was unable to void after 6 hours post foley removal this a.m.  MD ordered to reinsert foley and for pt to go home with leg bag.  Nurse showed pt and family how to switch out foley leg bag to standard drainage bag.  No concerns or questions from pt or family.  Pt's daughter stated she is comfortable with changing out drainage bags.

## 2013-04-16 NOTE — Telephone Encounter (Signed)
I received message from Dr Gaynelle Adu recommending that patient's INR be rechecked on Wednesday 8/6 or Thursday 8/7.  I called to schedule appt and spoke with [name] daughter [name].  She states that he has other appointments and cannon come in until Friday.   I will check with Almira Coaster, Dr. Nash Dimmer nurse to see if they will work him in.

## 2013-04-16 NOTE — Progress Notes (Signed)
1 Day Post-Op  Subjective: Urinary retention; i&o;had foley placed at 0300. Good urine output. No n/v. C/o of no BM in 3 days. Pain ok.   Objective: Vital signs in last 24 hours: Temp:  [97 F (36.1 C)-99.1 F (37.3 C)] 99.1 F (37.3 C) (08/05 0523) Pulse Rate:  [73-103] 85 (08/05 0523) Resp:  [18-29] 20 (08/05 0523) BP: (99-161)/(57-81) 99/57 mmHg (08/05 0523) SpO2:  [94 %-100 %] 94 % (08/05 0523) Weight:  [196 lb 3.4 oz (89 kg)] 196 lb 3.4 oz (89 kg) (08/04 1742) Last BM Date: 04/14/13  Intake/Output from previous day: 08/04 0701 - 08/05 0700 In: 2853.3 [P.O.:1000; I.V.:1853.3] Out: 1750 [Urine:1750] Intake/Output this shift:    Alert, nad cta b/l Reg Soft, nt, nd L groin - incision c/d/i. No hematoma  Lab Results:  No results found for this basename: WBC, HGB, HCT, PLT,  in the last 72 hours BMET No results found for this basename: NA, K, CL, CO2, GLUCOSE, BUN, CREATININE, CALCIUM,  in the last 72 hours PT/INR  Recent Labs  04/15/13 1020  LABPROT 13.6  INR 1.06   ABG No results found for this basename: PHART, PCO2, PO2, HCO3,  in the last 72 hours  Studies/Results: No results found.  Anti-infectives: Anti-infectives   Start     Dose/Rate Route Frequency Ordered Stop   04/15/13 1000  ceFAZolin (ANCEF) IVPB 2 g/50 mL premix     2 g 100 mL/hr over 30 Minutes Intravenous On call to O.R. 04/15/13 0952 04/15/13 1230   04/15/13 0600  ceFAZolin (ANCEF) 3 g in dextrose 5 % 50 mL IVPB  Status:  Discontinued     3 g 160 mL/hr over 30 Minutes Intravenous On call to O.R. 04/14/13 1254 04/14/13 1254      Assessment/Plan: s/p Procedure(s): LEFT HERNIA REPAIR INGUINAL INCARCERATED (Left) INSERTION OF MESH (Left)  Urinary retention - gave pt 2 options - another voiding trial or go home now with catheter. Pt wants another voiding trial. Cont flomax Resume home meds including lovenox bridge Hopefully home later today miralax for constipation  Mary Sella. Andrey Campanile, MD,  FACS General, Bariatric, & Minimally Invasive Surgery [facility], [place]   LOS: 1 day    [name] 04/16/2013

## 2013-04-16 NOTE — Care Management Note (Signed)
    Page 1 of 1   04/16/2013     10:52:20 AM   CARE MANAGEMENT NOTE 04/16/2013  Patient:  BIRUK, TROIA   Account Number:  [id number]  Date Initiated:  04/16/2013  Documentation initiated by:  Lorenda Ishihara  Subjective/Objective Assessment:   78 yo male admitted s/p left inguinal hernia repair. PTA lived at home with family.     Action/Plan:   Home when stable   Anticipated DC Date:  04/16/2013   Anticipated DC Plan:  HOME/SELF CARE      DC Planning Services  CM consult      Choice offered to / List presented to:             Status of service:  Completed, signed off Medicare Important Message given?   (If response is "NO", the following Medicare IM given date fields will be blank) Date Medicare IM given:   Date Additional Medicare IM given:    Discharge Disposition:  HOME/SELF CARE  Per UR Regulation:  Reviewed for med. necessity/level of care/duration of stay  If discussed at Long Length of Stay Meetings, dates discussed:    Comments:

## 2013-04-25 ENCOUNTER — Other Ambulatory Visit (INDEPENDENT_AMBULATORY_CARE_PROVIDER_SITE_OTHER): Payer: Self-pay | Admitting: General Surgery

## 2013-04-25 ENCOUNTER — Telehealth (INDEPENDENT_AMBULATORY_CARE_PROVIDER_SITE_OTHER): Payer: Self-pay | Admitting: *Deleted

## 2013-04-25 ENCOUNTER — Other Ambulatory Visit (INDEPENDENT_AMBULATORY_CARE_PROVIDER_SITE_OTHER): Payer: Self-pay | Admitting: *Deleted

## 2013-04-25 NOTE — Telephone Encounter (Signed)
Received pharmacy request for refill of Norco.  Per protocol Norco 5/325mg  1 tablet every 4-6 hours as needed for pain. #30 [facility] [phone]

## 2013-04-30 ENCOUNTER — Ambulatory Visit (INDEPENDENT_AMBULATORY_CARE_PROVIDER_SITE_OTHER): Payer: Medicare Other | Admitting: Family Medicine

## 2013-04-30 ENCOUNTER — Encounter: Payer: Self-pay | Admitting: Family Medicine

## 2013-04-30 VITALS — BP 159/74 | HR 71 | Temp 97.9°F | Wt 178.4 lb

## 2013-04-30 DIAGNOSIS — E785 Hyperlipidemia, unspecified: Secondary | ICD-10-CM

## 2013-04-30 DIAGNOSIS — D126 Benign neoplasm of colon, unspecified: Secondary | ICD-10-CM

## 2013-04-30 DIAGNOSIS — I440 Atrioventricular block, first degree: Secondary | ICD-10-CM

## 2013-04-30 DIAGNOSIS — I4891 Unspecified atrial fibrillation: Secondary | ICD-10-CM

## 2013-04-30 DIAGNOSIS — G459 Transient cerebral ischemic attack, unspecified: Secondary | ICD-10-CM

## 2013-04-30 DIAGNOSIS — G894 Chronic pain syndrome: Secondary | ICD-10-CM

## 2013-04-30 DIAGNOSIS — E0590 Thyrotoxicosis, unspecified without thyrotoxic crisis or storm: Secondary | ICD-10-CM

## 2013-04-30 DIAGNOSIS — F411 Generalized anxiety disorder: Secondary | ICD-10-CM

## 2013-04-30 DIAGNOSIS — K219 Gastro-esophageal reflux disease without esophagitis: Secondary | ICD-10-CM

## 2013-04-30 NOTE — Progress Notes (Signed)
Patient ID: [name], male   DOB: [birth year], 78 y.o.   MRN: [id number] SUBJECTIVE: CC: Chief Complaint  Patient presents with  . Follow-up    3 month follow up had hernia repair . wants protime checked  . Medication Refill    needs 30 day supply  on meds     HPI: Recent hernia surgery. Has been on coumadin 2 mg daily, below his usual dose.,has been well post op repair. Has appointment tomorrow.  He comes for recheck.  Patient is here for follow up of hyperlipidemia: denies Headache;denies Chest Pain;denies weakness;denies Shortness of Breath and orthopnea;denies Visual changes;denies palpitations;denies cough;denies pedal edema;denies symptoms of TIA or stroke;deniesClaudication symptoms. admits to Compliance with medications; denies Problems with medications.  Past Medical History  Diagnosis Date  . Atrial fibrillation     coumadin therapy;  echo 1/10: EF 65-70%, mild diast dysfxn  . Hyperlipidemia   . GERD (gastroesophageal reflux disease)   . Allergic rhinitis   . Asthma   . Anxiety   . CAD (coronary artery disease)     cath 7/09: pCFX 25%, mRCA 25%, EF 60%;  Myoview 6/11: low risk, EF 67%  . History of pneumonia   . Sebaceous cyst     scalp  . SVT (supraventricular tachycardia)     s/p RFCA 05/2010  . First degree AV block   . History of TIAs   . Hypertension   . Stroke sev yrs ago    TIA  . Hiatal hernia   . COPD (chronic obstructive pulmonary disease)     admitted 5/11, uses oxygen 2 liters/min prn rare use  . Emphysema    Past Surgical History  Procedure Laterality Date  . Knee surgery Right 1981  . Cystectomy  2012    removed from head  . Colonoscopy N/A 02/07/2013    Procedure: COLONOSCOPY;  Surgeon: Beverley Fiedler, MD;  Location: [facility];  Service: Gastroenterology;  Laterality: N/A;  . Esophagogastroduodenoscopy N/A 02/07/2013    Procedure: ESOPHAGOGASTRODUODENOSCOPY (EGD);  Surgeon: Beverley Fiedler, MD;  Location: [facility];  Service:  Gastroenterology;  Laterality: N/A;  . Back surgery  1980    lower L 4 to L 5  . Cardiac electrophysiology mapping and ablation  2008  . Inguinal hernia repair Right yrs ago  . Cardiac valve surgery      per epic, pt denies  . Inguinal hernia repair Left 04/15/2013    Procedure: LEFT HERNIA REPAIR INGUINAL INCARCERATED;  Surgeon: [name], MD;  Location: [facility];  Service: General;  Laterality: Left;  . Insertion of mesh Left 04/15/2013    Procedure: INSERTION OF MESH;  Surgeon: [name], MD;  Location: [facility];  Service: General;  Laterality: Left;   History   Social History  . Marital Status: Widowed    Spouse Name: N/A    Number of Children: 5  . Years of Education: N/A   Occupational History  . retired    Social History Main Topics  . Smoking status: Former Smoker -- 3.00 packs/day for 40 years    Types: Cigarettes    Quit date: 09/13/1995  . Smokeless tobacco: Never Used  . Alcohol Use: No  . Drug Use: No  . Sexual Activity: Not on file   Other Topics Concern  . Not on file   Social History Narrative  . No narrative on file   Family History  Problem Relation Age of Onset  . Hypertension Mother   .  COPD Father   . COPD Sister   . Cancer Brother     lung  . Lung cancer Mother    Current Outpatient Prescriptions on File Prior to Visit  Medication Sig Dispense Refill  . albuterol (ACCUNEB) 0.63 MG/3ML nebulizer solution Take 1 ampule by nebulization every 6 (six) hours as needed for wheezing.      Marland Kitchen ALPRAZolam (XANAX) 0.5 MG tablet Take 1 tablet (0.5 mg total) by mouth 3 (three) times daily as needed for sleep.  90 tablet  0  . HYDROcodone-acetaminophen (NORCO/VICODIN) 5-325 MG per tablet TAKE 1 TO 2 TABLETS BY MOUTH EVERY 6 HOURS AS NEEDED FOR PAIN  30 tablet  0  . mometasone-formoterol (DULERA) 200-5 MCG/ACT AERO Inhale 2 puffs into the lungs 2 (two) times daily.  13 g  4  . montelukast (SINGULAIR) 10 MG tablet Take 10 mg by mouth at bedtime.      Marland Kitchen  omeprazole (PRILOSEC) 20 MG capsule Take 40 mg by mouth daily.      . roflumilast (DALIRESP) 500 MCG TABS tablet Take 500 mcg by mouth at bedtime.      . tamsulosin (FLOMAX) 0.4 MG CAPS capsule Take 1 capsule (0.4 mg total) by mouth daily.  30 capsule  0  . tiotropium (SPIRIVA) 18 MCG inhalation capsule Place 18 mcg into inhaler and inhale daily.      Marland Kitchen warfarin (COUMADIN) 2 MG tablet Take 2-4 mg by mouth daily. Take one tablet on mon, wed, and Friday, and take two tablets on all other days      . [DISCONTINUED] albuterol-ipratropium (COMBIVENT) 18-103 MCG/ACT inhaler Inhale 1 puff into the lungs every 6 (six) hours as needed. For wheezing      . [DISCONTINUED] atorvastatin (LIPITOR) 20 MG tablet Take 20 mg by mouth daily.        . [DISCONTINUED] clonazePAM (KLONOPIN) 0.5 MG tablet Take 0.5 mg by mouth at bedtime as needed. For sleep       . [DISCONTINUED] furosemide (LASIX) 20 MG tablet Take 20 mg by mouth daily.         No current facility-administered medications on file prior to visit.   No Known Allergies Immunization History  Administered Date(s) Administered  . Influenza Split 06/13/2011  . Pneumococcal Polysaccharide 06/12/2010   Prior to Admission medications   Medication Sig Start Date End Date Taking? Authorizing Provider  albuterol (ACCUNEB) 0.63 MG/3ML nebulizer solution Take 1 ampule by nebulization every 6 (six) hours as needed for wheezing.   Yes Historical Provider, MD  ALPRAZolam Prudy Feeler) 0.5 MG tablet Take 1 tablet (0.5 mg total) by mouth 3 (three) times daily as needed for sleep. 01/28/13  Yes Ileana Ladd, MD  HYDROcodone-acetaminophen (NORCO/VICODIN) 5-325 MG per tablet TAKE 1 TO 2 TABLETS BY MOUTH EVERY 6 HOURS AS NEEDED FOR PAIN 04/25/13  Yes [name], MD  mometasone-formoterol [facility]) 200-5 MCG/ACT AERO Inhale 2 puffs into the lungs 2 (two) times daily. 01/28/13  Yes Ileana Ladd, MD  montelukast (SINGULAIR) 10 MG tablet Take 10 mg by mouth at bedtime.   Yes  Historical Provider, MD  omeprazole (PRILOSEC) 20 MG capsule Take 40 mg by mouth daily.   Yes Historical Provider, MD  roflumilast (DALIRESP) 500 MCG TABS tablet Take 500 mcg by mouth at bedtime.   Yes Historical Provider, MD  tamsulosin (FLOMAX) 0.4 MG CAPS capsule Take 1 capsule (0.4 mg total) by mouth daily. 04/16/13  Yes [name], MD  tiotropium [facility]) 18  MCG inhalation capsule Place 18 mcg into inhaler and inhale daily.   Yes Historical Provider, MD  warfarin (COUMADIN) 2 MG tablet Take 2-4 mg by mouth daily. Take one tablet on mon, wed, and Friday, and take two tablets on all other days   Yes Historical Provider, MD     ROS: As above in the HPI. All other systems are stable or negative.  OBJECTIVE: APPEARANCE:  Patient in no acute distress.The patient appeared well nourished and normally developed. Acyanotic. Waist: VITAL SIGNS:BP 159/74  Pulse 71  Temp(Src) 97.9 F (36.6 C) (Oral)  Wt 178 lb 6.4 oz (80.922 kg)  BMI 24.89 kg/m2  Tall elderly WM   SKIN: warm and  Dry without overt rashes, tattoos.  HEAD and Neck: without JVD, Head and scalp: normal Eyes:No scleral icterus. Fundi normal, eye movements normal. Ears: Auricle normal, canal normal, Tympanic membranes normal, insufflation normal. Nose: normal Throat: normal Neck & thyroid: normal  CHEST & LUNGS: Chest wall: normal Lungs: Clear  CVS: Reveals the PMI to be normally located. Regular rhythm, First and Second Heart sounds are normal,  absence of murmurs, rubs or gallops. Peripheral vasculature: Radial pulses: normal Dorsal pedis pulses: normal Posterior pulses: normal  ABDOMEN:  Appearance: normal Benign, no organomegaly, no masses, no Abdominal Aortic enlargement. No Guarding , no rebound. No Bruits. Bowel sounds: normal  RECTAL: N/A GU: left inguinal scar is clean healing well, no signs of bleeding or infection  EXTREMETIES: nonedematous.  MUSCULOSKELETAL:  Spine: normal Joints:  intact  NEUROLOGIC: oriented to time,place and person; nonfocal. .  ASSESSMENT: A-fib - Plan: POCT INR  Anxiety state, unspecified  AV BLOCK, 1ST DEGREE  Benign neoplasm of colon  Chronic pain syndrome  GERD  HYPERLIPIDEMIA  TIA  Hyperthyroidism  PLAN:  reviewed the records.   He had recent left inguinal hernia repair.  Results for orders placed in visit on 04/30/13  POCT INR      Result Value Range   INR 1.2      Needs to increase back his coumadin to presurgical dosage.  Return in about 1 week (around 05/07/2013), or anticoag clinic, recheck protime, for OV with DR Modesto Charon in 3 months.Thelma Barge P. Modesto Charon, M.D.

## 2013-05-02 ENCOUNTER — Encounter (INDEPENDENT_AMBULATORY_CARE_PROVIDER_SITE_OTHER): Payer: Medicare Other | Admitting: General Surgery

## 2013-05-09 ENCOUNTER — Encounter (INDEPENDENT_AMBULATORY_CARE_PROVIDER_SITE_OTHER): Payer: Self-pay | Admitting: General Surgery

## 2013-05-09 ENCOUNTER — Ambulatory Visit (INDEPENDENT_AMBULATORY_CARE_PROVIDER_SITE_OTHER): Payer: Medicare Other | Admitting: General Surgery

## 2013-05-09 VITALS — BP 146/82 | HR 68 | Temp 98.3°F | Resp 20 | Ht 71.5 in | Wt 171.0 lb

## 2013-05-09 DIAGNOSIS — Z09 Encounter for follow-up examination after completed treatment for conditions other than malignant neoplasm: Secondary | ICD-10-CM

## 2013-05-09 MED ORDER — HYDROCODONE-ACETAMINOPHEN 5-325 MG PO TABS: 1.0000 | ORAL_TABLET | Freq: Four times a day (QID) | ORAL | Status: DC | PRN

## 2013-05-09 NOTE — Patient Instructions (Signed)
Can continue to walk Can resume FULL activity around Sept 16 Follow up with primary doctor regarding your coumadin and your level as well as getting your colon checked (barium enema or colonoscopy)

## 2013-05-09 NOTE — Progress Notes (Signed)
Subjective:     Patient ID: [name], male   DOB: [birth year], 78 y.o.   MRN: [id number]  HPI 78 year old Caucasian male comes in for followup after undergoing open repair of a left incarcerated left inguinal hernia with mesh on August 4. He was kept overnight because of urinary retention. He was sent home with a indwelling Foley catheter. He followed up with urologist and subsequently his catheter has been removed. He denies any fever, chills, nausea, vomiting, diarrhea or constipation. He denies any trouble urinating. He is still taking some Flomax. He states that he is already resumed his Coumadin and RDS had one blood check. His level is still low. He has another blood draw scheduled for next week. He states overall he is doing well except for pain and discomfort on his left side at night. He states that it causes him pain when he tries to lay on his left side at night. He is out of pain medication. He denies any burning or stinging pain in his scrotum.  Review of Systems     Objective:   Physical Exam BP 146/82  Pulse 68  Temp(Src) 98.3 F (36.8 C) (Temporal)  Resp 20  Ht 5' 11.5" (1.816 m)  Wt 171 lb (77.565 kg)  BMI 23.52 kg/m2 Alert, no apparent distress Abdomen-soft, nontender, nondistended GU-well-healed left inguinal incision. No hematoma or seroma. Both testicles are down. No cellulitis. No evidence of hernia recurrence.    Assessment:     Status post open repair of left incarcerated indirect inguinal hernia with mesh Postoperative urinary retention-resolved     Plan:     Overall am pleased with how he is progressing. He was given a refill for Norco mainly to take at night to help him sleep. He was reminded that he should not do any heavy lifting, pushing, or pulling until September 16. Since he is still having some tenderness I will see him in 8 weeks for followup. He was reminded to followup for his Coumadin level next week. He is also encouraged to discuss the plan  for followup regarding his colon Imaging with his primary care physician Since that is how his hernia was discovered in the first place. It prevented him from doing a colonoscopy due to the incarcerated sigmoid colon in the hernia.  Mary Sella. Andrey Campanile, MD, FACS General, Bariatric, & Minimally Invasive Surgery [facility] Surgery, [place]

## 2013-05-10 ENCOUNTER — Telehealth: Payer: Self-pay | Admitting: *Deleted

## 2013-05-10 NOTE — Telephone Encounter (Signed)
Message copied by Florene Glen on Fri May 10, 2013  2:04 PM ------      Message from: Beverley Fiedler      Created: Thu May 09, 2013  3:54 PM       OV for followup in Oct 2014 (after released by Andrey Campanile, CCS) ------

## 2013-05-15 NOTE — Telephone Encounter (Signed)
Pt does not see Dr Andrey Campanile to be considered for release until 07/04/13. Mailed pt a letter with appt for 07/22/13 at 10am.

## 2013-05-20 ENCOUNTER — Other Ambulatory Visit: Payer: Self-pay | Admitting: Family Medicine

## 2013-05-20 ENCOUNTER — Telehealth (INDEPENDENT_AMBULATORY_CARE_PROVIDER_SITE_OTHER): Payer: Self-pay | Admitting: *Deleted

## 2013-05-20 ENCOUNTER — Other Ambulatory Visit (INDEPENDENT_AMBULATORY_CARE_PROVIDER_SITE_OTHER): Payer: Self-pay | Admitting: General Surgery

## 2013-05-20 NOTE — Telephone Encounter (Signed)
Received a request for a refill of patient's Norco.  Spoke to Dr. Andrey Campanile who approved 1 last refill.  Called in Norco 5/325mg  Take 1 tablet PO every 6 hours as needed for pain #20 [facility] [phone]

## 2013-05-22 NOTE — Telephone Encounter (Signed)
Patient needs to be seen. Has exceeded time since last visit. Needs to bring all medications to next appointment. Last refill. 

## 2013-05-22 NOTE — Telephone Encounter (Signed)
Last seen 04/30/13  FPW   If approved print and route to nurse

## 2013-05-23 NOTE — Telephone Encounter (Signed)
Mamie daughter aware pt needs to be seen

## 2013-05-23 NOTE — Telephone Encounter (Signed)
Patient needs to be seen. Has exceeded time since last visit. Needs to bring all medications to next appointment.   

## 2013-05-28 ENCOUNTER — Encounter: Payer: Self-pay | Admitting: *Deleted

## 2013-06-03 ENCOUNTER — Telehealth: Payer: Self-pay | Admitting: Pharmacist

## 2013-06-03 NOTE — Telephone Encounter (Signed)
Patient called - appt made for 06/06/13 at 3:10pm to recheck protime.  Patient verbalized understanding.

## 2013-06-07 ENCOUNTER — Encounter (HOSPITAL_COMMUNITY): Payer: Self-pay | Admitting: *Deleted

## 2013-06-07 ENCOUNTER — Inpatient Hospital Stay (HOSPITAL_COMMUNITY)
Admission: EM | Admit: 2013-06-07 | Discharge: 2013-06-10 | DRG: 871 | Disposition: A | Discharge status: Home / Self Care | Payer: Medicare Other | Attending: Internal Medicine | Admitting: Internal Medicine

## 2013-06-07 ENCOUNTER — Emergency Department (HOSPITAL_COMMUNITY): Payer: Medicare Other

## 2013-06-07 DIAGNOSIS — Z8673 Personal history of transient ischemic attack (TIA), and cerebral infarction without residual deficits: Secondary | ICD-10-CM

## 2013-06-07 DIAGNOSIS — Z1211 Encounter for screening for malignant neoplasm of colon: Secondary | ICD-10-CM

## 2013-06-07 DIAGNOSIS — K5732 Diverticulitis of large intestine without perforation or abscess without bleeding: Secondary | ICD-10-CM

## 2013-06-07 DIAGNOSIS — J9620 Acute and chronic respiratory failure, unspecified whether with hypoxia or hypercapnia: Secondary | ICD-10-CM | POA: Diagnosis present

## 2013-06-07 DIAGNOSIS — I2510 Atherosclerotic heart disease of native coronary artery without angina pectoris: Secondary | ICD-10-CM | POA: Diagnosis present

## 2013-06-07 DIAGNOSIS — J449 Chronic obstructive pulmonary disease, unspecified: Secondary | ICD-10-CM

## 2013-06-07 DIAGNOSIS — F411 Generalized anxiety disorder: Secondary | ICD-10-CM

## 2013-06-07 DIAGNOSIS — K5900 Constipation, unspecified: Secondary | ICD-10-CM

## 2013-06-07 DIAGNOSIS — Z87891 Personal history of nicotine dependence: Secondary | ICD-10-CM

## 2013-06-07 DIAGNOSIS — D72829 Elevated white blood cell count, unspecified: Secondary | ICD-10-CM | POA: Diagnosis present

## 2013-06-07 DIAGNOSIS — I509 Heart failure, unspecified: Secondary | ICD-10-CM | POA: Diagnosis present

## 2013-06-07 DIAGNOSIS — J439 Emphysema, unspecified: Secondary | ICD-10-CM | POA: Diagnosis present

## 2013-06-07 DIAGNOSIS — I4891 Unspecified atrial fibrillation: Secondary | ICD-10-CM | POA: Diagnosis present

## 2013-06-07 DIAGNOSIS — K219 Gastro-esophageal reflux disease without esophagitis: Secondary | ICD-10-CM | POA: Diagnosis present

## 2013-06-07 DIAGNOSIS — I471 Supraventricular tachycardia: Secondary | ICD-10-CM

## 2013-06-07 DIAGNOSIS — E0590 Thyrotoxicosis, unspecified without thyrotoxic crisis or storm: Secondary | ICD-10-CM

## 2013-06-07 DIAGNOSIS — J441 Chronic obstructive pulmonary disease with (acute) exacerbation: Secondary | ICD-10-CM | POA: Diagnosis present

## 2013-06-07 DIAGNOSIS — I10 Essential (primary) hypertension: Secondary | ICD-10-CM | POA: Diagnosis present

## 2013-06-07 DIAGNOSIS — K4030 Unilateral inguinal hernia, with obstruction, without gangrene, not specified as recurrent: Secondary | ICD-10-CM

## 2013-06-07 DIAGNOSIS — Z01810 Encounter for preprocedural cardiovascular examination: Secondary | ICD-10-CM

## 2013-06-07 DIAGNOSIS — E785 Hyperlipidemia, unspecified: Secondary | ICD-10-CM | POA: Diagnosis present

## 2013-06-07 DIAGNOSIS — G894 Chronic pain syndrome: Secondary | ICD-10-CM

## 2013-06-07 DIAGNOSIS — R05 Cough: Secondary | ICD-10-CM

## 2013-06-07 DIAGNOSIS — D689 Coagulation defect, unspecified: Secondary | ICD-10-CM

## 2013-06-07 DIAGNOSIS — D126 Benign neoplasm of colon, unspecified: Secondary | ICD-10-CM

## 2013-06-07 DIAGNOSIS — M1990 Unspecified osteoarthritis, unspecified site: Secondary | ICD-10-CM

## 2013-06-07 DIAGNOSIS — R634 Abnormal weight loss: Secondary | ICD-10-CM

## 2013-06-07 DIAGNOSIS — G459 Transient cerebral ischemic attack, unspecified: Secondary | ICD-10-CM

## 2013-06-07 DIAGNOSIS — I440 Atrioventricular block, first degree: Secondary | ICD-10-CM

## 2013-06-07 DIAGNOSIS — J189 Pneumonia, unspecified organism: Secondary | ICD-10-CM | POA: Diagnosis present

## 2013-06-07 DIAGNOSIS — A419 Sepsis, unspecified organism: Principal | ICD-10-CM | POA: Diagnosis present

## 2013-06-07 DIAGNOSIS — R338 Other retention of urine: Secondary | ICD-10-CM

## 2013-06-07 DIAGNOSIS — I5032 Chronic diastolic (congestive) heart failure: Secondary | ICD-10-CM | POA: Diagnosis present

## 2013-06-07 DIAGNOSIS — K449 Diaphragmatic hernia without obstruction or gangrene: Secondary | ICD-10-CM | POA: Diagnosis present

## 2013-06-07 DIAGNOSIS — Z7901 Long term (current) use of anticoagulants: Secondary | ICD-10-CM

## 2013-06-07 DIAGNOSIS — R5381 Other malaise: Secondary | ICD-10-CM | POA: Diagnosis present

## 2013-06-07 DIAGNOSIS — R0789 Other chest pain: Secondary | ICD-10-CM | POA: Diagnosis present

## 2013-06-07 DIAGNOSIS — R509 Fever, unspecified: Secondary | ICD-10-CM | POA: Diagnosis present

## 2013-06-07 DIAGNOSIS — R6510 Systemic inflammatory response syndrome (SIRS) of non-infectious origin without acute organ dysfunction: Secondary | ICD-10-CM | POA: Diagnosis present

## 2013-06-07 DIAGNOSIS — Z9981 Dependence on supplemental oxygen: Secondary | ICD-10-CM

## 2013-06-07 LAB — CBC WITH DIFFERENTIAL/PLATELET
Basophils Relative: 0 % (ref 0–1)
Eosinophils Relative: 0 % (ref 0–5)
HCT: 39.4 % (ref 39.0–52.0)
Hemoglobin: 13.6 g/dL (ref 13.0–17.0)
Lymphs Abs: 1.2 10*3/uL (ref 0.7–4.0)
MCH: 29.8 pg (ref 26.0–34.0)
MCHC: 34.5 g/dL (ref 30.0–36.0)
Monocytes Absolute: 1.2 10*3/uL — ABNORMAL HIGH (ref 0.1–1.0)
Monocytes Relative: 7 % (ref 3–12)
Neutro Abs: 14.5 10*3/uL — ABNORMAL HIGH (ref 1.7–7.7)
Neutrophils Relative %: 86 % — ABNORMAL HIGH (ref 43–77)
RBC: 4.57 MIL/uL (ref 4.22–5.81)

## 2013-06-07 LAB — POCT I-STAT TROPONIN I

## 2013-06-07 LAB — URINALYSIS, ROUTINE W REFLEX MICROSCOPIC
Hgb urine dipstick: NEGATIVE
Ketones, ur: NEGATIVE mg/dL
Leukocytes, UA: NEGATIVE
Nitrite: NEGATIVE
Protein, ur: NEGATIVE mg/dL
pH: 7.5 (ref 5.0–8.0)

## 2013-06-07 LAB — CBC
Hemoglobin: 12.2 g/dL — ABNORMAL LOW (ref 13.0–17.0)
MCHC: 34.2 g/dL (ref 30.0–36.0)
RBC: 4.12 MIL/uL — ABNORMAL LOW (ref 4.22–5.81)
RDW: 13.2 % (ref 11.5–15.5)

## 2013-06-07 LAB — LACTIC ACID, PLASMA: Lactic Acid, Venous: 0.9 mmol/L (ref 0.5–2.2)

## 2013-06-07 LAB — COMPREHENSIVE METABOLIC PANEL
Alkaline Phosphatase: 82 U/L (ref 39–117)
BUN: 8 mg/dL (ref 6–23)
Chloride: 98 mEq/L (ref 96–112)
GFR calc Af Amer: >90 mL/min (ref >90)
Glucose, Bld: 74 mg/dL (ref 70–99)
Potassium: 3.4 mEq/L — ABNORMAL LOW (ref 3.5–5.1)
Total Bilirubin: 0.6 mg/dL (ref 0.3–1.2)

## 2013-06-07 LAB — CG4 I-STAT (LACTIC ACID): Lactic Acid, Venous: 1.79 mmol/L (ref 0.5–2.2)

## 2013-06-07 LAB — PROTIME-INR: INR: 1.54 — ABNORMAL HIGH (ref 0.00–1.49)

## 2013-06-07 LAB — TROPONIN I: Troponin I: <0.3 ng/mL (ref <0.30)

## 2013-06-07 MED ORDER — ASPIRIN EC 81 MG PO TBEC
81.0000 mg | DELAYED_RELEASE_TABLET | Freq: Every day | ORAL | Status: DC
  Administered 2013-06-07 – 2013-06-10 (×4): 81 mg via ORAL
  Filled 2013-06-07 (×4): qty 1

## 2013-06-07 MED ORDER — ACETAMINOPHEN 650 MG RE SUPP: 650.0000 mg | Freq: Four times a day (QID) | RECTAL | Status: DC | PRN

## 2013-06-07 MED ORDER — WARFARIN SODIUM 2 MG PO TABS
2.0000 mg | ORAL_TABLET | Freq: Once | ORAL | Status: AC
  Administered 2013-06-07: 2 mg via ORAL
  Filled 2013-06-07: qty 1

## 2013-06-07 MED ORDER — ONDANSETRON HCL 4 MG/2ML IJ SOLN: 4.0000 mg | Freq: Four times a day (QID) | INTRAMUSCULAR | Status: DC | PRN

## 2013-06-07 MED ORDER — ALBUTEROL SULFATE 0.63 MG/3ML IN NEBU: 1.0000 | INHALATION_SOLUTION | Freq: Four times a day (QID) | RESPIRATORY_TRACT | Status: DC | PRN

## 2013-06-07 MED ORDER — BUDESONIDE 0.25 MG/2ML IN SUSP
0.2500 mg | Freq: Two times a day (BID) | RESPIRATORY_TRACT | Status: DC
  Administered 2013-06-08 – 2013-06-10 (×5): 0.25 mg via RESPIRATORY_TRACT
  Filled 2013-06-07 (×10): qty 2

## 2013-06-07 MED ORDER — TIOTROPIUM BROMIDE MONOHYDRATE 18 MCG IN CAPS
18.0000 ug | ORAL_CAPSULE | Freq: Every day | RESPIRATORY_TRACT | Status: DC
  Administered 2013-06-08 – 2013-06-10 (×3): 18 ug via RESPIRATORY_TRACT
  Filled 2013-06-07: qty 5

## 2013-06-07 MED ORDER — VANCOMYCIN HCL IN DEXTROSE 1-5 GM/200ML-% IV SOLN
1000.0000 mg | Freq: Two times a day (BID) | INTRAVENOUS | Status: DC
  Administered 2013-06-07 – 2013-06-09 (×5): 1000 mg via INTRAVENOUS
  Filled 2013-06-07 (×6): qty 200

## 2013-06-07 MED ORDER — PIPERACILLIN-TAZOBACTAM 3.375 G IVPB
3.3750 g | Freq: Three times a day (TID) | INTRAVENOUS | Status: DC
  Administered 2013-06-07 – 2013-06-10 (×8): 3.375 g via INTRAVENOUS
  Filled 2013-06-07 (×10): qty 50

## 2013-06-07 MED ORDER — WARFARIN - PHARMACIST DOSING INPATIENT: Freq: Every day | Status: DC

## 2013-06-07 MED ORDER — ALBUTEROL SULFATE (5 MG/ML) 0.5% IN NEBU: 2.5000 mg | INHALATION_SOLUTION | RESPIRATORY_TRACT | Status: DC | PRN

## 2013-06-07 MED ORDER — METHYLPREDNISOLONE SODIUM SUCC 40 MG IJ SOLR
40.0000 mg | Freq: Three times a day (TID) | INTRAMUSCULAR | Status: DC
  Administered 2013-06-07 – 2013-06-08 (×3): 40 mg via INTRAVENOUS
  Filled 2013-06-07 (×5): qty 1

## 2013-06-07 MED ORDER — PANTOPRAZOLE SODIUM 40 MG PO TBEC
40.0000 mg | DELAYED_RELEASE_TABLET | Freq: Two times a day (BID) | ORAL | Status: DC
  Administered 2013-06-07 – 2013-06-10 (×6): 40 mg via ORAL
  Filled 2013-06-07 (×8): qty 1

## 2013-06-07 MED ORDER — VANCOMYCIN HCL IN DEXTROSE 1-5 GM/200ML-% IV SOLN: 1000.0000 mg | Freq: Once | INTRAVENOUS | Status: DC

## 2013-06-07 MED ORDER — ONDANSETRON HCL 4 MG PO TABS: 4.0000 mg | ORAL_TABLET | Freq: Four times a day (QID) | ORAL | Status: DC | PRN

## 2013-06-07 MED ORDER — ACETAMINOPHEN 325 MG PO TABS: 650.0000 mg | ORAL_TABLET | Freq: Four times a day (QID) | ORAL | Status: DC | PRN

## 2013-06-07 MED ORDER — ONDANSETRON HCL 4 MG/2ML IJ SOLN
4.0000 mg | Freq: Once | INTRAMUSCULAR | Status: AC
  Administered 2013-06-07: 4 mg via INTRAVENOUS
  Filled 2013-06-07: qty 2

## 2013-06-07 MED ORDER — HEPARIN SODIUM (PORCINE) 5000 UNIT/ML IJ SOLN: 5000.0000 [IU] | Freq: Three times a day (TID) | INTRAMUSCULAR | Status: DC

## 2013-06-07 MED ORDER — ACETAMINOPHEN 325 MG PO TABS
650.0000 mg | ORAL_TABLET | Freq: Once | ORAL | Status: DC
  Filled 2013-06-07: qty 2

## 2013-06-07 MED ORDER — ACETAMINOPHEN 650 MG RE SUPP
650.0000 mg | Freq: Once | RECTAL | Status: AC
  Administered 2013-06-07: 650 mg via RECTAL
  Filled 2013-06-07: qty 1

## 2013-06-07 MED ORDER — PIPERACILLIN-TAZOBACTAM 3.375 G IVPB: 3.3750 g | Freq: Once | INTRAVENOUS | Status: DC

## 2013-06-07 MED ORDER — DEXTROSE 5 % IV SOLN
1.0000 g | Freq: Once | INTRAVENOUS | Status: AC
  Administered 2013-06-07: 1 g via INTRAVENOUS
  Filled 2013-06-07: qty 10

## 2013-06-07 MED ORDER — MORPHINE SULFATE 2 MG/ML IJ SOLN: 1.0000 mg | INTRAMUSCULAR | Status: DC | PRN

## 2013-06-07 MED ORDER — DEXTROSE 5 % IV SOLN
500.0000 mg | Freq: Once | INTRAVENOUS | Status: AC
  Administered 2013-06-07: 500 mg via INTRAVENOUS
  Filled 2013-06-07: qty 500

## 2013-06-07 MED ORDER — MONTELUKAST SODIUM 10 MG PO TABS
10.0000 mg | ORAL_TABLET | Freq: Every day | ORAL | Status: DC
  Administered 2013-06-07 – 2013-06-09 (×3): 10 mg via ORAL
  Filled 2013-06-07 (×4): qty 1

## 2013-06-07 MED ORDER — ENOXAPARIN SODIUM 80 MG/0.8ML ~~LOC~~ SOLN
80.0000 mg | Freq: Two times a day (BID) | SUBCUTANEOUS | Status: DC
  Administered 2013-06-07 – 2013-06-10 (×6): 80 mg via SUBCUTANEOUS
  Filled 2013-06-07 (×7): qty 0.8

## 2013-06-07 MED ORDER — SODIUM CHLORIDE 0.9 % IJ SOLN
3.0000 mL | Freq: Two times a day (BID) | INTRAMUSCULAR | Status: DC
  Administered 2013-06-07 – 2013-06-08 (×2): 3 mL via INTRAVENOUS

## 2013-06-07 NOTE — H&P (Signed)
[facility] History and Physical  [name] WUJ:[id number] DOB: [birth year] DOA: 06/07/2013  Referring physician: Felicie Morn (NP) PCP: Redmond Baseman, MD  Specialists: Dr. Sherene Sires (pulmonologist)  Chief Complaint: Fever, cough, left-sided chest discomfort, general malaise  HPI: [name] is a 78 y.o. male with past medical history significant for paroxysmal atrial fibrillation (on Coumadin), hyperlipidemia, GERD, asthma/COPD (with chronic respiratory failure oxygen dependent at home), coronary artery disease, chronic grade 1 diastolic dysfunction (ejection fraction 60-65% 2-D echo in 2012) and recent admission secondary to incarcerated hernia (August 2014); came to the hospital secondary to increase shortness of breath, cough, general malaise, fever and left-sided chest discomfort. Patient reports that he has not been feeling himself for the last 2 days or so and has been having intermittent episodes of shortness of breath. He reports that he only use the oxygen when he is at home but he has failed more short of breath, nonproductive cough, and spiking fevers for the last 2-3 days as well. In the ED patient was found without respiratory rate of 36, oxygen saturation in mid 80s on room air, temperature 103, WBCs 16,000 and a chest x-ray demonstrating left patchy opacities of/infiltrate suggesting pneumonia. [facility] has been called to admit the patient for further evaluation and treatment of healthcare associated pneumonia and SIRS   Review of Systems:  Negative except as otherwise mentioned on history of present illness.  Past Medical History  Diagnosis Date  . Atrial fibrillation     coumadin therapy;  echo 1/10: EF 65-70%, mild diast dysfxn  . Hyperlipidemia   . GERD (gastroesophageal reflux disease)   . Allergic rhinitis   . Asthma   . Anxiety   . CAD (coronary artery disease)     cath 7/09: pCFX 25%, mRCA 25%, EF 60%;  Myoview 6/11: low risk, EF 67%  .  History of pneumonia   . Sebaceous cyst     scalp  . SVT (supraventricular tachycardia)     s/p RFCA 05/2010  . First degree AV block   . History of TIAs   . Hypertension   . Stroke sev yrs ago    TIA  . Hiatal hernia   . COPD (chronic obstructive pulmonary disease)     admitted 5/11, uses oxygen 2 liters/min prn rare use  . Emphysema    Past Surgical History  Procedure Laterality Date  . Knee surgery Right 1981  . Cystectomy  2012    removed from head  . Colonoscopy N/A 02/07/2013    Procedure: COLONOSCOPY;  Surgeon: Beverley Fiedler, MD;  Location: [facility];  Service: Gastroenterology;  Laterality: N/A;  . Esophagogastroduodenoscopy N/A 02/07/2013    Procedure: ESOPHAGOGASTRODUODENOSCOPY (EGD);  Surgeon: Beverley Fiedler, MD;  Location: [facility];  Service: Gastroenterology;  Laterality: N/A;  . Back surgery  1980    lower L 4 to L 5  . Cardiac electrophysiology mapping and ablation  2008  . Inguinal hernia repair Right yrs ago  . Cardiac valve surgery      per epic, pt denies  . Inguinal hernia repair Left 04/15/2013    Procedure: LEFT HERNIA REPAIR INGUINAL INCARCERATED;  Surgeon: [name], MD;  Location: [facility];  Service: General;  Laterality: Left;  . Insertion of mesh Left 04/15/2013    Procedure: INSERTION OF MESH;  Surgeon: [name], MD;  Location: [facility];  Service: General;  Laterality: Left;   Social History:  reports that he quit smoking about 17 years  ago. His smoking use included Cigarettes. He has a 120 pack-year smoking history. He has never used smokeless tobacco. He reports that he does not drink alcohol or use illicit drugs.  No Known Allergies  Family History  Problem Relation Age of Onset  . Hypertension Mother   . COPD Father   . COPD Sister   . Cancer Brother     lung  . Lung cancer Mother     Prior to Admission medications   Medication Sig Start Date End Date Taking? Authorizing Provider  albuterol (ACCUNEB) 0.63 MG/3ML nebulizer solution  Take 1 ampule by nebulization every 6 (six) hours as needed for wheezing.   Yes Historical Provider, MD  mometasone-formoterol (DULERA) 200-5 MCG/ACT AERO Inhale 2 puffs into the lungs 2 (two) times daily. 01/28/13  Yes Ileana Ladd, MD  montelukast (SINGULAIR) 10 MG tablet Take 10 mg by mouth at bedtime.   Yes Historical Provider, MD  omeprazole (PRILOSEC) 20 MG capsule Take 40 mg by mouth daily.   Yes Historical Provider, MD  tiotropium (SPIRIVA) 18 MCG inhalation capsule Place 18 mcg into inhaler and inhale daily.   Yes Historical Provider, MD  warfarin (COUMADIN) 2 MG tablet Take 2-4 mg by mouth daily. Take one tablet on mon, wed, and Friday, and take two tablets on all other days   Yes Historical Provider, MD   Physical Exam: Filed Vitals:   06/07/13 2018  BP: 109/64  Pulse: 99  Temp: 99.3 F (37.4 C)  Resp: 36     General:  Mild acute distress and unable to speak in full sentences, patient is not using accessory muscles for his respiratory rate goes up to low 30s with him on exertion. Warm to touch. Cooperative to examination. Alert awake and oriented x4  Eyes: PERRLA, extraocular muscles intact, no 18 use, no nystagmus  ENT: Moist mucous membranes, poor dentition, no erythema or exudates inside his mouth, no drainage out of his ears or nostrils  Neck: Supple, no JVD, no bruits  Cardiovascular: Tachycardic, S1 and S2 appreciate on exam, no rubs, no gallops  Respiratory: Poor air movement, a slight wheezing and scattered rhonchi (by basilar, left > right)  Abdomen: Soft, nontender, nondistended, positive bowel sounds, no guarding  Skin: No rash, no petechiae, old bruises appreciated on his arms bilaterally  Musculoskeletal: Trace edema bilaterally, slight swelling of his knees bilaterally (chronic and secondary to ulcer arthritis according to patient), no warm sensation or erythema of his joints appreciated on exam  Psychiatric: No suicidal ideation, no hallucinations; mood  stable.  Neurologic: Alert, awake and oriented x4; cranial nerve grossly intact, follow commands properly, move 4 limbs without difficulty, muscle strength 4/5 bilaterally symmetrically, no focal motor or sensory deficit appreciated. Due to shortness of breath gait was not assessed.  Labs on Admission:  Basic Metabolic Panel:  Recent Labs Lab 06/07/13 1720  NA 139  K 3.4*  CL 98  CO2 29  GLUCOSE 74  BUN 8  CREATININE 0.79  CALCIUM 9.7   Liver Function Tests:  Recent Labs Lab 06/07/13 1720  AST 19  ALT 10  ALKPHOS 82  BILITOT 0.6  PROT 7.2  ALBUMIN 3.9   CBC:  Recent Labs Lab 06/07/13 1720  WBC 16.9*  NEUTROABS 14.5*  HGB 13.6  HCT 39.4  MCV 86.2  PLT 238   BNP (last 3 results)  Recent Labs  09/30/12 1600  PROBNP 51.4   Radiological Exams on Admission: Dg Chest 2 View  06/07/2013   *  RADIOLOGY REPORT*  Clinical Data: Fever, shortness of breath and cough  CHEST - 2 VIEW  Comparison: 01/01/2013 and prior chest radiographs dating back to 2010  Findings: The cardiomediastinal silhouette is unremarkable. Patchy left basilar opacity may represent pneumonia. There is no evidence of pleural effusion, mass, pneumothorax or pulmonary edema. COPD/emphysema again identified. No acute bony abnormalities are identified.  IMPRESSION: Patchy left basilar opacity which may represent pneumonia. Radiographic follow up to resolution is recommended.  COPD/emphysema.   Original Report Authenticated By: Harmon Pier, M.D.    EKG:  Date: 06/07/2013 1656  Rate: 107  Rhythm: sinus tachycardia  QRS Axis: normal  Intervals: PR prolonged  ST/T Wave abnormalities: indeterminate  Conduction Disutrbances:first-degree A-V block  Narrative Interpretation: No acute ischemic changes appreciated. Son artifact making impossible accurate diagnosis interpretation. Old EKG Reviewed: changes noted   Assessment/Plan 1-SIRS (systemic inflammatory response syndrome) due to 2 healthcare associated  pneumonia: Most likely secondary to healthcare associated pneumonia. -Patient will be admitted to telemetry bed, -Will star IV broad-spectrum antibiotics and follow blood cultures, urine culture, Legionella antigen, strep pneumoniae antigen in urine and follow clinical response. -Will provide as needed antipyretics and supportive care. -Will check lactic acid  2-acute on chronic respiratory failure secondary to COPD exacerbation: Most likely due to healthcare assess her pneumonia. -Will star IV Solu-Medrol, IV antibiotics, nebulizer treatment and Pulmicort. -Will continue home dose spiriva -Incentive spirometer and oxygen supplementation.  3-HYPERLIPIDEMIA: Patient currently not using any statins. Will check a lipid panel and he will follow heart healthy diet.  4-PAROXYSMAL ATRIAL FIBRILLATION: Currently sinus. Patient will be admitted to telemetry bed. Will continue the use of Coumadin per pharmacy.    5-COPD: With exacerbation. Will continue nebulizer treatment, especially the also montelukast; as patient had history of intrinsic asthma as well.  6-GERD: Continue PPI.  7-Fever: Secondary to #1. Treatment as above. When necessary antipyretics will be provided  8-Leukocytosis, unspecified: Secondary to #1. Patient will be receiving IV antibiotics and will follow blood and urine cultures.  7-Chronic diastolic heart failure: Appears compensated at this moment. No JVD appreciated on exam.  -Heart healthy diet -Daily weights and strict intake and output -Patient will have cardiac enzymes and 2-D echo check given ongoing chest discomfort and risk factors -Will be on telemetry bed.  8-chest pain: Most likely secondary to pneumonia. He agrees factors will check cardiac enzymes, 2-D echo and will watching on telemetry. Patient will be placed on baby aspirin.. -Will increase his PPI to twice a day.  DVT prophylaxis: Coumadin per pharmacy with Lovenox bridge given low INR.  Code Status: Full  code Family Communication: Granddaughter at bedside Disposition Plan: Length of stay more than two midnight, telemetry, inpatient status  Time spent: 55 minutes  Pamila Mendibles [facility] Pager [phone]  If 7PM-7AM, please contact night-coverage [email or web address] Password [facility] 06/07/2013, 9:47 PM

## 2013-06-07 NOTE — ED Notes (Signed)
Pt reports left sided chest pain, is intermittent, pain sharp/heavy. States he has had fever, has not taken anything to reduce fever. Swelling to bilateral knees, states he receives cortisone shots, swelling has gotten worse

## 2013-06-07 NOTE — ED Notes (Signed)
Hospitalist at bedside 

## 2013-06-07 NOTE — Progress Notes (Signed)
ANTICOAGULATION CONSULT NOTE - Initial Consult  Pharmacy Consult for Warfarin, Lovenox Indication: atrial fibrillation  No Known Allergies  Patient Measurements: Height: 5' 11.5" (181.6 cm) Weight: 174 lb 13.2 oz (79.3 kg) IBW/kg (Calculated) : 76.45 Heparin Dosing Weight:   Vital Signs: Temp: 99.3 F (37.4 C) (09/26 2018) Temp src: Oral (09/26 2018) BP: 109/64 mmHg (09/26 2018) Pulse Rate: 99 (09/26 2018)  Labs:  Recent Labs  06/07/13 1720 06/07/13 1927  HGB 13.6  --   HCT 39.4  --   PLT 238  --   LABPROT  --  18.1*  INR  --  1.54*  CREATININE 0.79  --     Estimated Creatinine Clearance: 85 ml/min (by C-G formula based on Cr of 0.79).   Medical History: Past Medical History  Diagnosis Date  . Atrial fibrillation     coumadin therapy;  echo 1/10: EF 65-70%, mild diast dysfxn  . Hyperlipidemia   . GERD (gastroesophageal reflux disease)   . Allergic rhinitis   . Asthma   . Anxiety   . CAD (coronary artery disease)     cath 7/09: pCFX 25%, mRCA 25%, EF 60%;  Myoview 6/11: low risk, EF 67%  . History of pneumonia   . Sebaceous cyst     scalp  . SVT (supraventricular tachycardia)     s/p RFCA 05/2010  . First degree AV block   . History of TIAs   . Hypertension   . Stroke sev yrs ago    TIA  . Hiatal hernia   . COPD (chronic obstructive pulmonary disease)     admitted 5/11, uses oxygen 2 liters/min prn rare use  . Emphysema     Medications:  Scheduled:  . aspirin EC  81 mg Oral Daily  . budesonide (PULMICORT) nebulizer solution  0.25 mg Nebulization BID  . methylPREDNISolone (SOLU-MEDROL) injection  40 mg Intravenous Q8H  . montelukast  10 mg Oral QHS  . pantoprazole  40 mg Oral BID  . [START ON 06/08/2013] piperacillin-tazobactam (ZOSYN)  IV  3.375 g Intravenous Q8H  . sodium chloride  3 mL Intravenous Q12H  . tiotropium  18 mcg Inhalation Daily  . [START ON 06/08/2013] vancomycin  1,000 mg Intravenous Q12H   Infusions:    Assessment: 44 yoM  admitted on 9/26 with r/o HCAP.  PMH includes afib on chronic warfarin PTA.  Pharmacy is consulted to dose warfarin inpatient and start Lovenox bridge while INR < 2.  PTA warfarin dose: 2mg  MWF and 4mg  on TuThSaSu.  Last dose 9/26  Admission INR is subtherapeutic at 1.54  SCr: 0.79   CBC: Hgb 13.6 and Plt 238   Goal of Therapy:  INR 2-3 Anti-Xa level 0.6-1.2 units/ml 4hrs after LMWH dose given Monitor platelets by anticoagulation protocol: Yes   Plan:   Warfarin - give additional 2mg  dose tonight for low INR.  Total dose today = 4mg   Lovenox 80mg  SQ BID, d/c when INR > 2  Daily INR  Pharmacy to f/u renal function and CBC.  Lynann Beaver PharmD, BCPS Pager [phone] 06/07/2013 10:20 PM

## 2013-06-07 NOTE — ED Provider Notes (Signed)
  This was a shared visit with a mid-level provided (NP or PA).  Throughout the patient's course I was available for consultation/collaboration.  I saw the ECG (if appropriate), relevant labs and studies - I agree with the interpretation.  On my exam the patient appears uncomfortable.  He had fever, and given extra evidence of pneumonia, required admission for further evaluation and management.      Gerhard Munch, MD 06/07/13 2348

## 2013-06-07 NOTE — ED Provider Notes (Signed)
CSN: [id number]     Arrival date & time 06/07/13  1620 History   First MD Initiated Contact with Patient 06/07/13 1643     Chief Complaint  Patient presents with  . Chest Pain  . Fever   (Consider location/radiation/quality/duration/timing/severity/associated sxs/prior Treatment) Patient is a 78 y.o. male presenting with chest pain and fever. The history is provided by the patient. No language interpreter was used.  Chest Pain Pain location:  L chest Pain quality: dull   Pain radiates to:  Does not radiate Pain radiates to the back: no   Pain severity:  Moderate Onset quality:  Sudden Duration:  2 hours Timing:  Intermittent Chronicity:  New Context: at rest   Relieved by:  None tried Ineffective treatments:  None tried Associated symptoms: fever and shortness of breath   Fever:    Duration:  2 hours   Max temp PTA (F):  Not taken   Temp source:  Subjective Risk factors: coronary artery disease and male sex   Fever Associated symptoms: chest pain and chills    Patient drivng with his family member this afternoon, developed sudden onset of cold chills, left anterior chest pain, shortness of breath.  Patient with chronic bilateral lower extremity pain and swelling, seems to have worsened today..  COPD, CAD history.  Past Medical History  Diagnosis Date  . Atrial fibrillation     coumadin therapy;  echo 1/10: EF 65-70%, mild diast dysfxn  . Hyperlipidemia   . GERD (gastroesophageal reflux disease)   . Allergic rhinitis   . Asthma   . Anxiety   . CAD (coronary artery disease)     cath 7/09: pCFX 25%, mRCA 25%, EF 60%;  Myoview 6/11: low risk, EF 67%  . History of pneumonia   . Sebaceous cyst     scalp  . SVT (supraventricular tachycardia)     s/p RFCA 05/2010  . First degree AV block   . History of TIAs   . Hypertension   . Stroke sev yrs ago    TIA  . Hiatal hernia   . COPD (chronic obstructive pulmonary disease)     admitted 5/11, uses oxygen 2 liters/min prn rare  use  . Emphysema    Past Surgical History  Procedure Laterality Date  . Knee surgery Right 1981  . Cystectomy  2012    removed from head  . Colonoscopy N/A 02/07/2013    Procedure: COLONOSCOPY;  Surgeon: Beverley Fiedler, MD;  Location: [facility];  Service: Gastroenterology;  Laterality: N/A;  . Esophagogastroduodenoscopy N/A 02/07/2013    Procedure: ESOPHAGOGASTRODUODENOSCOPY (EGD);  Surgeon: Beverley Fiedler, MD;  Location: [facility];  Service: Gastroenterology;  Laterality: N/A;  . Back surgery  1980    lower L 4 to L 5  . Cardiac electrophysiology mapping and ablation  2008  . Inguinal hernia repair Right yrs ago  . Cardiac valve surgery      per epic, pt denies  . Inguinal hernia repair Left 04/15/2013    Procedure: LEFT HERNIA REPAIR INGUINAL INCARCERATED;  Surgeon: [name], MD;  Location: [facility];  Service: General;  Laterality: Left;  . Insertion of mesh Left 04/15/2013    Procedure: INSERTION OF MESH;  Surgeon: [name], MD;  Location: [facility];  Service: General;  Laterality: Left;   Family History  Problem Relation Age of Onset  . Hypertension Mother   . COPD Father   . COPD Sister   . Cancer Brother  lung  . Lung cancer Mother    History  Substance Use Topics  . Smoking status: Former Smoker -- 3.00 packs/day for 40 years    Types: Cigarettes    Quit date: 09/13/1995  . Smokeless tobacco: Never Used  . Alcohol Use: No    Review of Systems  Constitutional: Positive for fever and chills.  Respiratory: Positive for shortness of breath.   Cardiovascular: Positive for chest pain.  All other systems reviewed and are negative.    Allergies  Review of patient's allergies indicates no known allergies.  Home Medications   Current Outpatient Rx  Name  Route  Sig  Dispense  Refill  . albuterol (ACCUNEB) 0.63 MG/3ML nebulizer solution   Nebulization   Take 1 ampule by nebulization every 6 (six) hours as needed for wheezing.         .  mometasone-formoterol (DULERA) 200-5 MCG/ACT AERO   Inhalation   Inhale 2 puffs into the lungs 2 (two) times daily.   13 g   4   . montelukast (SINGULAIR) 10 MG tablet   Oral   Take 10 mg by mouth at bedtime.         Marland Kitchen omeprazole (PRILOSEC) 20 MG capsule   Oral   Take 40 mg by mouth daily.         Marland Kitchen tiotropium (SPIRIVA) 18 MCG inhalation capsule   Inhalation   Place 18 mcg into inhaler and inhale daily.         Marland Kitchen warfarin (COUMADIN) 2 MG tablet   Oral   Take 2-4 mg by mouth daily. Take one tablet on mon, wed, and Friday, and take two tablets on all other days          BP 154/105  Pulse 109  Temp(Src) 99.6 F (37.6 C) (Oral)  Resp 20  SpO2 91% Physical Exam  Nursing note and vitals reviewed. Constitutional: He is oriented to person, place, and time. He appears well-developed and well-nourished.  HENT:  Head: Normocephalic and atraumatic.  Eyes: Pupils are equal, round, and reactive to light.  Neck: Normal range of motion.  Cardiovascular: Normal rate and normal heart sounds.   Pulmonary/Chest: He is in respiratory distress. He has rales. He exhibits no tenderness.  Abdominal: Soft. Bowel sounds are normal.  Musculoskeletal: He exhibits tenderness. He exhibits no edema.  Lymphadenopathy:    He has no cervical adenopathy.  Neurological: He is alert and oriented to person, place, and time.  Skin: Skin is warm and dry.  Psychiatric: He has a normal mood and affect. His behavior is normal. Judgment and thought content normal.    ED Course  Procedures (including critical care time)   Date: 06/07/2013 1656  Rate: 107  Rhythm: sinus tachycardia  QRS Axis: normal  Intervals: PR prolonged  ST/T Wave abnormalities: indeterminate  Conduction Disutrbances:first-degree A-V block   Narrative Interpretation: Interpretation degraded by artifact, but appears unchanged from prior EKG except for accelerated rate.  Old EKG Reviewed: changes noted  Labs Review Labs  Reviewed  CBC WITH DIFFERENTIAL  COMPREHENSIVE METABOLIC PANEL  URINALYSIS, ROUTINE W REFLEX MICROSCOPIC   Imaging Review No results found. Lab and radiology results reviewed.  WBC 16.9 with 86% neutrophils.  CXR suspicious for pneumonia .  Patient with tachypnea, mild tachycardia and hypoxia.  Poor initial EKG due to artifact affecting baseline, but appears to be sinus rhythm, no ST changes.  Will repeat EKG once patient is no longer shivering.  Admitted to hospitalist service, telemetry.  Request to treat as HCAP, as patient apparently had an overnight stay with recent hernia repair. MDM  Health-care associated pneumonia.     Jimmye Norman, NP 06/07/13 [phone]

## 2013-06-07 NOTE — Progress Notes (Signed)
ANTIBIOTIC CONSULT NOTE - INITIAL  Pharmacy Consult for Vancomycin, Zosyn Indication: pneumonia  No Known Allergies  Patient Measurements: Height: 5' 11.5" (181.6 cm) Weight: 174 lb 13.2 oz (79.3 kg) IBW/kg (Calculated) : 76.45  Vital Signs: Temp: 99.3 F (37.4 C) (09/26 2018) Temp src: Oral (09/26 2018) BP: 109/64 mmHg (09/26 2018) Pulse Rate: 99 (09/26 2018)  Labs:  Recent Labs  06/07/13 1720  WBC 16.9*  HGB 13.6  PLT 238  CREATININE 0.79   Estimated Creatinine Clearance: 85 ml/min (by C-G formula based on Cr of 0.79).  Microbiology: No results found for this or any previous visit (from the past 720 hour(s)).  Medical History: Past Medical History  Diagnosis Date  . Atrial fibrillation     coumadin therapy;  echo 1/10: EF 65-70%, mild diast dysfxn  . Hyperlipidemia   . GERD (gastroesophageal reflux disease)   . Allergic rhinitis   . Asthma   . Anxiety   . CAD (coronary artery disease)     cath 7/09: pCFX 25%, mRCA 25%, EF 60%;  Myoview 6/11: low risk, EF 67%  . History of pneumonia   . Sebaceous cyst     scalp  . SVT (supraventricular tachycardia)     s/p RFCA 05/2010  . First degree AV block   . History of TIAs   . Hypertension   . Stroke sev yrs ago    TIA  . Hiatal hernia   . COPD (chronic obstructive pulmonary disease)     admitted 5/11, uses oxygen 2 liters/min prn rare use  . Emphysema     Medications:  9/26 >> Vanc >> 9/26 >> Zosyn>>   Assessment: 69 yoM admitted on 9/26 with r/o HCAP.   SCr: 0.79, CrCl ~ 85 ml/min  Tm 103.1  WBC: 16.9  Cultures pending   Goal of Therapy:  Vancomycin trough level 15-20 mcg/ml Appropriate abx dosing, eradication of infection.   Plan:   Zosyn 3.375g IV Q8H infused over 4hrs.  Vancomycin 1g IV q12h.  Measure Vanc trough at steady state.  Follow up renal fxn and culture results.    Lynann Beaver PharmD, BCPS Pager [phone] 06/07/2013 10:19 PM

## 2013-06-08 LAB — BASIC METABOLIC PANEL
BUN: 10 mg/dL (ref 6–23)
CO2: 26 mEq/L (ref 19–32)
Calcium: 9 mg/dL (ref 8.4–10.5)
Chloride: 98 mEq/L (ref 96–112)
Creatinine, Ser: 0.79 mg/dL (ref 0.50–1.35)
GFR calc Af Amer: >90 mL/min (ref >90)
GFR calc non Af Amer: 85 mL/min — ABNORMAL LOW (ref >90)
Glucose, Bld: 111 mg/dL — ABNORMAL HIGH (ref 70–99)
Potassium: 3.7 mEq/L (ref 3.5–5.1)
Sodium: 136 mEq/L (ref 135–145)

## 2013-06-08 LAB — CBC
Hemoglobin: 12.6 g/dL — ABNORMAL LOW (ref 13.0–17.0)
MCH: 28.6 pg (ref 26.0–34.0)
MCHC: 32.5 g/dL (ref 30.0–36.0)
Platelets: 197 10*3/uL (ref 150–400)
RBC: 4.41 MIL/uL (ref 4.22–5.81)
RDW: 13.3 % (ref 11.5–15.5)
WBC: 23 10*3/uL — ABNORMAL HIGH (ref 4.0–10.5)

## 2013-06-08 LAB — STREP PNEUMONIAE URINARY ANTIGEN: Strep Pneumo Urinary Antigen: NEGATIVE

## 2013-06-08 LAB — LIPID PANEL
Cholesterol: 147 mg/dL (ref 0–200)
HDL: 52 mg/dL (ref >39)
LDL Cholesterol: 87 mg/dL (ref 0–99)
Total CHOL/HDL Ratio: 2.8 RATIO
Triglycerides: 41 mg/dL (ref <150)
VLDL: 8 mg/dL (ref 0–40)

## 2013-06-08 LAB — TROPONIN I
Troponin I: <0.3 ng/mL (ref <0.30)
Troponin I: <0.3 ng/mL (ref <0.30)

## 2013-06-08 LAB — PROTIME-INR: INR: 1.59 — ABNORMAL HIGH (ref 0.00–1.49)

## 2013-06-08 MED ORDER — METHYLPREDNISOLONE SODIUM SUCC 40 MG IJ SOLR
40.0000 mg | Freq: Every day | INTRAMUSCULAR | Status: DC
  Administered 2013-06-09: 10:00:00 40 mg via INTRAVENOUS
  Filled 2013-06-08: qty 1

## 2013-06-08 MED ORDER — WARFARIN SODIUM 4 MG PO TABS
4.0000 mg | ORAL_TABLET | Freq: Once | ORAL | Status: AC
  Administered 2013-06-08: 4 mg via ORAL
  Filled 2013-06-08: qty 1

## 2013-06-08 MED ORDER — ALPRAZOLAM 0.25 MG PO TABS
0.2500 mg | ORAL_TABLET | Freq: Every evening | ORAL | Status: DC | PRN
  Administered 2013-06-09: 22:00:00 0.25 mg via ORAL
  Filled 2013-06-08: qty 1

## 2013-06-08 NOTE — Progress Notes (Signed)
ANTICOAGULATION CONSULT NOTE - Follow up  Pharmacy Consult for Warfarin, Lovenox Indication: atrial fibrillation  No Known Allergies  Patient Measurements: Height: 5' 11.5" (181.6 cm) Weight: 175 lb 7.8 oz (79.6 kg) IBW/kg (Calculated) : 76.45 Heparin Dosing Weight:   Vital Signs: Temp: 98 F (36.7 C) (09/27 0524) Temp src: Oral (09/27 0524) BP: 108/55 mmHg (09/27 0524) Pulse Rate: 64 (09/27 0524)  Labs:  Recent Labs  06/07/13 1720 06/07/13 1927 06/07/13 2249 06/08/13 0415  HGB 13.6  --  12.2* 12.6*  HCT 39.4  --  35.7* 38.8*  PLT 238  --  193 197  LABPROT  --  18.1*  --  18.5*  INR  --  1.54*  --  1.59*  CREATININE 0.79  --   --  0.79  TROPONINI  --   --  <0.30 <0.30    Estimated Creatinine Clearance: 85 ml/min (by C-G formula based on Cr of 0.79).  Assessment: 12 yoM admitted on 9/26 with r/o HCAP.  PMH includes afib on chronic warfarin PTA.  Pharmacy is consulted to dose warfarin inpatient and start Lovenox bridge while INR < 2.  PTA warfarin dose: 2mg  MWF and 4mg  on TuThSaSu.  Last dose 9/26  INR still subtherapeutic but rose slightly after total of 4mg  yesterday.  SCr wnl, CrCl >30.   CBC wnl. No bleeding reported/documented.  Goal of Therapy:  INR 2-3 Anti-Xa level 0.6-1.2 units/ml 4hrs after LMWH dose given Monitor platelets by anticoagulation protocol: Yes   Plan:   Give Coumadin 4mg  at 1800.   Cont Lovenox 80mg  SQ BID, d/c when INR > 2.  Daily INR.  F/u renal function and CBC.  Charolotte Eke, PharmD, pager [phone]. 06/08/2013,8:49 AM.

## 2013-06-08 NOTE — Progress Notes (Signed)
*  PRELIMINARY RESULTS* Echocardiogram 2D Echocardiogram has been performed.  [name] M 06/08/2013, 11:09 AM

## 2013-06-08 NOTE — Progress Notes (Signed)
[facility] PROGRESS NOTE  [name] ION:[id number] DOB: 04/05/1935 DOA: 06/07/2013 PCP: Redmond Baseman, MD  Assessment/Plan:  Acute on Chronic Respiratory Failure -2/2 HCAP and COPD exacerbation. -See below for details.  HCAP/SIRS -Continue vanc/zosyn. -Cx data pending. -Afebrile.  COPD with Acute Exacerbation -Improving. -No wheezing. -Will start steroid titration today.  A Fib -Rate controlled. -Continue coumadin dosing.  Chronic Diastolic CHF -Well compensated.  Code Status: Full Code Family Communication: Patient only  Disposition Plan: Home when medically stable.   Consultants:  None   Antibiotics:  Vancomycin 9/26-->  Zosyn 9/26-->   Subjective: Feels better, less SOB, still with productive cough.  Objective: Filed Vitals:   06/08/13 0500 06/08/13 0524 06/08/13 0846 06/08/13 1319  BP:  108/55  110/59  Pulse:  64  60  Temp:  98 F (36.7 C)  97.7 F (36.5 C)  TempSrc:  Oral  Oral  Resp:  28  24  Height:      Weight: 79.6 kg (175 lb 7.8 oz)     SpO2:  100% 98% 98%    Intake/Output Summary (Last 24 hours) at 06/08/13 1558 Last data filed at 06/08/13 1319  Gross per 24 hour  Intake    543 ml  Output   1300 ml  Net   -757 ml   Filed Weights   06/07/13 2018 06/08/13 0500  Weight: 79.3 kg (174 lb 13.2 oz) 79.6 kg (175 lb 7.8 oz)    Exam:   General:  AA Ox3  Cardiovascular: RRR  Respiratory: Bilateral ronchi, no wheezes  Abdomen: S/NT/ND/+BS  Extremities: no C/C/E   Neurologic:  Grossly intact and non-focal  Data Reviewed: Basic Metabolic Panel:  Recent Labs Lab 06/07/13 1720 06/08/13 0415  NA 139 136  K 3.4* 3.7  CL 98 98  CO2 29 26  GLUCOSE 74 111*  BUN 8 10  CREATININE 0.79 0.79  CALCIUM 9.7 9.0   Liver Function Tests:  Recent Labs Lab 06/07/13 1720  AST 19  ALT 10  ALKPHOS 82  BILITOT 0.6  PROT 7.2  ALBUMIN 3.9   No results found for this basename: LIPASE, AMYLASE,  in the last  168 hours No results found for this basename: AMMONIA,  in the last 168 hours CBC:  Recent Labs Lab 06/07/13 1720 06/07/13 2249 06/08/13 0415  WBC 16.9* 18.2* 23.0*  NEUTROABS 14.5*  --   --   HGB 13.6 12.2* 12.6*  HCT 39.4 35.7* 38.8*  MCV 86.2 86.7 88.0  PLT 238 193 197   Cardiac Enzymes:  Recent Labs Lab 06/07/13 2249 06/08/13 0415 06/08/13 0911  TROPONINI <0.30 <0.30 <0.30   BNP (last 3 results)  Recent Labs  09/30/12 1600  PROBNP 51.4   CBG: No results found for this basename: GLUCAP,  in the last 168 hours  No results found for this or any previous visit (from the past 240 hour(s)).   Studies: Dg Chest 2 View  06/07/2013   *RADIOLOGY REPORT*  Clinical Data: Fever, shortness of breath and cough  CHEST - 2 VIEW  Comparison: 01/01/2013 and prior chest radiographs dating back to 2010  Findings: The cardiomediastinal silhouette is unremarkable. Patchy left basilar opacity may represent pneumonia. There is no evidence of pleural effusion, mass, pneumothorax or pulmonary edema. COPD/emphysema again identified. No acute bony abnormalities are identified.  IMPRESSION: Patchy left basilar opacity which may represent pneumonia. Radiographic follow up to resolution is recommended.  COPD/emphysema.   Original Report Authenticated By: Harmon Pier, M.D.  Scheduled Meds: . aspirin EC  81 mg Oral Daily  . budesonide (PULMICORT) nebulizer solution  0.25 mg Nebulization BID  . enoxaparin (LOVENOX) injection  80 mg Subcutaneous BID  . methylPREDNISolone (SOLU-MEDROL) injection  40 mg Intravenous Q8H  . montelukast  10 mg Oral QHS  . pantoprazole  40 mg Oral BID  . piperacillin-tazobactam (ZOSYN)  IV  3.375 g Intravenous Q8H  . sodium chloride  3 mL Intravenous Q12H  . tiotropium  18 mcg Inhalation Daily  . vancomycin  1,000 mg Intravenous BID  . warfarin  4 mg Oral ONCE-1800  . Warfarin - Pharmacist Dosing Inpatient   Does not apply q1800   Continuous Infusions:    Principal Problem:   SIRS (systemic inflammatory response syndrome) Active Problems:   Healthcare-associated pneumonia   HYPERLIPIDEMIA   PAROXYSMAL ATRIAL FIBRILLATION   COPD   GERD   Acute and chronic respiratory failure   Fever   Leukocytosis, unspecified   GERD (gastroesophageal reflux disease)   Chronic diastolic heart failure    Time spent: 35 minutes    HERNANDEZ ACOSTA,[name]  [facility] Pager [phone]  If 7PM-7AM, please contact night-coverage at [email or web address], password [facility] 06/08/2013, 3:58 PM  LOS: 1 day

## 2013-06-08 NOTE — Plan of Care (Signed)
Problem: Phase I Progression Outcomes Goal: Voiding-avoid urinary catheter unless indicated Outcome: Completed/Met Date Met:  06/08/13 UOP adequate. No dysuria.

## 2013-06-09 LAB — CBC
Hemoglobin: 12.1 g/dL — ABNORMAL LOW (ref 13.0–17.0)
MCHC: 34.1 g/dL (ref 30.0–36.0)
MCV: 86 fL (ref 78.0–100.0)
Platelets: 184 10*3/uL (ref 150–400)
RBC: 4.13 MIL/uL — ABNORMAL LOW (ref 4.22–5.81)
RDW: 12.9 % (ref 11.5–15.5)

## 2013-06-09 LAB — PROTIME-INR: Prothrombin Time: 20.3 seconds — ABNORMAL HIGH (ref 11.6–15.2)

## 2013-06-09 LAB — LEGIONELLA ANTIGEN, URINE: Legionella Antigen, Urine: NEGATIVE

## 2013-06-09 LAB — EXPECTORATED SPUTUM ASSESSMENT W GRAM STAIN, RFLX TO RESP C: Special Requests: NORMAL

## 2013-06-09 LAB — EXPECTORATED SPUTUM ASSESSMENT W REFEX TO RESP CULTURE: Special Requests: NORMAL

## 2013-06-09 LAB — BASIC METABOLIC PANEL
Calcium: 8.7 mg/dL (ref 8.4–10.5)
GFR calc Af Amer: >90 mL/min (ref >90)
GFR calc non Af Amer: 83 mL/min — ABNORMAL LOW (ref >90)
Potassium: 4 mEq/L (ref 3.5–5.1)
Sodium: 136 mEq/L (ref 135–145)

## 2013-06-09 LAB — VANCOMYCIN, TROUGH: Vancomycin Tr: 11.1 ug/mL (ref 10.0–20.0)

## 2013-06-09 MED ORDER — WARFARIN SODIUM 4 MG PO TABS
4.0000 mg | ORAL_TABLET | Freq: Once | ORAL | Status: AC
  Administered 2013-06-09: 4 mg via ORAL
  Filled 2013-06-09: qty 1

## 2013-06-09 MED ORDER — GUAIFENESIN-DM 100-10 MG/5ML PO SYRP
5.0000 mL | ORAL_SOLUTION | ORAL | Status: DC | PRN
  Administered 2013-06-09 – 2013-06-10 (×3): 5 mL via ORAL
  Filled 2013-06-09 (×3): qty 10

## 2013-06-09 MED ORDER — PREDNISONE 20 MG PO TABS
40.0000 mg | ORAL_TABLET | Freq: Every day | ORAL | Status: DC
  Administered 2013-06-10: 08:00:00 40 mg via ORAL
  Filled 2013-06-09 (×2): qty 2

## 2013-06-09 NOTE — Progress Notes (Signed)
ANTICOAGULATION CONSULT NOTE - Follow up  Pharmacy Consult for Warfarin, Lovenox Indication: atrial fibrillation  No Known Allergies  Patient Measurements: Height: 5' 11.5" (181.6 cm) Weight: 175 lb 14.8 oz (79.8 kg) IBW/kg (Calculated) : 76.45 Heparin Dosing Weight:   Vital Signs: Temp: 97.4 F (36.3 C) (09/28 0515) Temp src: Oral (09/28 0515) BP: 111/59 mmHg (09/28 0515) Pulse Rate: 60 (09/28 0515)  Labs:  Recent Labs  06/07/13 1720 06/07/13 1927 06/07/13 2249 06/08/13 0415 06/08/13 0911 06/09/13 0425  HGB 13.6  --  12.2* 12.6*  --  12.1*  HCT 39.4  --  35.7* 38.8*  --  35.5*  PLT 238  --  193 197  --  184  LABPROT  --  18.1*  --  18.5*  --  20.3*  INR  --  1.54*  --  1.59*  --  1.79*  CREATININE 0.79  --   --  0.79  --  0.85  TROPONINI  --   --  <0.30 <0.30 <0.30  --     Estimated Creatinine Clearance: 80 ml/min (by C-G formula based on Cr of 0.85).  Assessment: 81 yoM admitted on 9/26 with r/o HCAP.  PMH includes afib on chronic warfarin PTA.  Pharmacy is consulted to dose warfarin inpatient and start Lovenox bridge while INR < 2.  PTA warfarin dose: 2mg  MWF and 4mg  on TuThSaSu.  Last dose 9/26  INR still subtherapeutic but rising nicely. Anticipate will be therapeutic by labs 9/29.   SCr wnl, CrCl >30.   CBC wnl. No bleeding reported/documented.  On ASA 81mg .  Methylprednisolone, Vanc, and Zosyn can increase sensitivity to warfarin.  Goal of Therapy:  INR 2-3 Anti-Xa level 0.6-1.2 units/ml 4hrs after LMWH dose given Monitor platelets by anticoagulation protocol: Yes   Plan:   Repeat Coumadin 4mg  at 1800.   Cont Lovenox 80mg  SQ BID, d/c when INR > 2.  Daily INR.  F/u renal function and CBC.  Charolotte Eke, PharmD, pager [phone]. 06/09/2013,8:58 AM.

## 2013-06-09 NOTE — Progress Notes (Signed)
ANTIBIOTIC CONSULT NOTE - Follow up  Pharmacy Consult for Vancomycin, Zosyn Indication: pneumonia  No Known Allergies  Patient Measurements: Height: 5' 11.5" (181.6 cm) Weight: 175 lb 14.8 oz (79.8 kg) IBW/kg (Calculated) : 76.45  Vital Signs: Temp: 97.4 F (36.3 C) (09/28 0515) Temp src: Oral (09/28 0515) BP: 111/59 mmHg (09/28 0515) Pulse Rate: 60 (09/28 0515)  Labs:  Recent Labs  06/07/13 1720 06/07/13 2249 06/08/13 0415 06/09/13 0425  WBC 16.9* 18.2* 23.0* 14.2*  HGB 13.6 12.2* 12.6* 12.1*  PLT 238 193 197 184  CREATININE 0.79  --  0.79 0.85   Estimated Creatinine Clearance: 80 ml/min (by C-G formula based on Cr of 0.85).  Microbiology: Recent Results (from the past 720 hour(s))  CULTURE, BLOOD (ROUTINE X 2)     Status: None   Collection Time    06/07/13  4:15 PM      Result Value Range Status   Specimen Description BLOOD RIGHT ARM   Final   Special Requests BOTTLES DRAWN AEROBIC AND ANAEROBIC 5CC   Final   Culture  Setup Time     Final   Value: 06/07/2013 21:31     Performed at [facility]   Culture     Final   Value:        BLOOD CULTURE RECEIVED NO GROWTH TO DATE CULTURE WILL BE HELD FOR 5 DAYS BEFORE ISSUING A FINAL NEGATIVE REPORT     Performed at [facility]   Report Status PENDING   Incomplete  CULTURE, BLOOD (ROUTINE X 2)     Status: None   Collection Time    06/07/13  5:15 PM      Result Value Range Status   Specimen Description BLOOD LEFT ARM   Final   Special Requests BOTTLES DRAWN AEROBIC AND ANAEROBIC 5CC   Final   Culture  Setup Time     Final   Value: 06/07/2013 21:31     Performed at [facility]   Culture     Final   Value:        BLOOD CULTURE RECEIVED NO GROWTH TO DATE CULTURE WILL BE HELD FOR 5 DAYS BEFORE ISSUING A FINAL NEGATIVE REPORT     Performed at [facility]   Report Status PENDING   Incomplete   Assessment: 32 yoM admitted 9/26 with r/o HCAP, AECOPD.   Day #3 empiric Vanc and  Zosyn.  Afebrile  WBCs elevated but improving. Getting Solumedrol now.  SCr stable, CrCl 80 ml/min.  Blood and sputum cx negative so far.  Goal of Therapy:  Vancomycin trough level 15-20 mcg/ml Appropriate abx dosing, eradication of infection.   Plan:   Cont Zosyn 3.375g IV Q8H infused over 4hrs.  Cont Vancomycin 1g IV q12h.  Measure Vanc trough before the 5th dose tonight.  Follow up renal fxn and culture results.  Charolotte Eke, PharmD, pager [phone]. 06/09/2013,9:15 AM.

## 2013-06-09 NOTE — Progress Notes (Signed)
[facility] PROGRESS NOTE  [name] Ruis ZOX:[id number] DOB: [birth year] DOA: 06/07/2013 PCP: Redmond Baseman, MD  Assessment/Plan:  Acute on Chronic Respiratory Failure -2/2 HCAP and COPD exacerbation. -See below for details. -Much improved.  HCAP/SIRS -Continue vanc/zosyn. -Cx data pending. -Afebrile.  COPD with Acute Exacerbation -Improving. -No wheezing. -Will continue steroid titration today.  A Fib -Rate controlled. -Continue coumadin dosing.  Chronic Diastolic CHF -Well compensated.  Code Status: Full Code Family Communication: Patient only  Disposition Plan: Home when medically stable.   Consultants:  None   Antibiotics:  Vancomycin 9/26-->  Zosyn 9/26-->   Subjective: Feels better, less SOB, still with productive cough. "I fell 100% better today".  Objective: Filed Vitals:   06/08/13 1954 06/08/13 2253 06/09/13 0122 06/09/13 0515  BP:  112/88  111/59  Pulse:  72  60  Temp:  97.8 F (36.6 C)  97.4 F (36.3 C)  TempSrc:  Oral  Oral  Resp:    24  Height:      Weight:   79.8 kg (175 lb 14.8 oz)   SpO2: 98% 99%  98%    Intake/Output Summary (Last 24 hours) at 06/09/13 0927 Last data filed at 06/09/13 0654  Gross per 24 hour  Intake    240 ml  Output   2450 ml  Net  -2210 ml   Filed Weights   06/07/13 2018 06/08/13 0500 06/09/13 0122  Weight: 79.3 kg (174 lb 13.2 oz) 79.6 kg (175 lb 7.8 oz) 79.8 kg (175 lb 14.8 oz)    Exam:   General:  AA Ox3  Cardiovascular: RRR  Respiratory: CTA B  Abdomen: S/NT/ND/+BS  Extremities: no C/C/E   Neurologic:  Grossly intact and non-focal  Data Reviewed: Basic Metabolic Panel:  Recent Labs Lab 06/07/13 1720 06/08/13 0415 06/09/13 0425  NA 139 136 136  K 3.4* 3.7 4.0  CL 98 98 99  CO2 29 26 28   GLUCOSE 74 111* 138*  BUN 8 10 18   CREATININE 0.79 0.79 0.85  CALCIUM 9.7 9.0 8.7   Liver Function Tests:  Recent Labs Lab 06/07/13 1720  AST 19  ALT 10  ALKPHOS 82   BILITOT 0.6  PROT 7.2  ALBUMIN 3.9   No results found for this basename: LIPASE, AMYLASE,  in the last 168 hours No results found for this basename: AMMONIA,  in the last 168 hours CBC:  Recent Labs Lab 06/07/13 1720 06/07/13 2249 06/08/13 0415 06/09/13 0425  WBC 16.9* 18.2* 23.0* 14.2*  NEUTROABS 14.5*  --   --   --   HGB 13.6 12.2* 12.6* 12.1*  HCT 39.4 35.7* 38.8* 35.5*  MCV 86.2 86.7 88.0 86.0  PLT 238 193 197 184   Cardiac Enzymes:  Recent Labs Lab 06/07/13 2249 06/08/13 0415 06/08/13 0911  TROPONINI <0.30 <0.30 <0.30   BNP (last 3 results)  Recent Labs  09/30/12 1600  PROBNP 51.4   CBG: No results found for this basename: GLUCAP,  in the last 168 hours  Recent Results (from the past 240 hour(s))  CULTURE, BLOOD (ROUTINE X 2)     Status: None   Collection Time    06/07/13  4:15 PM      Result Value Range Status   Specimen Description BLOOD RIGHT ARM   Final   Special Requests BOTTLES DRAWN AEROBIC AND ANAEROBIC 5CC   Final   Culture  Setup Time     Final   Value: 06/07/2013 21:31     Performed at [facility]   Culture     Final   Value:        BLOOD CULTURE RECEIVED NO GROWTH TO DATE CULTURE WILL BE HELD FOR 5 DAYS BEFORE ISSUING A FINAL NEGATIVE REPORT     Performed at [facility]   Report Status PENDING   Incomplete  CULTURE, BLOOD (ROUTINE X 2)     Status: None   Collection Time    06/07/13  5:15 PM      Result Value Range Status   Specimen Description BLOOD LEFT ARM   Final   Special Requests BOTTLES DRAWN AEROBIC AND ANAEROBIC 5CC   Final   Culture  Setup Time     Final   Value: 06/07/2013 21:31     Performed at [facility]   Culture     Final   Value:        BLOOD CULTURE RECEIVED NO GROWTH TO DATE CULTURE WILL BE HELD FOR 5 DAYS BEFORE ISSUING A FINAL NEGATIVE REPORT     Performed at [facility]   Report Status PENDING   Incomplete     Studies: Dg Chest 2 View  06/07/2013   *RADIOLOGY REPORT*   Clinical Data: Fever, shortness of breath and cough  CHEST - 2 VIEW  Comparison: 01/01/2013 and prior chest radiographs dating back to 2010  Findings: The cardiomediastinal silhouette is unremarkable. Patchy left basilar opacity may represent pneumonia. There is no evidence of pleural effusion, mass, pneumothorax or pulmonary edema. COPD/emphysema again identified. No acute bony abnormalities are identified.  IMPRESSION: Patchy left basilar opacity which may represent pneumonia. Radiographic follow up to resolution is recommended.  COPD/emphysema.   Original Report Authenticated By: Harmon Pier, M.D.    Scheduled Meds: . aspirin EC  81 mg Oral Daily  . budesonide (PULMICORT) nebulizer solution  0.25 mg Nebulization BID  . enoxaparin (LOVENOX) injection  80 mg Subcutaneous BID  . methylPREDNISolone (SOLU-MEDROL) injection  40 mg Intravenous Daily  . montelukast  10 mg Oral QHS  . pantoprazole  40 mg Oral BID  . piperacillin-tazobactam (ZOSYN)  IV  3.375 g Intravenous Q8H  . sodium chloride  3 mL Intravenous Q12H  . tiotropium  18 mcg Inhalation Daily  . vancomycin  1,000 mg Intravenous BID  . warfarin  4 mg Oral ONCE-1800  . Warfarin - Pharmacist Dosing Inpatient   Does not apply q1800   Continuous Infusions:   Principal Problem:   SIRS (systemic inflammatory response syndrome) Active Problems:   Healthcare-associated pneumonia   HYPERLIPIDEMIA   PAROXYSMAL ATRIAL FIBRILLATION   COPD   GERD   Acute and chronic respiratory failure   Fever   Leukocytosis, unspecified   GERD (gastroesophageal reflux disease)   Chronic diastolic heart failure    Time spent: 35 minutes    Cody ACOSTA,[name]  [facility] Pager [phone]  If 7PM-7AM, please contact night-coverage at [email or web address], password [facility] 06/09/2013, 9:27 AM  LOS: 2 days

## 2013-06-10 LAB — PROTIME-INR: Prothrombin Time: 21.9 seconds — ABNORMAL HIGH (ref 11.6–15.2)

## 2013-06-10 LAB — CBC
HCT: 34.8 % — ABNORMAL LOW (ref 39.0–52.0)
MCH: 29.1 pg (ref 26.0–34.0)
MCHC: 33.6 g/dL (ref 30.0–36.0)
Platelets: 170 10*3/uL (ref 150–400)
RBC: 4.02 MIL/uL — ABNORMAL LOW (ref 4.22–5.81)
RDW: 13 % (ref 11.5–15.5)

## 2013-06-10 MED ORDER — LEVOFLOXACIN 500 MG PO TABS: 500.0000 mg | ORAL_TABLET | Freq: Every day | ORAL | Status: DC

## 2013-06-10 MED ORDER — WARFARIN SODIUM 2 MG PO TABS
2.0000 mg | ORAL_TABLET | Freq: Once | ORAL | Status: DC
  Filled 2013-06-10: qty 1

## 2013-06-10 MED ORDER — VANCOMYCIN HCL 10 G IV SOLR
1250.0000 mg | Freq: Two times a day (BID) | INTRAVENOUS | Status: DC
  Administered 2013-06-10: 08:00:00 1250 mg via INTRAVENOUS
  Filled 2013-06-10 (×2): qty 1250

## 2013-06-10 MED ORDER — PREDNISONE 10 MG PO TABS: 10.0000 mg | ORAL_TABLET | Freq: Every day | ORAL | Status: DC

## 2013-06-10 NOTE — Discharge Summary (Signed)
Physician Discharge Summary  [name] WUJ:[id number] DOB: [birth year] DOA: 06/07/2013  PCP: Redmond Baseman, MD  Admit date: 06/07/2013 Discharge date: 06/10/2013  Time spent: 45 minutes  Recommendations for Outpatient Follow-up:  -Advised to follow up with PCP in 2 weeks. -Would recommend repeat CXR in 4-6 weeks to ensure complete resolution of his PNA.   Discharge Diagnoses:  Principal Problem:   SIRS (systemic inflammatory response syndrome) Active Problems:   Healthcare-associated pneumonia   HYPERLIPIDEMIA   PAROXYSMAL ATRIAL FIBRILLATION   COPD   GERD   Acute and chronic respiratory failure   Fever   Leukocytosis, unspecified   GERD (gastroesophageal reflux disease)   Chronic diastolic heart failure   Discharge Condition: Stab;e and improved  Filed Weights   06/08/13 0500 06/09/13 0122 06/10/13 0432  Weight: 79.6 kg (175 lb 7.8 oz) 79.8 kg (175 lb 14.8 oz) 76.9 kg (169 lb 8.5 oz)    History of present illness:  [name] is a 78 y.o. male with past medical history significant for paroxysmal atrial fibrillation (on Coumadin), hyperlipidemia, GERD, asthma/COPD (with chronic respiratory failure oxygen dependent at home), coronary artery disease, chronic grade 1 diastolic dysfunction (ejection fraction 60-65% 2-D echo in 2012) and recent admission secondary to incarcerated hernia (August 2014); came to the hospital secondary to increase shortness of breath, cough, general malaise, fever and left-sided chest discomfort. Patient reports that he has not been feeling himself for the last 2 days or so and has been having intermittent episodes of shortness of breath. He reports that he only use the oxygen when he is at home but he has failed more short of breath, nonproductive cough, and spiking fevers for the last 2-3 days as well. In the ED patient was found without respiratory rate of 36, oxygen saturation in mid 80s on room air, temperature 103, WBCs 16,000 and a  chest x-ray demonstrating left patchy opacities of/infiltrate suggesting pneumonia. [facility] has been called to admit the patient for further evaluation and treatment of healthcare associated pneumonia and SIRS.   Hospital Course:   Acute on Chronic Respiratory Failure  -2/2 HCAP and COPD exacerbation.  -See below for details.  -Resolved. On baseline levels of home oxygen.  HCAP/SIRS  -All culture data is negative to date. -Given patient's clinical improvement and his desire to go home today, will transition him over to PO levaquin to complete 5 more days of treatment on DC.  COPD with Acute Exacerbation  -Resolved. -No wheezing.  -Will DC on a prednisone taper.  A Fib  -Rate controlled.  -Continue coumadin dosing.   Chronic Diastolic CHF  -Well compensated.   Procedures:  None   Consultations:  None  Discharge Instructions  Discharge Orders   Future Appointments Provider Department Dept Phone   07/04/2013 2:45 PM [name], MD [facility], [place] [phone]   07/22/2013 10:00 AM Beverley Fiedler, MD [facility] [phone]   08/05/2013 2:20 PM Ileana Ladd, MD [facility] [phone]   Future Orders Complete By Expires   Diet - low sodium heart healthy  As directed    Discontinue IV  As directed    Increase activity slowly  As directed        Medication List    STOP taking these medications       albuterol 0.63 MG/3ML nebulizer solution  Commonly known as:  ACCUNEB      TAKE these medications       levofloxacin 500  MG tablet  Commonly known as:  LEVAQUIN  Take 1 tablet (500 mg total) by mouth daily. For 7 days     mometasone-formoterol 200-5 MCG/ACT Aero  Commonly known as:  DULERA  Inhale 2 puffs into the lungs 2 (two) times daily.     montelukast 10 MG tablet  Commonly known as:  SINGULAIR  Take 10 mg by mouth at bedtime.     omeprazole 20 MG capsule  Commonly known as:   PRILOSEC  Take 40 mg by mouth daily.     predniSONE 10 MG tablet  Commonly known as:  DELTASONE  Take 1 tablet (10 mg total) by mouth daily. Take 4 tablets today and then decrease by one tablet daily until none are left.     tiotropium 18 MCG inhalation capsule  Commonly known as:  SPIRIVA  Place 18 mcg into inhaler and inhale daily.     warfarin 2 MG tablet  Commonly known as:  COUMADIN  Take 2-4 mg by mouth daily. Take one tablet on mon, wed, and Friday, and take two tablets on all other days       No Known Allergies     Follow-up Information   Follow up with Redmond Baseman, MD. Schedule an appointment as soon as possible for a visit in 2 weeks.   Specialty:  Family Medicine   Contact information:   [address] [place] [address] [phone]        The results of significant diagnostics from this hospitalization (including imaging, microbiology, ancillary and laboratory) are listed below for reference.    Significant Diagnostic Studies: Dg Chest 2 View  06/07/2013   *RADIOLOGY REPORT*  Clinical Data: Fever, shortness of breath and cough  CHEST - 2 VIEW  Comparison: 01/01/2013 and prior chest radiographs dating back to 2010  Findings: The cardiomediastinal silhouette is unremarkable. Patchy left basilar opacity may represent pneumonia. There is no evidence of pleural effusion, mass, pneumothorax or pulmonary edema. COPD/emphysema again identified. No acute bony abnormalities are identified.  IMPRESSION: Patchy left basilar opacity which may represent pneumonia. Radiographic follow up to resolution is recommended.  COPD/emphysema.   Original Report Authenticated By: Harmon Pier, M.D.    Microbiology: Recent Results (from the past 240 hour(s))  CULTURE, BLOOD (ROUTINE X 2)     Status: None   Collection Time    06/07/13  4:15 PM      Result Value Range Status   Specimen Description BLOOD RIGHT ARM   Final   Special Requests BOTTLES DRAWN AEROBIC AND  ANAEROBIC 5CC   Final   Culture  Setup Time     Final   Value: 06/07/2013 21:31     Performed at [facility]   Culture     Final   Value:        BLOOD CULTURE RECEIVED NO GROWTH TO DATE CULTURE WILL BE HELD FOR 5 DAYS BEFORE ISSUING A FINAL NEGATIVE REPORT     Performed at [facility]   Report Status PENDING   Incomplete  CULTURE, BLOOD (ROUTINE X 2)     Status: None   Collection Time    06/07/13  5:15 PM      Result Value Range Status   Specimen Description BLOOD LEFT ARM   Final   Special Requests BOTTLES DRAWN AEROBIC AND ANAEROBIC 5CC   Final   Culture  Setup Time     Final   Value: 06/07/2013 21:31     Performed at  [facility]     Final   Value:        BLOOD CULTURE RECEIVED NO GROWTH TO DATE CULTURE WILL BE HELD FOR 5 DAYS BEFORE ISSUING A FINAL NEGATIVE REPORT     Performed at [facility]   Report Status PENDING   Incomplete  CULTURE, EXPECTORATED SPUTUM-ASSESSMENT     Status: None   Collection Time    06/09/13  3:26 PM      Result Value Range Status   Specimen Description SPU   Final   Special Requests Normal   Final   Sputum evaluation     Final   Value: MICROSCOPIC FINDINGS SUGGEST THAT THIS SPECIMEN IS NOT REPRESENTATIVE OF LOWER RESPIRATORY SECRETIONS. PLEASE RECOLLECT.     RESULTS CALLED TO AND VERIFIED WITH Crisoforo Oxford [id number] @ 1628 BY J SCOTTON   Report Status 06/09/2013 FINAL   Final  CULTURE, EXPECTORATED SPUTUM-ASSESSMENT     Status: None   Collection Time    06/09/13  6:16 PM      Result Value Range Status   Specimen Description SPUTUM   Final   Special Requests Normal   Final   Sputum evaluation     Final   Value: THIS SPECIMEN IS ACCEPTABLE. RESPIRATORY CULTURE REPORT TO FOLLOW.   Report Status 06/09/2013 FINAL   Final     Labs: Basic Metabolic Panel:  Recent Labs Lab 06/07/13 1720 06/08/13 0415 06/09/13 0425  NA 139 136 136  K 3.4* 3.7 4.0  CL 98 98 99  CO2 29 26 28   GLUCOSE 74 111*  138*  BUN 8 10 18   CREATININE 0.79 0.79 0.85  CALCIUM 9.7 9.0 8.7   Liver Function Tests:  Recent Labs Lab 06/07/13 1720  AST 19  ALT 10  ALKPHOS 82  BILITOT 0.6  PROT 7.2  ALBUMIN 3.9   No results found for this basename: LIPASE, AMYLASE,  in the last 168 hours No results found for this basename: AMMONIA,  in the last 168 hours CBC:  Recent Labs Lab 06/07/13 1720 06/07/13 2249 06/08/13 0415 06/09/13 0425 06/10/13 0448  WBC 16.9* 18.2* 23.0* 14.2* 9.4  NEUTROABS 14.5*  --   --   --   --   HGB 13.6 12.2* 12.6* 12.1* 11.7*  HCT 39.4 35.7* 38.8* 35.5* 34.8*  MCV 86.2 86.7 88.0 86.0 86.6  PLT 238 193 197 184 170   Cardiac Enzymes:  Recent Labs Lab 06/07/13 2249 06/08/13 0415 06/08/13 0911  TROPONINI <0.30 <0.30 <0.30   BNP: BNP (last 3 results)  Recent Labs  09/30/12 1600  PROBNP 51.4   CBG: No results found for this basename: GLUCAP,  in the last 168 hours     Signed:  Chaya Jan  [facility] Pager: [phone] 06/10/2013, 3:29 PM

## 2013-06-10 NOTE — Progress Notes (Signed)
ANTICOAGULATION CONSULT NOTE - Follow up  Pharmacy Consult for Warfarin, Lovenox Indication: atrial fibrillation  No Known Allergies  Patient Measurements: Height: 5' 11.5" (181.6 cm) Weight: 169 lb 8.5 oz (76.9 kg) IBW/kg (Calculated) : 76.45  Vital Signs: Temp: 97.4 F (36.3 C) (09/29 0431) Temp src: Axillary (09/29 0431) BP: 129/73 mmHg (09/29 0431) Pulse Rate: 96 (09/29 0431)  Labs:  Recent Labs  06/07/13 1720  06/07/13 2249 06/08/13 0415 06/08/13 0911 06/09/13 0425 06/10/13 0448  HGB 13.6  --  12.2* 12.6*  --  12.1* 11.7*  HCT 39.4  --  35.7* 38.8*  --  35.5* 34.8*  PLT 238  --  193 197  --  184 170  LABPROT  --   < >  --  18.5*  --  20.3* 21.9*  INR  --   < >  --  1.59*  --  1.79* 1.98*  CREATININE 0.79  --   --  0.79  --  0.85  --   TROPONINI  --   --  <0.30 <0.30 <0.30  --   --   < > = values in this interval not displayed.  Estimated Creatinine Clearance: 80 ml/min (by C-G formula based on Cr of 0.85).  Assessment: 88 yoM admitted on 9/26 with r/o HCAP.  PMH includes afib on chronic warfarin PTA.  Pharmacy is consulted to dose warfarin inpatient and start Lovenox bridge while INR < 2.  PTA warfarin dose: 2mg  MWF and 4mg  on TuThSaSu.  Last dose 9/26  INR still subtherapeutic but rising nicely. INR just below therapeutic at 1.98 today.  SCr wnl, CrCl >30.   Hgb decreased to 11.7, Plt 170.  No bleeding reported/documented.  On ASA 81mg .  Methylprednisolone, Vanc, and Zosyn can increase sensitivity to warfarin.  Goal of Therapy:  INR 2-3 Anti-Xa level 0.6-1.2 units/ml 4hrs after LMWH dose given Monitor platelets by anticoagulation protocol: Yes   Plan:   Warfarin 2mg  PO today at 1800.   Cont Lovenox 80mg  SQ BID, d/c when INR > 2.  Daily INR.  F/u renal function and CBC.   Lynann Beaver PharmD, BCPS Pager [phone] 06/10/2013 7:08 AM

## 2013-06-10 NOTE — Progress Notes (Signed)
PHARMACY - VANCOMYCIN (brief note)  Pt on Vancomycin regimen of 1gm IV q12h. Vancomycin trough level = 11.1 mcg/ml (goal 15-20) for R/O HCAP Also on Zosyn 3.375gm IV q8h  Vancomycin 1gm dose last charted @ 22:08 on 9/28  Plan:  Increase Vancomycin to 1250mg  IV q12h (will adjust regimen with next dose)  Terrilee Files, PharmD 06/10/13 @ 00:17

## 2013-06-12 LAB — CULTURE, RESPIRATORY W GRAM STAIN

## 2013-06-13 LAB — CULTURE, BLOOD (ROUTINE X 2): Culture: NO GROWTH

## 2013-06-28 ENCOUNTER — Encounter: Payer: Self-pay | Admitting: Family Medicine

## 2013-06-28 ENCOUNTER — Ambulatory Visit (INDEPENDENT_AMBULATORY_CARE_PROVIDER_SITE_OTHER): Payer: Medicare Other | Admitting: Family Medicine

## 2013-06-28 VITALS — BP 166/78 | HR 92 | Temp 97.3°F | Ht 71.5 in | Wt 174.0 lb

## 2013-06-28 DIAGNOSIS — G4700 Insomnia, unspecified: Secondary | ICD-10-CM

## 2013-06-28 DIAGNOSIS — M25569 Pain in unspecified knee: Secondary | ICD-10-CM

## 2013-06-28 DIAGNOSIS — M25562 Pain in left knee: Secondary | ICD-10-CM

## 2013-06-28 MED ORDER — TRAZODONE HCL 50 MG PO TABS: 25.0000 mg | ORAL_TABLET | Freq: Every evening | ORAL | Status: DC | PRN

## 2013-06-28 NOTE — Progress Notes (Signed)
  Subjective:    Patient ID: [name], male    DOB: 05-03-1935, 78 y.o.   MRN: [id number]  HPI This 78 y.o. male presents for evaluation of left knee pain and insomnia.  He has Been having difficulty with insomnia since getting out of the hospital for Hernia repair.   Review of Systems C/o left knee pain and insomnia No chest pain, SOB, HA, dizziness, vision change, N/V, diarrhea, constipation, dysuria, urinary urgency or frequency or rash.     Objective:   Physical Exam Vital signs noted  Well developed well nourished male.  HEENT - Head atraumatic Normocephalic                Eyes - PERRLA, Conjuctiva - clear Sclera- Clear EOMI                Ears - EAC's Wnl TM's Wnl Gross Hearing WNL                Nose - Nares patent                 Throat - oropharanx wnl Respiratory - Lungs CTA bilateral Cardiac - RRR S1 and S2 without murmur MS - TTP left knee  Procedure - Left lateral knee prepped with ETOH and then injected knee joint with 2 cc's of lidocaine And one cc of kenalog 40mg  and patient tolerates well and expresses relief of pain.       Assessment & Plan:  Insomnia - trazadone 50mg  one half to one po qhs prn insomnia #30w/3  Knee pain, left - Injection of left knee.  Deatra Canter FNP

## 2013-06-28 NOTE — Patient Instructions (Signed)
Knee Pain  The knee is the complex joint between your thigh and your lower leg. It is made up of bones, tendons, ligaments, and cartilage. The bones that make up the knee are:   The femur in the thigh.   The tibia and fibula in the lower leg.   The patella or kneecap riding in the groove on the lower femur.  CAUSES   Knee pain is a common complaint with many causes. A few of these causes are:   Injury, such as:   A ruptured ligament or tendon injury.   Torn cartilage.   Medical conditions, such as:   Gout   Arthritis   Infections   Overuse, over training or overdoing a physical activity.  Knee pain can be minor or severe. Knee pain can accompany debilitating injury. Minor knee problems often respond well to self-care measures or get well on their own. More serious injuries may need medical intervention or even surgery.  SYMPTOMS  The knee is complex. Symptoms of knee problems can vary widely. Some of the problems are:   Pain with movement and weight bearing.   Swelling and tenderness.   Buckling of the knee.   Inability to straighten or extend your knee.   Your knee locks and you cannot straighten it.   Warmth and redness with pain and fever.   Deformity or dislocation of the kneecap.  DIAGNOSIS   Determining what is wrong may be very straight forward such as when there is an injury. It can also be challenging because of the complexity of the knee. Tests to make a diagnosis may include:   Your caregiver taking a history and doing a physical exam.   Routine X-rays can be used to rule out other problems. X-rays will not reveal a cartilage tear. Some injuries of the knee can be diagnosed by:   Arthroscopy a surgical technique by which a small video camera is inserted through tiny incisions on the sides of the knee. This procedure is used to examine and repair internal knee joint problems. Tiny instruments can be used during arthroscopy to repair the torn knee cartilage (meniscus).   Arthrography  is a radiology technique. A contrast liquid is directly injected into the knee joint. Internal structures of the knee joint then become visible on X-ray film.   An MRI scan is a non x-ray radiology procedure in which magnetic fields and a computer produce two- or three-dimensional images of the inside of the knee. Cartilage tears are often visible using an MRI scanner. MRI scans have largely replaced arthrography in diagnosing cartilage tears of the knee.   Blood work.   Examination of the fluid that helps to lubricate the knee joint (synovial fluid). This is done by taking a sample out using a needle and a syringe.  TREATMENT  The treatment of knee problems depends on the cause. Some of these treatments are:   Depending on the injury, proper casting, splinting, surgery or physical therapy care will be needed.   Give yourself adequate recovery time. Do not overuse your joints. If you begin to get sore during workout routines, back off. Slow down or do fewer repetitions.   For repetitive activities such as cycling or running, maintain your strength and nutrition.   Alternate muscle groups. For example if you are a weight lifter, work the upper body on one day and the lower body the next.   Either tight or weak muscles do not give the proper support for your   knee. Tight or weak muscles do not absorb the stress placed on the knee joint. Keep the muscles surrounding the knee strong.   Take care of mechanical problems.   If you have flat feet, orthotics or special shoes may help. See your caregiver if you need help.   Arch supports, sometimes with wedges on the inner or outer aspect of the heel, can help. These can shift pressure away from the side of the knee most bothered by osteoarthritis.   A brace called an "unloader" brace also may be used to help ease the pressure on the most arthritic side of the knee.   If your caregiver has prescribed crutches, braces, wraps or ice, use as directed. The acronym for  this is PRICE. This means protection, rest, ice, compression and elevation.   Nonsteroidal anti-inflammatory drugs (NSAID's), can help relieve pain. But if taken immediately after an injury, they may actually increase swelling. Take NSAID's with food in your stomach. Stop them if you develop stomach problems. Do not take these if you have a history of ulcers, stomach pain or bleeding from the bowel. Do not take without your caregiver's approval if you have problems with fluid retention, heart failure, or kidney problems.   For ongoing knee problems, physical therapy may be helpful.   Glucosamine and chondroitin are over-the-counter dietary supplements. Both may help relieve the pain of osteoarthritis in the knee. These medicines are different from the usual anti-inflammatory drugs. Glucosamine may decrease the rate of cartilage destruction.   Injections of a corticosteroid drug into your knee joint may help reduce the symptoms of an arthritis flare-up. They may provide pain relief that lasts a few months. You may have to wait a few months between injections. The injections do have a small increased risk of infection, water retention and elevated blood sugar levels.   Hyaluronic acid injected into damaged joints may ease pain and provide lubrication. These injections may work by reducing inflammation. A series of shots may give relief for as long as 6 months.   Topical painkillers. Applying certain ointments to your skin may help relieve the pain and stiffness of osteoarthritis. Ask your pharmacist for suggestions. Many over the-counter products are approved for temporary relief of arthritis pain.   In some countries, doctors often prescribe topical NSAID's for relief of chronic conditions such as arthritis and tendinitis. A review of treatment with NSAID creams found that they worked as well as oral medications but without the serious side effects.  PREVENTION   Maintain a healthy weight. Extra pounds put  more strain on your joints.   Get strong, stay limber. Weak muscles are a common cause of knee injuries. Stretching is important. Include flexibility exercises in your workouts.   Be smart about exercise. If you have osteoarthritis, chronic knee pain or recurring injuries, you may need to change the way you exercise. This does not mean you have to stop being active. If your knees ache after jogging or playing basketball, consider switching to swimming, water aerobics or other low-impact activities, at least for a few days a week. Sometimes limiting high-impact activities will provide relief.   Make sure your shoes fit well. Choose footwear that is right for your sport.   Protect your knees. Use the proper gear for knee-sensitive activities. Use kneepads when playing volleyball or laying carpet. Buckle your seat belt every time you drive. Most shattered kneecaps occur in car accidents.   Rest when you are tired.  SEEK MEDICAL CARE IF:     You have knee pain that is continual and does not seem to be getting better.   SEEK IMMEDIATE MEDICAL CARE IF:   Your knee joint feels hot to the touch and you have a high fever.  MAKE SURE YOU:    Understand these instructions.   Will watch your condition.   Will get help right away if you are not doing well or get worse.  Document Released: 06/26/2007 Document Revised: 11/21/2011 Document Reviewed: 06/26/2007  [employer] Patient Information 2014 [employer].

## 2013-07-04 ENCOUNTER — Encounter (INDEPENDENT_AMBULATORY_CARE_PROVIDER_SITE_OTHER): Payer: Medicare Other | Admitting: General Surgery

## 2013-07-04 ENCOUNTER — Telehealth: Payer: Self-pay | Admitting: Pharmacist

## 2013-07-04 NOTE — Telephone Encounter (Signed)
Patient has missed last 3 appointments to recheck protime.  Called to reschedule.  No answer so left message with appt for 07/16/13 at 9:50am

## 2013-07-08 ENCOUNTER — Encounter (INDEPENDENT_AMBULATORY_CARE_PROVIDER_SITE_OTHER): Payer: Self-pay | Admitting: General Surgery

## 2013-07-15 ENCOUNTER — Ambulatory Visit (INDEPENDENT_AMBULATORY_CARE_PROVIDER_SITE_OTHER): Payer: Medicare Other | Admitting: General Practice

## 2013-07-15 ENCOUNTER — Ambulatory Visit (INDEPENDENT_AMBULATORY_CARE_PROVIDER_SITE_OTHER): Payer: Medicare Other | Admitting: Pharmacist

## 2013-07-15 VITALS — BP 129/74 | HR 72 | Temp 97.2°F

## 2013-07-15 DIAGNOSIS — I471 Supraventricular tachycardia: Secondary | ICD-10-CM

## 2013-07-15 DIAGNOSIS — I4891 Unspecified atrial fibrillation: Secondary | ICD-10-CM

## 2013-07-15 DIAGNOSIS — R252 Cramp and spasm: Secondary | ICD-10-CM

## 2013-07-15 LAB — POCT CBC
Hemoglobin: 14.4 g/dL (ref 14.1–18.1)
Lymph, poc: 2.4 (ref 0.6–3.4)
MCH, POC: 28.2 pg (ref 27–31.2)
MCHC: 32.4 g/dL (ref 31.8–35.4)
MCV: 87 fL (ref 80–97)
MPV: 10.2 fL (ref 0–99.8)
POC Granulocyte: 7 — AB (ref 2–6.9)
POC LYMPH PERCENT: 24.4 %L (ref 10–50)
Platelet Count, POC: 213 10*3/uL (ref 142–424)
RDW, POC: 13.6 %

## 2013-07-15 LAB — POCT INR: INR: 1.2

## 2013-07-15 NOTE — Patient Instructions (Signed)
Anticoagulation Dose Instructions as of 07/15/2013     Glynis Smiles Tue Wed Thu Fri Sat   New Dose 4 mg 4 mg 4 mg 4 mg 4 mg 4 mg 4 mg    Description       Take 2 and 1/2 tablets today (= 5mg ) then start 2 tablets (=4mg ) daily     INR was 1.2 today (too thick)

## 2013-07-15 NOTE — Patient Instructions (Addendum)
Restless Legs Syndrome Restless legs syndrome is a movement disorder. It may also be called a sensori-motor disorder.  CAUSES  No one knows what specifically causes restless legs syndrome, but it tends to run in families. It is also more common in people with low iron, in pregnancy, in people who need dialysis, and those with nerve damage (neuropathy).Some medications may make restless legs syndrome worse.Those medications include drugs to treat high blood pressure, some heart conditions, nausea, colds, allergies, and depression. SYMPTOMS Symptoms include uncomfortable sensations in the legs. These leg sensations are worse during periods of inactivity or rest. They are also worse while sitting or lying down. Individuals that have the disorder describe sensations in the legs that feel like:  Pulling.  Drawing.  Crawling.  Worming.  Boring.  Tingling.  Pins and needles.  Prickling.  Pain. The sensations are usually accompanied by an overwhelming urge to move the legs. Sudden muscle jerks may also occur. Movement provides temporary relief from the discomfort. In rare cases, the arms may also be affected. Symptoms may interfere with going to sleep (sleep onset insomnia). Restless legs syndrome may also be related to periodic limb movement disorder (PLMD). PLMD is another more common motor disorder. It also causes interrupted sleep. The symptoms from PLMD usually occur most often when you are awake. TREATMENT  Treatment for restless legs syndrome is symptomatic. This means that the symptoms are treated.   Massage and cold compresses may provide temporary relief.  Walk, stretch, or take a cold or hot bath.  Get regular exercise and a good night's sleep.  Avoid caffeine, alcohol, nicotine, and medications that can make it worse.  Do activities that provide mental stimulation like discussions, needlework, and video games. These may be helpful if you are not able to walk or  stretch. Some medications are effective in relieving the symptoms. However, many of these medications have side effects. Ask your caregiver about medications that may help your symptoms. Correcting iron deficiency may improve symptoms for some patients. Document Released: 08/19/2002 Document Revised: 11/21/2011 Document Reviewed: 11/25/2010 [facility] Patient Information 2014 [place], [place].  Potassium Content of Foods Potassium is a mineral found in many foods and drinks. It helps keep fluids and minerals balanced in your body and also affects how steadily your heart beats. The body needs potassium to control blood pressure and to keep the muscles and nervous system healthy. However, certain health conditions and medicine may require you to eat more or less potassium-rich foods and drinks. Your caregiver or dietitian will tell you how much potassium you should have each day. COMMON SERVING SIZES The list below tells you how big or small common portion sizes are:  1 oz.........4 stacked dice.  3 oz........Marland KitchenDeck of cards.  1 tsp.......Marland KitchenTip of little finger.  1 tbsp....Marland KitchenMarland KitchenThumb.  2 tbsp....Marland KitchenMarland KitchenGolf ball.   c..........Marland KitchenHalf of a fist.  1 c...........Marland KitchenA fist. FOODS AND DRINKS HIGH IN POTASSIUM More than 200 mg of potassium per serving. A serving size is  c (120 mL or noted gram weight) unless otherwise stated. While all the items on this list are high in potassium, some items are higher in potassium than others. Fruits  Apricots (sliced), 83 g.  Apricots (dried halves), 3 oz / 24 g.  Avocado (cubed),  c / 50 g.  Banana (sliced), 75 g.  Cantaloupe (cubed), 80 g.  Dates (pitted), 5 whole / 35 g.  Figs (dried), 4 whole / 32 g.  Guava, c / 55 g.  Honeydew, 1 wedge /  85 g.  Kiwi (sliced), 90 g.  Nectarine, 1 small / 129 g.  Orange, 1 medium / 131 g.  Orange juice.  Pomegranate seeds, 87 g.  Pomegranate juice.  Prunes (pitted), 3 whole / 30 g.  Prune juice, 3 oz / 90  mL.  Seedless raisins, 3 tbsp / 27 g. Vegetables  Artichoke,  of a medium / 64 g.  Asparagus (boiled), 90 g.  Baked beans,  c / 63 g.  Bamboo shoots,  c / 38 g.  Beets (cooked slices), 85 g.  Broccoli (boiled), 78 g.  Brussels sprout (boiled), 78 g.  Butternut squash (baked), 103 g.  Chickpea (cooked), 82 g.  Green peas (cooked), 80 g.  Hubbard squash (baked cubes),  c / 68 g.  Kidney beans (cooked), 5 tbsp / 55 g.  Lima beans (cooked),  c / 43 g.  Navy beans (cooked),  c / 61 g.  Potato (baked), 61 g.  Potato (boiled), 78 g.  Pumpkin (boiled), 123 g.  Refried beans,  c / 79 g.  Spinach (cooked),  c / 45 g.  Split peas (cooked),  c / 65 g.  Sun-dried tomatoes, 2 tbsp / 7 g.  Sweet potato (baked),  c / 50 g.  Tomato (chopped or sliced), 90 g.  Tomato juice.  Tomato paste, 4 tsp / 21 g.  Tomato sauce,  c / 61 g.  Vegetable juice.  White mushrooms (cooked), 78 g.  Yam (cooked or baked),  c / 34 g.  Zucchini squash (boiled), 90 g. Other Foods and Drinks  Almonds (whole),  c / 36 g.  Cashews (oil roasted),  c / 32 g.  Chocolate milk.  Chocolate pudding, 142 g.  Clams (steamed), 1.5 oz / 43 g.  Dark chocolate, 1.5 oz / 42 g.  Fish, 3 oz / 85 g.  King crab (steamed), 3 oz / 85 g.  Lobster (steamed), 4 oz / 113 g.  Milk (skim, 1%, 2%, whole), 1 c / 240 mL.  Milk chocolate, 2.3 oz / 66 g.  Milk shake.  Nonfat fruit variety yogurt, 123 g.  Peanuts (oil roasted), 1 oz / 28 g.  Peanut butter, 2 tbsp / 32 g.  Pistachio nuts, 1 oz / 28 g.  Pumpkin seeds, 1 oz / 28 g.  Red meat (broiled, cooked, grilled), 3 oz / 85 g.  Scallops (steamed), 3 oz / 85 g.  Shredded wheat cereal (dry), 3 oblong biscuits / 75 g.  Spaghetti sauce,  c / 66 g.  Sunflower seeds (dry roasted), 1 oz / 28 g.  Veggie burger, 1 patty / 70 g. FOODS MODERATE IN POTASSIUM Between 150 mg and 200 mg per serving. A serving is  c (120 mL or noted  gram weight) unless otherwise stated. Fruits  Grapefruit,  of the fruit / 123 g.  Grapefruit juice.  Pineapple juice.  Plums (sliced), 83 g.  Tangerine, 1 large / 120 g. Vegetables  Carrots (boiled), 78 g.  Carrots (sliced), 61 g.  Rhubarb (cooked with sugar), 120 g.  R[place]abaga (cooked), 120 g.  Sweet corn (cooked), 75 g.  Yellow snap beans (cooked), 63 g. Other Foods and Drinks   Bagel, 1 bagel / 98 g.  Chicken breast (roasted and chopped),  c / 70 g.  Chocolate ice cream / 66 g.  Pita bread, 1 large / 64 g.  Shrimp (steamed), 4 oz / 113 g.  Swiss cheese (diced), 70 g.  Vanilla ice  cream, 66 g.  Vanilla pudding, 140 g. FOODS LOW IN POTASSIUM Less than 150 mg per serving. A serving size is  cup (120 mL or noted gram weight) unless otherwise stated. If you eat more than 1 serving of a food low in potassium, the food may be considered a food high in potassium. Fruits  Apple (slices), 55 g.  Apple juice.  Applesauce, 122 g.  Blackberries, 72 g.  Blueberries, 74 g.  Cranberries, 50 g.  Cranberry juice.  Fruit cocktail, 119 g.  Fruit punch.  Grapes, 46 g.  Grape juice.  Mandarin oranges (canned), 126 g.  Peach (slices), 77 g.  Pineapple (chunks), 83 g.  Raspberries, 62 g.  Red cherries (without pits), 78 g.  Strawberries (sliced), 83 g.  Watermelon (diced), 76 g. Vegetables  Alfalfa sprouts, 17 g.  Bell peppers (sliced), 46 g.  Cabbage (shredded), 35 g.  Cauliflower (boiled), 62 g.  Celery, 51 g.  Collard greens (boiled), 95 g.  Cucumber (sliced), 52 g.  Eggplant (cubed), 41 g.  Green beans (boiled), 63 g.  Lettuce (shredded), 1 c / 36 g.  Onions (sauteed), 44 g.  Radishes (sliced), 58 g.  Spaghetti squash, 51 g. Other Foods and Drinks  [employer], 1 slice / 28 g.  Black tea.  Brown rice (cooked), 98 g.  Butter croissant, 1 medium / 57 g.  Carbonated soda.  Coffee.  Cheddar cheese (diced), 66  g.  Corn flake cereal (dry), 14 g.  Cottage cheese, 118 g.  Cream of rice cereal (cooked), 122 g.  Cream of wheat cereal (cooked), 126 g.  Crisped rice cereal (dry), 14 g.  Egg (boiled, fried, poached, omelet, scrambled), 1 large / 46 61 g.  English muffin, 1 muffin / 57 g.  Frozen ice pop, 1 pop / 55 g.  Graham cracker, 1 large rectangular cracker / 14 g.  Jelly beans, 112 g.  Non-dairy whipped topping.  Oatmeal, 88 g.  Orange sherbet, 74 g.  Puffed rice cereal (dry), 7 g.  Pasta (cooked), 70 g.  Rice cakes, 4 cakes / 36 g.  Sugared doughnut, 4 oz / 116 g.  White bread, 1 slice / 30 g.  White rice (cooked), 79 93 g.  Wild rice (cooked), 82 g.  Yellow cake, 1 slice / 68 g. Document Released: 04/12/2005 Document Revised: 08/15/2012 Document Reviewed: 01/13/2012 [facility] Patient Information 2014 [place], [place].

## 2013-07-15 NOTE — Progress Notes (Signed)
  Subjective:    Patient ID: [name], male    DOB: October 11, 1934, 78 y.o.   MRN: [id number]  HPI Patient presents today with complaints of muscle cramps. He reports having these cramps in the leg and hands, with the onset being at night.     Review of Systems  Constitutional: Negative for fever and chills.  Respiratory: Negative for chest tightness and shortness of breath.   Cardiovascular: Negative for chest pain and palpitations.  Musculoskeletal:       Muscle cramps in legs and hands at night  Neurological: Negative for dizziness, weakness and headaches.       Objective:   Physical Exam  Constitutional: He is oriented to person, place, and time. He appears well-developed and well-nourished.  Cardiovascular: Normal rate, regular rhythm and normal heart sounds.   Pulmonary/Chest: Effort normal and breath sounds normal. No respiratory distress. He exhibits no tenderness.  Musculoskeletal: He exhibits no edema and no tenderness.  Neurological: He is alert and oriented to person, place, and time.  Skin: Skin is warm and dry.  Psychiatric: He has a normal mood and affect.          Assessment & Plan:  1. Muscle cramps - CMP14+EGFR - POCT CBC -discussed potential causes of muscle cramps -discussed restless leg syndrome -discussed techniques for decreasing and prevention of leg cramps -will consider restless leg syndrome if labs are normal and possibly start requip, if leg cramps unresolved -Patient verbalized understanding -Coralie Keens, FNP-C

## 2013-07-16 ENCOUNTER — Encounter: Payer: Self-pay | Admitting: Internal Medicine

## 2013-07-16 LAB — CMP14+EGFR
ALT: 9 IU/L (ref 0–44)
AST: 18 IU/L (ref 0–40)
Albumin/Globulin Ratio: 2.2 (ref 1.1–2.5)
BUN: 11 mg/dL (ref 8–27)
Calcium: 10 mg/dL (ref 8.6–10.2)
Creatinine, Ser: 0.81 mg/dL (ref 0.76–1.27)
GFR calc Af Amer: 100 mL/min/{1.73_m2} (ref >59)
GFR calc non Af Amer: 86 mL/min/{1.73_m2} (ref >59)
Globulin, Total: 1.9 g/dL (ref 1.5–4.5)
Glucose: 94 mg/dL (ref 65–99)
Potassium: 4.5 mmol/L (ref 3.5–5.2)
Total Protein: 6 g/dL (ref 6.0–8.5)

## 2013-07-22 ENCOUNTER — Ambulatory Visit (INDEPENDENT_AMBULATORY_CARE_PROVIDER_SITE_OTHER): Payer: Medicare Other | Admitting: Pharmacist

## 2013-07-22 ENCOUNTER — Ambulatory Visit: Payer: Medicare Other | Admitting: Internal Medicine

## 2013-07-22 DIAGNOSIS — I471 Supraventricular tachycardia: Secondary | ICD-10-CM

## 2013-07-22 DIAGNOSIS — I4891 Unspecified atrial fibrillation: Secondary | ICD-10-CM

## 2013-07-22 MED ORDER — WARFARIN SODIUM 2 MG PO TABS: 2.0000 mg | ORAL_TABLET | Freq: Every day | ORAL | Status: DC

## 2013-07-22 NOTE — Patient Instructions (Signed)
Anticoagulation Dose Instructions as of 07/22/2013     [name] Tue Wed Thu Fri Sat   New Dose 4 mg 4 mg 4 mg 4 mg 4 mg 4 mg 4 mg    Description       Take 2 and 1/2 tablets today (= 5mg ) then start 2 tablets (=4mg ) daily      INR was 1.1 today (too thick)

## 2013-07-31 ENCOUNTER — Ambulatory Visit (INDEPENDENT_AMBULATORY_CARE_PROVIDER_SITE_OTHER): Payer: Medicare Other | Admitting: Pharmacist

## 2013-07-31 DIAGNOSIS — I4891 Unspecified atrial fibrillation: Secondary | ICD-10-CM

## 2013-07-31 DIAGNOSIS — J441 Chronic obstructive pulmonary disease with (acute) exacerbation: Secondary | ICD-10-CM

## 2013-07-31 DIAGNOSIS — Z23 Encounter for immunization: Secondary | ICD-10-CM

## 2013-07-31 DIAGNOSIS — I471 Supraventricular tachycardia: Secondary | ICD-10-CM

## 2013-07-31 LAB — POCT INR: INR: 2.1

## 2013-07-31 MED ORDER — ALBUTEROL SULFATE 0.63 MG/3ML IN NEBU: 1.0000 | INHALATION_SOLUTION | Freq: Four times a day (QID) | RESPIRATORY_TRACT | Status: DC | PRN

## 2013-07-31 MED ORDER — MONTELUKAST SODIUM 10 MG PO TABS: 10.0000 mg | ORAL_TABLET | Freq: Every day | ORAL | Status: DC

## 2013-07-31 MED ORDER — MOMETASONE FURO-FORMOTEROL FUM 200-5 MCG/ACT IN AERO: 2.0000 | INHALATION_SPRAY | Freq: Two times a day (BID) | RESPIRATORY_TRACT | Status: DC

## 2013-07-31 MED ORDER — TIOTROPIUM BROMIDE MONOHYDRATE 18 MCG IN CAPS: 18.0000 ug | ORAL_CAPSULE | Freq: Every day | RESPIRATORY_TRACT | Status: DC

## 2013-07-31 NOTE — Patient Instructions (Signed)
Anticoagulation Dose Instructions as of 07/31/2013     [name] Tue Wed Thu Fri Sat   New Dose 4 mg 4 mg 4 mg 4 mg 4 mg 4 mg 4 mg    Description       Continue 2 tablets (=4mg ) daily       INR was 2.1 today

## 2013-08-01 ENCOUNTER — Other Ambulatory Visit: Payer: Self-pay | Admitting: General Practice

## 2013-08-01 ENCOUNTER — Ambulatory Visit: Payer: Medicare Other | Admitting: Family Medicine

## 2013-08-01 ENCOUNTER — Telehealth: Payer: Self-pay | Admitting: Family Medicine

## 2013-08-01 DIAGNOSIS — R0602 Shortness of breath: Secondary | ICD-10-CM

## 2013-08-01 DIAGNOSIS — J449 Chronic obstructive pulmonary disease, unspecified: Secondary | ICD-10-CM

## 2013-08-01 MED ORDER — ALBUTEROL SULFATE 0.63 MG/3ML IN NEBU: 1.0000 | INHALATION_SOLUTION | Freq: Four times a day (QID) | RESPIRATORY_TRACT | Status: DC | PRN

## 2013-08-01 NOTE — Telephone Encounter (Signed)
Completed diag code

## 2013-08-05 ENCOUNTER — Ambulatory Visit: Payer: Medicare Other | Admitting: Family Medicine

## 2013-08-05 ENCOUNTER — Ambulatory Visit (INDEPENDENT_AMBULATORY_CARE_PROVIDER_SITE_OTHER): Payer: Medicare Other

## 2013-08-05 ENCOUNTER — Ambulatory Visit (INDEPENDENT_AMBULATORY_CARE_PROVIDER_SITE_OTHER): Payer: Medicare Other | Admitting: Family Medicine

## 2013-08-05 ENCOUNTER — Encounter: Payer: Self-pay | Admitting: Family Medicine

## 2013-08-05 VITALS — BP 169/89 | HR 75 | Temp 97.2°F | Ht 71.0 in | Wt 172.8 lb

## 2013-08-05 DIAGNOSIS — D126 Benign neoplasm of colon, unspecified: Secondary | ICD-10-CM

## 2013-08-05 DIAGNOSIS — R05 Cough: Secondary | ICD-10-CM

## 2013-08-05 DIAGNOSIS — R059 Cough, unspecified: Secondary | ICD-10-CM

## 2013-08-05 DIAGNOSIS — I10 Essential (primary) hypertension: Secondary | ICD-10-CM

## 2013-08-05 DIAGNOSIS — E0590 Thyrotoxicosis, unspecified without thyrotoxic crisis or storm: Secondary | ICD-10-CM

## 2013-08-05 DIAGNOSIS — G459 Transient cerebral ischemic attack, unspecified: Secondary | ICD-10-CM

## 2013-08-05 DIAGNOSIS — K219 Gastro-esophageal reflux disease without esophagitis: Secondary | ICD-10-CM

## 2013-08-05 DIAGNOSIS — Z01810 Encounter for preprocedural cardiovascular examination: Secondary | ICD-10-CM

## 2013-08-05 DIAGNOSIS — E785 Hyperlipidemia, unspecified: Secondary | ICD-10-CM

## 2013-08-05 DIAGNOSIS — I440 Atrioventricular block, first degree: Secondary | ICD-10-CM

## 2013-08-05 MED ORDER — LISINOPRIL 2.5 MG PO TABS: 2.5000 mg | ORAL_TABLET | Freq: Every day | ORAL | Status: DC

## 2013-08-05 NOTE — Progress Notes (Signed)
  Subjective:    Patient ID: [name], male    DOB: 04/12/1935, 78 y.o.   MRN: [id number]  HPI  3 MONTH FOLLOW UP . PT STATES RECENTLY HAD HERNIA OPERATION.  ACCOMPANIED BY SPOUSE.             Patient Active Problem List   Diagnosis Date Noted  . Healthcare-associated pneumonia 06/07/2013  . Acute and chronic respiratory failure 06/07/2013  . Fever 06/07/2013  . Leukocytosis, unspecified 06/07/2013  . GERD (gastroesophageal reflux disease) 06/07/2013  . SIRS (systemic inflammatory response syndrome) 06/07/2013  . Chronic diastolic heart failure 06/07/2013  . Acute urinary retention 04/16/2013  . Inguinal hernia with incarceration 03/07/2013  . Benign neoplasm of colon 02/07/2013  . Colon cancer screening 02/07/2013  . Hyperthyroidism 01/28/2013  . COPD exacerbation 01/28/2013  . Anxiety state, unspecified 01/28/2013  . Arthritis 01/28/2013  . Chronic pain syndrome 01/28/2013  . Unspecified constipation 01/03/2013  . Loss of weight 01/03/2013  . Diverticulitis of colon (without mention of hemorrhage) 01/03/2013  . Other and unspecified coagulation defects 01/03/2013  . Cough 09/27/2011  . Pre-operative cardiovascular examination 06/15/2011  . AV BLOCK, 1ST DEGREE 07/13/2010  . SVT/ PSVT/ PAT 05/04/2010  . HYPERLIPIDEMIA 05/15/2009  . PAROXYSMAL ATRIAL FIBRILLATION 05/15/2009  . TIA 05/15/2009  . COPD 05/15/2009  . GERD 05/15/2009      Current outpatient prescriptions:albuterol (ACCUNEB) 0.63 MG/3ML nebulizer solution, Take 3 mLs (0.63 mg total) by nebulization every 6 (six) hours as needed for wheezing or shortness of breath., Disp: 75 mL, Rfl: 6;  mometasone-formoterol (DULERA) 200-5 MCG/ACT AERO, Inhale 2 puffs into the lungs 2 (two) times daily., Disp: 13 g, Rfl: 0;  montelukast (SINGULAIR) 10 MG tablet, Take 1 tablet (10 mg total) by mouth at bedtime., Disp: 30 tablet, Rfl: 0 roflumilast (DALIRESP) 500 MCG TABS tablet, Take 500 mcg by mouth., Disp: ,  Rfl: ;  tiotropium (SPIRIVA) 18 MCG inhalation capsule, Place 1 capsule (18 mcg total) into inhaler and inhale daily., Disp: 30 capsule, Rfl: 0;  traZODone (DESYREL) 50 MG tablet, Take 0.5-1 tablets (25-50 mg total) by mouth at bedtime as needed for sleep., Disp: 30 tablet, Rfl: 3 warfarin (COUMADIN) 2 MG tablet, Take 1-2 tablets (2-4 mg total) by mouth daily. Take one tablet on mon, wed, and Friday, and take two tablets on all other days, Disp: 60 tablet, Rfl: 1;  omeprazole (PRILOSEC) 20 MG capsule, Take 40 mg by mouth daily., Disp: , Rfl: ;  [DISCONTINUED] albuterol-ipratropium (COMBIVENT) 18-103 MCG/ACT inhaler, Inhale 1 puff into the lungs every 6 (six) hours as needed. For wheezing, Disp: , Rfl:  [DISCONTINUED] atorvastatin (LIPITOR) 20 MG tablet, Take 20 mg by mouth daily.  , Disp: , Rfl: ;  [DISCONTINUED] clonazePAM (KLONOPIN) 0.5 MG tablet, Take 0.5 mg by mouth at bedtime as needed. For sleep , Disp: , Rfl: ;  [DISCONTINUED] furosemide (LASIX) 20 MG tablet, Take 20 mg by mouth daily.  , Disp: , Rfl:     Review of Systems     Objective:   Physical Exam  BP 169/89  Pulse 75  Temp(Src) 97.2 F (36.2 C) (Oral)  Ht 5\' 11"  (1.803 m)  Wt 172 lb 12.8 oz (78.382 kg)  BMI 24.11 kg/m2       Assessment & Plan:

## 2013-08-05 NOTE — Patient Instructions (Addendum)
Atrial Fibrillation Atrial fibrillation is a type of irregular heart rhythm (arrhythmia). During atrial fibrillation, the upper chambers of the heart (atria) quiver continuously in a chaotic pattern. This causes an irregular and often rapid heart rate.  Atrial fibrillation is the result of the heart becoming overloaded with disorganized signals that tell it to beat. These signals are normally released one at a time by a part of the right atrium called the sinoatrial node. They then travel from the atria to the lower chambers of the heart (ventricles), causing the atria and ventricles to contract and pump blood as they pass. In atrial fibrillation, parts of the atria outside of the sinoatrial node also release these signals. This results in two problems. First, the atria receive so many signals that they do not have time to fully contract. Second, the ventricles, which can only receive one signal at a time, beat irregularly and out of rhythm with the atria.  There are three types of atrial fibrillation:   Paroxysmal Paroxysmal atrial fibrillation starts suddenly and stops on its own within a week.   Persistent Persistent atrial fibrillation lasts for more than a week. It may stop on its own or with treatment.   Permanent Permanent atrial fibrillation does not go away. Episodes of atrial fibrillation may lead to permanent atrial fibrillation.  Atrial fibrillation can prevent your heart from pumping blood normally. It increases your risk of stroke and can lead to heart failure.  CAUSES   Heart conditions, including a heart attack, heart failure, coronary artery disease, and heart valve conditions.   Inflammation of the sac that surrounds the heart (pericarditis).   Blockage of an artery in the lungs (pulmonary embolism).   Pneumonia or other infections.   Chronic lung disease.   Thyroid problems, especially if the thyroid is overactive (hyperthyroidism).   Caffeine, excessive alcohol  use, and use of some illegal drugs.   Use of some medications, including certain decongestants and diet pills.   Heart surgery.   Birth defects.  Sometimes, no cause can be found. When this happens, the atrial fibrillation is called lone atrial fibrillation. The risk of complications from atrial fibrillation increases if you have lone atrial fibrillation and you are age 86 years or older. RISK FACTORS  Heart failure.  Coronary artery disease  Diabetes mellitus.   High blood pressure (hypertension).   Obesity.   Other arrhythmias.   Increased age. SYMPTOMS   A feeling that your heart is beating rapidly or irregularly.   A feeling of discomfort or pain in your chest.   Shortness of breath.   Sudden lightheadedness or weakness.   Getting tired easily when exercising.   Urinating more often than normal (mainly when atrial fibrillation first begins).  In paroxysmal atrial fibrillation, symptoms may start and suddenly stop. DIAGNOSIS  Your caregiver may be able to detect atrial fibrillation when taking your pulse. Usually, testing is needed to diagnosis atrial fibrillation. Tests may include:   Electrocardiography. During this test, the electrical impulses of your heart are recorded while you are lying down.   Echocardiography. During echocardiography, sound waves are used to evaluate how blood flows through your heart.   Stress test. There is more than one type of stress test. If a stress test is needed, ask your caregiver about which type is best for you.   Chest X-ray exam.   Blood tests.   Computed tomography (CT).  TREATMENT   Treating any underlying conditions. For example, if you have an overactive  thyroid, treating the condition may correct atrial fibrillation.   Medication. Medications may be given to control a rapid heart rate or to prevent blood clots, heart failure, or a stroke.   Procedure to correct the rhythm of the  heart:  Electrical cardioversion. During electrical cardioversion, a controlled, low-energy shock is delivered to the heart through your skin. If you have chest pain, very low pressure blood pressure, or sudden heart failure, this procedure may need to be done as an emergency.  Catheter ablation. During this procedure, heart tissues that send the signals that cause atrial fibrillation are destroyed.  Maze or minimaze procedure. During this surgery, thin lines of heart tissue that carry the abnormal signals are destroyed. The maze procedure is an open-heart surgery. The minimaze procedure is a minimally invasive surgery. This means that small cuts are made to access the heart instead of a large opening.  Pulmonary venous isolation. During this surgery, tissue around the veins that carry blood from the lungs (pulmonary veins) is destroyed. This tissue is thought to carry the abnormal signals. HOME CARE INSTRUCTIONS   Take medications as directed by your caregiver.  Only take medications that your caregiver approves. Some medications can make atrial fibrillation worse or recur.  If blood thinners were prescribed by your caregiver, take them exactly as directed. Too much can cause bleeding. Too little and you will not have the needed protection against stroke and other problems.  Perform blood tests at home if directed by your caregiver.  Perform blood tests exactly as directed.   Quit smoking if you smoke.   Do not drink alcohol.   Do not drink caffeinated beverages such as coffee, soda, and some teas. You may drink decaffeinated coffee, soda, or tea.   Maintain a healthy weight. Do not use diet pills unless your caregiver approves. They may make heart problems worse.   Follow diet instructions as directed by your caregiver.   Exercise regularly as directed by your caregiver.   Keep all follow-up appointments. PREVENTION  The following substances can cause atrial fibrillation  to recur:   Caffeinated beverages.   Alcohol.   Certain medications, especially those used for breathing problems.   Certain herbs and herbal medications, such as those containing ephedra or ginseng.  Illegal drugs such as cocaine and amphetamines. Sometimes medications are given to prevent atrial fibrillation from recurring. Proper treatment of any underlying condition is also important in helping prevent recurrence.  SEEK MEDICAL CARE IF:  You notice a change in the rate, rhythm, or strength of your heartbeat.   You suddenly begin urinating more frequently.   You tire more easily when exerting yourself or exercising.  SEEK IMMEDIATE MEDICAL CARE IF:   You develop chest pain, abdominal pain, sweating, or weakness.  You feel sick to your stomach (nauseous).  You develop shortness of breath.  You suddenly develop swollen feet and ankles.  You feel dizzy.  You face or limbs feel numb or weak.  There is a change in your vision or speech. MAKE SURE YOU:   Understand these instructions.  Will watch your condition.  Will get help right away if you are not doing well or get worse. Document Released: 08/29/2005 Document Revised: 12/24/2012 Document Reviewed: 10/09/2012 [facility] Patient Information 2014 [place], [place].   Anticoagulation, Generic Anticoagulants are medications used to prevent clots from developing in your veins. These medications are also known as blood thinners. If blood clots are untreated, they could travel to your lungs. This is called  a pulmonary embolus. A blood clot in your lungs can be fatal.  Caregivers often use anticoagulants to prevent clots following surgery. Anticoagulants are also used along with aspirin when the heart is not getting enough blood. Another anticoagulant called warfarin is started 2 to 3 days after a rapid-acting injectable anticoagulant is started. The rapid-acting anticoagulants are usually continued until warfarin has  begun to work. Your caregiver will judge this length of time by blood tests known as the prothrombin time (PT) and International Normalization Ratio (INR). This means that your blood is at the necessary and best level to prevent clots. RISKS AND COMPLICATIONS  If you have received recent epidural anesthesia, spinal anesthesia, or a spinal tap while receiving anticoagulants, you are at risk for developing a blood clot in or around the spine. This condition could result in long-term or permanent paralysis.  Because anticoagulants thin your blood, severe bleeding may occur from any tissue or organ. Symptoms of the blood being too thin may include:  Bleeding from the nose or gums that does not stop quickly.  Unusual bruising or bruising easily.  Swelling or pain at an injection site.  A cut that does not stop bleeding within 10 minutes.  Continual nausea for more than 1 day or vomiting blood.  Coughing up blood.  Blood in the urine which may appear as pink, red, or brown urine.  Blood in bowel movements which may appear as red, dark or black stools.  Sudden weakness or numbness of the face, arm, or leg, especially on one side of the body.  Sudden confusion.  Trouble speaking (aphasia) or understanding.  Sudden trouble seeing in one or both eyes.  Sudden trouble walking.  Dizziness.  Loss of balance or coordination.  Severe pain, such as a headache, joint pain, or back pain.  Fever.  Too little anticoagulation continues to allow the risk for blood clots. HOME CARE INSTRUCTIONS   Due to the complications of anticoagulants, it is very important that you take your anticoagulant as directed by your caregiver. Anticoagulants need to be taken exactly as instructed. Be sure you understand all your anticoagulant instructions.  Warfarin. Your caregiver will advise you on the length of treatment (usually 3 6 months, sometimes lifelong).  Take warfarin exactly as directed by your  caregiver. It is recommended that you take your warfarin dose at the same time of the day. It is preferred that you take warfarin in the late afternoon. If you have been told to stop taking warfarin, do not resume taking warfarin until directed to do so by your caregiver. Follow your caregiver's instructions if you accidentally take an extra dose or miss a dose of warfarin. It is very important to take warfarin as directed since bleeding or blood clots could result in chronic or permanent injury, pain, or disability.  Too much and too little warfarin are both dangerous. Too much warfarin increases the risk of bleeding. Too little warfarin continues to allow the risk for blood clots. While taking warfarin, you will need to have regular blood tests to measure your blood clotting time. These blood tests usually include both the PT and INR tests. The PT and INR results allow your caregiver to adjust your dose of warfarin. The dose can change for many reasons. It is critically important that you take warfarin exactly as prescribed, and that you have your PT and INR levels drawn exactly as directed. Follow up with your laboratory test appointments as directed. It is very important to  keep your lab appointments. Not keeping lab appointments could result in a chronic or permanent injury, pain, or disability.  Many foods, especially foods high in vitamin K can interfere with warfarin and affect the PT and INR results. Foods high in vitamin K include spinach, kale, broccoli, cabbage, collard and turnip greens, brussels sprouts, peas, cauliflower, seaweed, and parsley as well as beef and pork liver, green tea, and soybean oil. You should eat a consistent amount of foods high in vitamin K. Avoid major changes in your diet, or notify your caregiver before changing your diet. Arrange a visit with a dietitian to answer your questions.  Many medicines can interfere with warfarin and affect the PT and INR results. You must  tell your caregiver about any and all medicines you take, this includes all vitamins and supplements. Ask your caregiver before taking these. Prescription and over-the-counter medicine consistency is critical to warfarin management. It is important that potential interactions are checked before you start a new medicine. Be especially cautious with aspirin and anti-inflammatory medicines. Ask your caregiver before taking these. Medicines such as antibiotics and acid-reducing medicine can interact with warfarin and can cause an increased warfarin effect. Warfarin can also interfere with the effectiveness of medicines you are taking. Do not take or discontinue any prescribed or over-the-counter medicine except on the advice of your caregiver or pharmacist.  Some vitamins, supplements, and herbal products interfere with the effectiveness of warfarin. Vitamin E may increase the anticoagulant effects of warfarin. Vitamin K may can cause warfarin to be less effective. Do not take or discontinue any vitamin, supplement, or herbal product except on the advice of your caregiver or pharmacist.  Alcohol can change the body's ability to handle warfarin. It is best to avoid alcoholic drinks or consume only very small amounts while taking warfarin. Notify your caregiver if you change your alcohol intake. A sudden increase in alcohol use can increase your risk of bleeding. Chronic alcohol use can cause warfarin to be less effective.  If you have a loss of appetite or get the stomach flu (viral gastroenteritis), talk to your caregiver as soon as possible. A decrease in your normal vitamin K intake can make you more sensitive to your usual dose of warfarin.  Some medical conditions may increase your risk for bleeding while you are taking warfarin. A fever, diarrhea lasting more than a day, worsening heart failure, or worsening liver function are some medical conditions that could affect warfarin. Contact your caregiver if you  have any of these medical conditions.  Warfarin can have side effects, such as excessive bruising or bleeding. You will need to hold pressure over cuts for longer than usual.  Be careful not to cut yourself when using sharp objects.  Notify your dentist or other caregivers before procedures.  Limit physical activities or sports that could result in a fall or cause injury. Avoid contact sports.  Wear a medical alert bracelet or carry a medical alert card. SEEK MEDICAL CARE IF:   You develop any rashes.  You have any worsening of the condition for which you are receiving anticoagulation therapy. SEEK IMMEDIATE MEDICAL CARE IF:   Bleeding from the nose or gums does not stop quickly.  You have unusual bruising or are bruising easily.  Swelling or pain occurs at an injection site.  A cut does not stop bleeding within 10 minutes.  You have continual nausea for more than 1 day or are vomiting blood.  You are coughing up blood.  You have blood in the urine.  You have dark or black stools.  You have sudden weakness or numbness of the face, arm, or leg, especially on one side of the body.  You have sudden confusion.  You have trouble speaking (aphasia) or understanding.  You have sudden trouble seeing in one or both eyes.  You have sudden trouble walking.  You have dizziness.  You have a loss of balance or coordination.  You have severe pain, such as a headache, joint pain, or back pain.  You have a serious fall or head injury, even if you are not bleeding.  You have an oral temperature above 102 F (38.9 C), not controlled by medicine. ANY OF THESE SYMPTOMS MAY REPRESENT A SERIOUS PROBLEM THAT IS AN EMERGENCY. Do not wait to see if the symptoms will go away. Get medical help right away. Call your local emergency services (911 in [place] DO NOT drive yourself to the hospital. MAKE SURE YOU:   Understand these instructions.  Will watch your condition.  Will get help  right away if you are not doing well or get worse. Document Released: 08/29/2005 Document Revised: 05/23/2012 Document Reviewed: 04/02/2008 [facility] Patient Information 2014 [place], [place].   DASH Diet The DASH diet stands for "Dietary Approaches to Stop Hypertension." It is a healthy eating plan that has been shown to reduce high blood pressure (hypertension) in as little as 14 days, while also possibly providing other significant health benefits. These other health benefits include reducing the risk of breast cancer after menopause and reducing the risk of type 2 diabetes, heart disease, colon cancer, and stroke. Health benefits also include weight loss and slowing kidney failure in patients with chronic kidney disease.  DIET GUIDELINES  Limit salt (sodium). Your diet should contain less than 1500 mg of sodium daily.  Limit refined or processed carbohydrates. Your diet should include mostly whole grains. Desserts and added sugars should be used sparingly.  Include small amounts of heart-healthy fats. These types of fats include nuts, oils, and tub margarine. Limit saturated and trans fats. These fats have been shown to be harmful in the body. CHOOSING FOODS  The following food groups are based on a 2000 calorie diet. See your Registered Dietitian for individual calorie needs. Grains and Grain Products (6 to 8 servings daily)  Eat More Often: Whole-wheat bread, brown rice, whole-grain or wheat pasta, quinoa, popcorn without added fat or salt (air popped).  Eat Less Often: White bread, white pasta, white rice, cornbread. Vegetables (4 to 5 servings daily)  Eat More Often: Fresh, frozen, and canned vegetables. Vegetables may be raw, steamed, roasted, or grilled with a minimal amount of fat.  Eat Less Often/Avoid: Creamed or fried vegetables. Vegetables in a cheese sauce. Fruit (4 to 5 servings daily)  Eat More Often: All fresh, canned (in natural juice), or frozen fruits. Dried fruits  without added sugar. One hundred percent fruit juice ( cup [237 mL] daily).  Eat Less Often: Dried fruits with added sugar. Canned fruit in light or heavy syrup. [employer], Fish, and Poultry (2 servings or less daily. One serving is 3 to 4 oz [85-114 g]).  Eat More Often: Ninety percent or leaner ground beef, tenderloin, sirloin. Round cuts of beef, chicken breast, [place] breast. All fish. Grill, bake, or broil your meat. Nothing should be fried.  Eat Less Often/Avoid: Fatty cuts of meat, [place], or chicken leg, thigh, or wing. Fried cuts of meat or fish. Dairy (2 to 3 servings)  Eat More Often: Low-fat or fat-free milk, low-fat plain or light yogurt, reduced-fat or part-skim cheese.  Eat Less Often/Avoid: Milk (whole, 2%).Whole milk yogurt. Full-fat cheeses. Nuts, Seeds, and Legumes (4 to 5 servings per week)  Eat More Often: All without added salt.  Eat Less Often/Avoid: Salted nuts and seeds, canned beans with added salt. Fats and Sweets (limited)  Eat More Often: Vegetable oils, tub margarines without trans fats, sugar-free gelatin. Mayonnaise and salad dressings.  Eat Less Often/Avoid: Coconut oils, palm oils, butter, stick margarine, cream, half and half, cookies, candy, pie. FOR MORE INFORMATION The Dash Diet Eating Plan: www.dashdiet.org Document Released: 08/18/2011 Document Revised: 11/21/2011 Document Reviewed: 08/18/2011 [facility] Patient Information 2014 [place], [place].   Hypertension As your heart beats, it forces blood through your arteries. This force is your blood pressure. If the pressure is too high, it is called hypertension (HTN) or high blood pressure. HTN is dangerous because you may have it and not know it. High blood pressure may mean that your heart has to work harder to pump blood. Your arteries may be narrow or stiff. The extra work puts you at risk for heart disease, stroke, and other problems.  Blood pressure consists of two numbers, a higher number  over a lower, 110/72, for example. It is stated as "110 over 72." The ideal is below 120 for the top number (systolic) and under 80 for the bottom (diastolic). Write down your blood pressure today. You should pay close attention to your blood pressure if you have certain conditions such as:  Heart failure.  Prior heart attack.  Diabetes  Chronic kidney disease.  Prior stroke.  Multiple risk factors for heart disease. To see if you have HTN, your blood pressure should be measured while you are seated with your arm held at the level of the heart. It should be measured at least twice. A one-time elevated blood pressure reading (especially in the Emergency Department) does not mean that you need treatment. There may be conditions in which the blood pressure is different between your right and left arms. It is important to see your caregiver soon for a recheck. Most people have essential hypertension which means that there is not a specific cause. This type of high blood pressure may be lowered by changing lifestyle factors such as:  Stress.  Smoking.  Lack of exercise.  Excessive weight.  Drug/tobacco/alcohol use.  Eating less salt. Most people do not have symptoms from high blood pressure until it has caused damage to the body. Effective treatment can often prevent, delay or reduce that damage. TREATMENT  When a cause has been identified, treatment for high blood pressure is directed at the cause. There are a large number of medications to treat HTN. These fall into several categories, and your caregiver will help you select the medicines that are best for you. Medications may have side effects. You should review side effects with your caregiver. If your blood pressure stays high after you have made lifestyle changes or started on medicines,   Your medication(s) may need to be changed.  Other problems may need to be addressed.  Be certain you understand your prescriptions, and know  how and when to take your medicine.  Be sure to follow up with your caregiver within the time frame advised (usually within two weeks) to have your blood pressure rechecked and to review your medications.  If you are taking more than one medicine to lower your blood pressure, make sure you know  how and at what times they should be taken. Taking two medicines at the same time can result in blood pressure that is too low. SEEK IMMEDIATE MEDICAL CARE IF:  You develop a severe headache, blurred or changing vision, or confusion.  You have unusual weakness or numbness, or a faint feeling.  You have severe chest or abdominal pain, vomiting, or breathing problems. MAKE SURE YOU:   Understand these instructions.  Will watch your condition.  Will get help right away if you are not doing well or get worse. Document Released: 08/29/2005 Document Revised: 11/21/2011 Document Reviewed: 04/18/2008 [facility] Patient Information 2014 [place], [place].

## 2013-08-05 NOTE — Progress Notes (Signed)
Patient ID: [name], male   DOB: 1935-06-26, 78 y.o.   MRN: [id number] SUBJECTIVE: CC: Chief Complaint  Patient presents with  . Follow-up    3 MONTH FOLLOW UP . PER PT RECENTLY HAD HERNIA OPERATIO     HPI: Doing well. Has a non-tender lump at the site of the left inguinal hernia. Doing very well so far. Has a chronic cough. No hemoptysis. Has this for years. Newly married and his wife notices the smokers cough. He has not had a Chest Xray in a while. No fever no SOB no wheezes.  TIA: stable no new symptoms.  Atrial fibrillation: stable and follow up in the anticoag clinic  COPD stable.  Past Medical History  Diagnosis Date  . Atrial fibrillation     coumadin therapy;  echo 1/10: EF 65-70%, mild diast dysfxn  . Hyperlipidemia   . GERD (gastroesophageal reflux disease)   . Allergic rhinitis   . Asthma   . Anxiety   . CAD (coronary artery disease)     cath 7/09: pCFX 25%, mRCA 25%, EF 60%;  Myoview 6/11: low risk, EF 67%  . History of pneumonia   . Sebaceous cyst     scalp  . SVT (supraventricular tachycardia)     s/p RFCA 05/2010  . First degree AV block   . History of TIAs   . Stroke sev yrs ago    TIA  . Hiatal hernia   . COPD (chronic obstructive pulmonary disease)     admitted 5/11, uses oxygen 2 liters/min prn rare use  . Emphysema   . Tubular adenoma 2014    Dr. Rhea Belton  . Hypertension    Past Surgical History  Procedure Laterality Date  . Knee surgery Right 1981  . Cystectomy  2012    removed from head  . Colonoscopy N/A 02/07/2013    Procedure: COLONOSCOPY;  Surgeon: Beverley Fiedler, MD;  Location: [facility];  Service: Gastroenterology;  Laterality: N/A;  . Esophagogastroduodenoscopy N/A 02/07/2013    Procedure: ESOPHAGOGASTRODUODENOSCOPY (EGD);  Surgeon: Beverley Fiedler, MD;  Location: [facility];  Service: Gastroenterology;  Laterality: N/A;  . Back surgery  1980    lower L 4 to L 5  . Cardiac electrophysiology mapping and ablation  2008  .  Inguinal hernia repair Right yrs ago  . Cardiac valve surgery      per epic, pt denies  . Inguinal hernia repair Left 04/15/2013    Procedure: LEFT HERNIA REPAIR INGUINAL INCARCERATED;  Surgeon: [name], MD;  Location: [facility];  Service: General;  Laterality: Left;  . Insertion of mesh Left 04/15/2013    Procedure: INSERTION OF MESH;  Surgeon: [name], MD;  Location: [facility];  Service: General;  Laterality: Left;   History   Social History  . Marital Status: Widowed    Spouse Name: N/A    Number of Children: 5  . Years of Education: N/A   Occupational History  . retired    Social History Main Topics  . Smoking status: Former Smoker -- 3.00 packs/day for 40 years    Types: Cigarettes    Quit date: 09/13/1995  . Smokeless tobacco: Never Used  . Alcohol Use: No  . Drug Use: No  . Sexual Activity: Not on file   Other Topics Concern  . Not on file   Social History Narrative  . No narrative on file   Family History  Problem Relation Age of Onset  . Hypertension  Mother   . COPD Father   . COPD Sister   . Cancer Brother     lung  . Lung cancer Mother    Current Outpatient Prescriptions on File Prior to Visit  Medication Sig Dispense Refill  . albuterol (ACCUNEB) 0.63 MG/3ML nebulizer solution Take 3 mLs (0.63 mg total) by nebulization every 6 (six) hours as needed for wheezing or shortness of breath.  75 mL  6  . mometasone-formoterol (DULERA) 200-5 MCG/ACT AERO Inhale 2 puffs into the lungs 2 (two) times daily.  13 g  0  . montelukast (SINGULAIR) 10 MG tablet Take 1 tablet (10 mg total) by mouth at bedtime.  30 tablet  0  . tiotropium (SPIRIVA) 18 MCG inhalation capsule Place 1 capsule (18 mcg total) into inhaler and inhale daily.  30 capsule  0  . traZODone (DESYREL) 50 MG tablet Take 0.5-1 tablets (25-50 mg total) by mouth at bedtime as needed for sleep.  30 tablet  3  . warfarin (COUMADIN) 2 MG tablet Take 1-2 tablets (2-4 mg total) by mouth daily. Take one tablet  on mon, wed, and Friday, and take two tablets on all other days  60 tablet  1  . omeprazole (PRILOSEC) 20 MG capsule Take 40 mg by mouth daily.      . [DISCONTINUED] albuterol-ipratropium (COMBIVENT) 18-103 MCG/ACT inhaler Inhale 1 puff into the lungs every 6 (six) hours as needed. For wheezing      . [DISCONTINUED] atorvastatin (LIPITOR) 20 MG tablet Take 20 mg by mouth daily.        . [DISCONTINUED] clonazePAM (KLONOPIN) 0.5 MG tablet Take 0.5 mg by mouth at bedtime as needed. For sleep       . [DISCONTINUED] furosemide (LASIX) 20 MG tablet Take 20 mg by mouth daily.         No current facility-administered medications on file prior to visit.   No Known Allergies Immunization History  Administered Date(s) Administered  . Influenza Split 06/13/2011  . Influenza,inj,Quad PF,36+ Mos 07/31/2013  . Pneumococcal Polysaccharide-23 06/12/2010   Prior to Admission medications   Medication Sig Start Date End Date Taking? Authorizing Provider  albuterol (ACCUNEB) 0.63 MG/3ML nebulizer solution Take 3 mLs (0.63 mg total) by nebulization every 6 (six) hours as needed for wheezing or shortness of breath. 08/01/13  Yes Mae E Haliburton, FNP  mometasone-formoterol (DULERA) 200-5 MCG/ACT AERO Inhale 2 puffs into the lungs 2 (two) times daily. 07/31/13  Yes [name], MD  montelukast (SINGULAIR) 10 MG tablet Take 1 tablet (10 mg total) by mouth at bedtime. 07/31/13  Yes [name], MD  roflumilast (DALIRESP) 500 MCG TABS tablet Take 500 mcg by mouth.   Yes Historical Provider, MD  tiotropium (SPIRIVA) 18 MCG inhalation capsule Place 1 capsule (18 mcg total) into inhaler and inhale daily. 07/31/13  Yes [name], MD  traZODone (DESYREL) 50 MG tablet Take 0.5-1 tablets (25-50 mg total) by mouth at bedtime as needed for sleep. 06/28/13  Yes Deatra Canter, FNP  warfarin (COUMADIN) 2 MG tablet Take 1-2 tablets (2-4 mg total) by mouth daily. Take one tablet on mon, wed, and Friday, and take two  tablets on all other days 07/22/13  Yes Tammy Eckard, PHARMD  omeprazole (PRILOSEC) 20 MG capsule Take 40 mg by mouth daily.    Historical Provider, MD     ROS: As above in the HPI. All other systems are stable or negative.  OBJECTIVE: APPEARANCE:  Patient in no acute  distress.The patient appeared well nourished and normally developed. Acyanotic. Waist: VITAL SIGNS:BP 169/89  Pulse 75  Temp(Src) 97.2 F (36.2 C) (Oral)  Ht 5\' 11"  (1.803 m)  Wt 172 lb 12.8 oz (78.382 kg)  BMI 24.11 kg/m2 Elderly WM  SKIN: warm and  Dry without overt rashes, tattoos and scars  HEAD and Neck: without JVD, Head and scalp: normal Eyes:No scleral icterus. Fundi normal, eye movements normal. Ears: Auricle normal, canal normal, Tympanic membranes normal, insufflation normal. Nose: normal Throat: normal Neck & thyroid: normal  CHEST & LUNGS: Chest wall: normal Lungs: Clear  CVS: Reveals the PMI to be normally located. Regular rhythm, First and Second Heart sounds are normal,  absence of murmurs, rubs or gallops. Peripheral vasculature: Radial pulses: normal Dorsal pedis pulses: normal Posterior pulses: normal  ABDOMEN:  Appearance: normal Benign, no organomegaly, no masses, no Abdominal Aortic enlargement. No Guarding , no rebound. No Bruits. Bowel sounds: normal  RECTAL: N/A GU: left inguinal scar . Thickness. Stable. Area of lump his his pelvic bone and scar.  EXTREMETIES: nonedematous.  MUSCULOSKELETAL:  Spine: normal Joints: intact  NEUROLOGIC: oriented to time,place and person; nonfocal.  Results for orders placed in visit on 07/31/13  POCT INR      Result Value Range   INR 2.1      ASSESSMENT: AV BLOCK, 1ST DEGREE  Pre-operative cardiovascular examination  Hyperthyroidism - Plan: Thyroid Panel With TSH  Benign neoplasm of colon  TIA  HYPERLIPIDEMIA  GERD  Cough - Plan: DG Chest 2 View  HTN (hypertension) - Plan: lisinopril (PRINIVIL,ZESTRIL) 2.5 MG  tablet  PLAN:  Orders Placed This Encounter  Procedures  . DG Chest 2 View    Standing Status: Future     Number of Occurrences: 1     Standing Expiration Date: 10/05/2014    Order Specific Question:  Reason for Exam (SYMPTOM  OR DIAGNOSIS REQUIRED)    Answer:  cough    Order Specific Question:  Preferred imaging location?    Answer:  Internal  . Thyroid Panel With TSH   Meds ordered this encounter  Medications  . roflumilast (DALIRESP) 500 MCG TABS tablet    Sig: Take 500 mcg by mouth.  Marland Kitchen lisinopril (PRINIVIL,ZESTRIL) 2.5 MG tablet    Sig: Take 1 tablet (2.5 mg total) by mouth daily.    Dispense:  30 tablet    Refill:  5   Medications Discontinued During This Encounter  Medication Reason  . predniSONE (DELTASONE) 10 MG tablet Completed Course    [facility] reading (PRIMARY) by  Dr. Modesto Charon  : chronic changes, COPD, scarring. Residual scars from recent LLL infiltrate.                             Handout in the AVS on HTN/DASH diet and atrial fibrillation. Discussed with his new wife of 1 month his medical problems with his consent. He is stable except for the BP running higher than I would accept this time and his BP fluctuates.   Return in about 1 week (around 08/12/2013) for  recheck BP.  Vineta Carone P. Modesto Charon, M.D.

## 2013-08-06 LAB — THYROID PANEL WITH TSH
Free Thyroxine Index: 2.7 (ref 1.2–4.9)
T3 Uptake Ratio: 33 % (ref 24–39)
T4, Total: 8.2 ug/dL (ref 4.5–12.0)
TSH: 0.874 u[IU]/mL (ref 0.450–4.500)

## 2013-08-07 NOTE — Progress Notes (Signed)
Quick Note:  Call patient. Labs normal. No change in plan. ______ 

## 2013-08-12 ENCOUNTER — Ambulatory Visit: Payer: Medicare Other | Admitting: Family Medicine

## 2013-08-13 NOTE — Telephone Encounter (Signed)
This encounter was created in error - please disregard.

## 2013-08-15 ENCOUNTER — Encounter: Payer: Self-pay | Admitting: Family Medicine

## 2013-08-15 ENCOUNTER — Ambulatory Visit (INDEPENDENT_AMBULATORY_CARE_PROVIDER_SITE_OTHER): Payer: Medicare Other | Admitting: Family Medicine

## 2013-08-15 VITALS — BP 150/71 | HR 71 | Temp 97.0°F | Ht 71.0 in | Wt 173.2 lb

## 2013-08-15 DIAGNOSIS — M129 Arthropathy, unspecified: Secondary | ICD-10-CM

## 2013-08-15 DIAGNOSIS — G894 Chronic pain syndrome: Secondary | ICD-10-CM

## 2013-08-15 DIAGNOSIS — E0590 Thyrotoxicosis, unspecified without thyrotoxic crisis or storm: Secondary | ICD-10-CM

## 2013-08-15 DIAGNOSIS — I440 Atrioventricular block, first degree: Secondary | ICD-10-CM

## 2013-08-15 DIAGNOSIS — I10 Essential (primary) hypertension: Secondary | ICD-10-CM

## 2013-08-15 DIAGNOSIS — G459 Transient cerebral ischemic attack, unspecified: Secondary | ICD-10-CM

## 2013-08-15 DIAGNOSIS — E785 Hyperlipidemia, unspecified: Secondary | ICD-10-CM

## 2013-08-15 DIAGNOSIS — M1990 Unspecified osteoarthritis, unspecified site: Secondary | ICD-10-CM

## 2013-08-15 MED ORDER — LISINOPRIL 5 MG PO TABS: 5.0000 mg | ORAL_TABLET | Freq: Every day | ORAL | Status: DC

## 2013-08-15 NOTE — Progress Notes (Signed)
Patient ID: [name], male   DOB: 02-07-1935, 78 y.o.   MRN: [id number] SUBJECTIVE: CC: Chief Complaint  Patient presents with  . Follow-up    RECK BP    HPI: Patient is here for follow up of hypertension: denies Headache;deniesChest Pain;denies weakness;denies Shortness of Breath or Orthopnea;denies Visual changes;denies palpitations;denies cough;denies pedal edema;denies symptoms of TIA or stroke; admits to Compliance with medications. denies Problems with medications. Doing well and feeling good  Past Medical History  Diagnosis Date  . Atrial fibrillation     coumadin therapy;  echo 1/10: EF 65-70%, mild diast dysfxn  . Hyperlipidemia   . GERD (gastroesophageal reflux disease)   . Allergic rhinitis   . Asthma   . Anxiety   . CAD (coronary artery disease)     cath 7/09: pCFX 25%, mRCA 25%, EF 60%;  Myoview 6/11: low risk, EF 67%  . History of pneumonia   . Sebaceous cyst     scalp  . SVT (supraventricular tachycardia)     s/p RFCA 05/2010  . First degree AV block   . History of TIAs   . Stroke sev yrs ago    TIA  . Hiatal hernia   . COPD (chronic obstructive pulmonary disease)     admitted 5/11, uses oxygen 2 liters/min prn rare use  . Emphysema   . Tubular adenoma 2014    Dr. Rhea Belton  . Hypertension    Past Surgical History  Procedure Laterality Date  . Knee surgery Right 1981  . Cystectomy  2012    removed from head  . Colonoscopy N/A 02/07/2013    Procedure: COLONOSCOPY;  Surgeon: Beverley Fiedler, MD;  Location: [facility];  Service: Gastroenterology;  Laterality: N/A;  . Esophagogastroduodenoscopy N/A 02/07/2013    Procedure: ESOPHAGOGASTRODUODENOSCOPY (EGD);  Surgeon: Beverley Fiedler, MD;  Location: [facility];  Service: Gastroenterology;  Laterality: N/A;  . Back surgery  1980    lower L 4 to L 5  . Cardiac electrophysiology mapping and ablation  2008  . Inguinal hernia repair Right yrs ago  . Cardiac valve surgery      per epic, pt denies  .  Inguinal hernia repair Left 04/15/2013    Procedure: LEFT HERNIA REPAIR INGUINAL INCARCERATED;  Surgeon: [name], MD;  Location: [facility];  Service: General;  Laterality: Left;  . Insertion of mesh Left 04/15/2013    Procedure: INSERTION OF MESH;  Surgeon: [name], MD;  Location: [facility];  Service: General;  Laterality: Left;   History   Social History  . Marital Status: Widowed    Spouse Name: N/A    Number of Children: 5  . Years of Education: N/A   Occupational History  . retired    Social History Main Topics  . Smoking status: Former Smoker -- 3.00 packs/day for 40 years    Types: Cigarettes    Quit date: 09/13/1995  . Smokeless tobacco: Never Used  . Alcohol Use: No  . Drug Use: No  . Sexual Activity: Not on file   Other Topics Concern  . Not on file   Social History Narrative  . No narrative on file   Family History  Problem Relation Age of Onset  . Hypertension Mother   . COPD Father   . COPD Sister   . Cancer Brother     lung  . Lung cancer Mother    Current Outpatient Prescriptions on File Prior to Visit  Medication Sig Dispense Refill  .  albuterol (ACCUNEB) 0.63 MG/3ML nebulizer solution Take 3 mLs (0.63 mg total) by nebulization every 6 (six) hours as needed for wheezing or shortness of breath.  75 mL  6  . mometasone-formoterol (DULERA) 200-5 MCG/ACT AERO Inhale 2 puffs into the lungs 2 (two) times daily.  13 g  0  . montelukast (SINGULAIR) 10 MG tablet Take 1 tablet (10 mg total) by mouth at bedtime.  30 tablet  0  . omeprazole (PRILOSEC) 20 MG capsule Take 40 mg by mouth daily.      . roflumilast (DALIRESP) 500 MCG TABS tablet Take 500 mcg by mouth.      . tiotropium (SPIRIVA) 18 MCG inhalation capsule Place 1 capsule (18 mcg total) into inhaler and inhale daily.  30 capsule  0  . traZODone (DESYREL) 50 MG tablet Take 0.5-1 tablets (25-50 mg total) by mouth at bedtime as needed for sleep.  30 tablet  3  . warfarin (COUMADIN) 2 MG tablet Take 1-2  tablets (2-4 mg total) by mouth daily. Take one tablet on mon, wed, and Friday, and take two tablets on all other days  60 tablet  1  . [DISCONTINUED] albuterol-ipratropium (COMBIVENT) 18-103 MCG/ACT inhaler Inhale 1 puff into the lungs every 6 (six) hours as needed. For wheezing      . [DISCONTINUED] atorvastatin (LIPITOR) 20 MG tablet Take 20 mg by mouth daily.        . [DISCONTINUED] clonazePAM (KLONOPIN) 0.5 MG tablet Take 0.5 mg by mouth at bedtime as needed. For sleep       . [DISCONTINUED] furosemide (LASIX) 20 MG tablet Take 20 mg by mouth daily.         No current facility-administered medications on file prior to visit.   No Known Allergies Immunization History  Administered Date(s) Administered  . Influenza Split 06/13/2011  . Influenza,inj,Quad PF,36+ Mos 07/31/2013  . Pneumococcal Polysaccharide-23 06/12/2010   Prior to Admission medications   Medication Sig Start Date End Date Taking? Authorizing Provider  albuterol (ACCUNEB) 0.63 MG/3ML nebulizer solution Take 3 mLs (0.63 mg total) by nebulization every 6 (six) hours as needed for wheezing or shortness of breath. 08/01/13   Coralie Keens, FNP  lisinopril (PRINIVIL,ZESTRIL) 2.5 MG tablet Take 1 tablet (2.5 mg total) by mouth daily. 08/05/13   Ileana Ladd, MD  mometasone-formoterol (DULERA) 200-5 MCG/ACT AERO Inhale 2 puffs into the lungs 2 (two) times daily. 07/31/13   [name], MD  montelukast (SINGULAIR) 10 MG tablet Take 1 tablet (10 mg total) by mouth at bedtime. 07/31/13   [name], MD  omeprazole (PRILOSEC) 20 MG capsule Take 40 mg by mouth daily.    Historical Provider, MD  roflumilast (DALIRESP) 500 MCG TABS tablet Take 500 mcg by mouth.    Historical Provider, MD  tiotropium (SPIRIVA) 18 MCG inhalation capsule Place 1 capsule (18 mcg total) into inhaler and inhale daily. 07/31/13   [name], MD  traZODone (DESYREL) 50 MG tablet Take 0.5-1 tablets (25-50 mg total) by mouth at bedtime as needed for  sleep. 06/28/13   Deatra Canter, FNP  warfarin (COUMADIN) 2 MG tablet Take 1-2 tablets (2-4 mg total) by mouth daily. Take one tablet on mon, wed, and Friday, and take two tablets on all other days 07/22/13   Tammy Eckard, PHARMD     ROS: As above in the HPI. All other systems are stable or negative.  OBJECTIVE: APPEARANCE:  Patient in no acute distress.The patient appeared well nourished  and normally developed. Acyanotic. Waist: VITAL SIGNS:BP 150/71  Pulse 71  Temp(Src) 97 F (36.1 C) (Oral)  Ht 5\' 11"  (1.803 m)  Wt 173 lb 3.2 oz (78.563 kg)  BMI 24.17 kg/m2  WM  SKIN: warm and  Dry without overt rashes, tattoos and scars  HEAD and Neck: without JVD, Head and scalp: normal Eyes:No scleral icterus. Fundi normal, eye movements normal. Ears: Auricle normal, canal normal, Tympanic membranes normal, insufflation normal. Nose: normal Throat: normal Neck & thyroid: normal  CHEST & LUNGS: Chest wall: normal Lungs: Clear  CVS: Reveals the PMI to be normally located. Regular rhythm, First and Second Heart sounds are normal,  absence of murmurs, rubs or gallops. Peripheral vasculature: Radial pulses: normal Dorsal pedis pulses: normal Posterior pulses: normal  ABDOMEN:  Appearance: normal Benign, no organomegaly, no masses, no Abdominal Aortic enlargement. No Guarding , no rebound. No Bruits. Bowel sounds: normal  RECTAL: N/A GU: N/A  EXTREMETIES: nonedematous. NEUROLOGIC: oriented to time,place and person; nonfocal. Strength is normal Sensory is normal Reflexes are normal Cranial Nerves are normal.  ASSESSMENT:  HTN (hypertension) - Plan: lisinopril (PRINIVIL,ZESTRIL) 5 MG tablet  AV BLOCK, 1ST DEGREE  Hypertension  Hyperthyroidism  TIA  Arthritis  Chronic pain syndrome  HYPERLIPIDEMIA BP is better and improving  PLAN:  No orders of the defined types were placed in this encounter.   Meds ordered this encounter  Medications  .  lisinopril (PRINIVIL,ZESTRIL) 5 MG tablet    Sig: Take 1 tablet (5 mg total) by mouth daily.    Dispense:  30 tablet    Refill:  5   Medications Discontinued During This Encounter  Medication Reason  . lisinopril (PRINIVIL,ZESTRIL) 2.5 MG tablet Reorder   Return in about 2 months (around 10/16/2013) for recheck BP, Recheck medical problems.  Yeiden Frenkel P. Modesto Charon, M.D.

## 2013-09-03 ENCOUNTER — Telehealth: Payer: Self-pay | Admitting: *Deleted

## 2013-10-25 ENCOUNTER — Encounter: Payer: Self-pay | Admitting: *Deleted

## 2013-11-04 ENCOUNTER — Ambulatory Visit: Payer: Medicare Other | Admitting: Family Medicine

## 2013-11-05 ENCOUNTER — Ambulatory Visit: Payer: Medicare Other | Admitting: Family Medicine

## 2013-12-10 ENCOUNTER — Ambulatory Visit (INDEPENDENT_AMBULATORY_CARE_PROVIDER_SITE_OTHER): Payer: Medicare HMO | Admitting: Internal Medicine

## 2013-12-10 ENCOUNTER — Encounter: Payer: Self-pay | Admitting: Internal Medicine

## 2013-12-10 VITALS — BP 141/71 | HR 67 | Ht 71.5 in | Wt 167.0 lb

## 2013-12-10 DIAGNOSIS — I471 Supraventricular tachycardia, unspecified: Secondary | ICD-10-CM

## 2013-12-10 DIAGNOSIS — I4891 Unspecified atrial fibrillation: Secondary | ICD-10-CM

## 2013-12-10 NOTE — Patient Instructions (Signed)
Your physician recommends that you continue on your current medications as directed. Please refer to the Current Medication list given to you today.  Your physician wants you to follow-up in: 1 year with Dr. Klein.  You will receive a reminder letter in the mail two months in advance. If you don't receive a letter, please call our office to schedule the follow-up appointment.  

## 2013-12-10 NOTE — Progress Notes (Signed)
Patient Care Team: Vernie Shanks, MD as PCP - General (Family Medicine)   HPI  [name] is a 79 y.o. male Seen in  followup for paroxysmal atrial fibrillation and he required cardioversion about 6 years ago as well as hypertension     He also has a history of significant COPD previously oxygen-dependent but no longer using O2   He is on coumadin  He is nonobstructive coronary disease previously normal left ventricular function confirmed by echo 10/12  Past Medical History  Diagnosis Date  . Atrial fibrillation     coumadin therapy;  echo 1/10: EF 65-70%, mild diast dysfxn  . Hyperlipidemia   . GERD (gastroesophageal reflux disease)   . Allergic rhinitis   . Asthma   . Anxiety   . CAD (coronary artery disease)     cath 7/09: pCFX 25%, mRCA 25%, EF 60%;  Myoview 6/11: low risk, EF 67%  . History of pneumonia   . Sebaceous cyst     scalp  . SVT (supraventricular tachycardia)     s/p RFCA 05/2010  . First degree AV block   . History of TIAs   . Stroke sev yrs ago    TIA  . Hiatal hernia   . COPD (chronic obstructive pulmonary disease)     admitted 5/11, uses oxygen 2 liters/min prn rare use  . Emphysema   . Tubular adenoma 2014    Dr. Hilarie Fredrickson  . Hypertension     Past Surgical History  Procedure Laterality Date  . Knee surgery Right 1981  . Cystectomy  2012    removed from head  . Colonoscopy N/A 02/07/2013    Procedure: COLONOSCOPY;  Surgeon: Jerene Bears, MD;  Location: [facility];  Service: Gastroenterology;  Laterality: N/A;  . Esophagogastroduodenoscopy N/A 02/07/2013    Procedure: ESOPHAGOGASTRODUODENOSCOPY (EGD);  Surgeon: Jerene Bears, MD;  Location: [facility];  Service: Gastroenterology;  Laterality: N/A;  . Back surgery  1980    lower L 4 to L 5  . Cardiac electrophysiology mapping and ablation  2008  . Inguinal hernia repair Right yrs ago  . Cardiac valve surgery      per epic, pt denies  . Inguinal hernia repair Left 04/15/2013   Procedure: LEFT HERNIA REPAIR INGUINAL INCARCERATED;  Surgeon: Gayland Curry, MD;  Location: [facility];  Service: General;  Laterality: Left;  . Insertion of mesh Left 04/15/2013    Procedure: INSERTION OF MESH;  Surgeon: Gayland Curry, MD;  Location: [facility];  Service: General;  Laterality: Left;    Current Outpatient Prescriptions  Medication Sig Dispense Refill  . albuterol (ACCUNEB) 0.63 MG/3ML nebulizer solution Take 3 mLs (0.63 mg total) by nebulization every 6 (six) hours as needed for wheezing or shortness of breath.  75 mL  6  . lisinopril (PRINIVIL,ZESTRIL) 5 MG tablet Take 1 tablet (5 mg total) by mouth daily.  30 tablet  5  . mometasone-formoterol (DULERA) 200-5 MCG/ACT AERO Inhale 2 puffs into the lungs 2 (two) times daily.  13 g  0  . montelukast (SINGULAIR) 10 MG tablet Take 1 tablet (10 mg total) by mouth at bedtime.  30 tablet  0  . omeprazole (PRILOSEC) 20 MG capsule Take 40 mg by mouth daily.      . roflumilast (DALIRESP) 500 MCG TABS tablet Take 500 mcg by mouth.      . tiotropium (SPIRIVA) 18 MCG inhalation capsule Place 1 capsule (18 mcg total)  into inhaler and inhale daily.  30 capsule  0  . traZODone (DESYREL) 50 MG tablet Take 0.5-1 tablets (25-50 mg total) by mouth at bedtime as needed for sleep.  30 tablet  3  . warfarin (COUMADIN) 2 MG tablet Take 1-2 tablets (2-4 mg total) by mouth daily. Take one tablet on mon, wed, and Friday, and take two tablets on all other days  60 tablet  1  . [DISCONTINUED] albuterol-ipratropium (COMBIVENT) 18-103 MCG/ACT inhaler Inhale 1 puff into the lungs every 6 (six) hours as needed. For wheezing      . [DISCONTINUED] atorvastatin (LIPITOR) 20 MG tablet Take 20 mg by mouth daily.        . [DISCONTINUED] clonazePAM (KLONOPIN) 0.5 MG tablet Take 0.5 mg by mouth at bedtime as needed. For sleep       . [DISCONTINUED] furosemide (LASIX) 20 MG tablet Take 20 mg by mouth daily.         No current facility-administered medications for this visit.     No Known Allergies  Review of Systems negative except from HPI and PMH  Physical Exam BP 141/71  Pulse 67  Ht 5' 11.5" (1.816 m)  Wt 167 lb (75.751 kg)  BMI 22.97 kg/m2 Well developed and well nourished in no acute distress HENT normal E scleral and icterus clear Neck Supple JVP flat; carotids brisk and full  No bruits Clear to ausculation  Regular rate and rhythm, no murmurs gallops or rub Soft with active bowel sounds No clubbing cyanosis  Edema Alert and oriented, grossly normal motor and sensory function Skin Warm and Dry  ECG demonstrates sinus rhythm at 57 Intervals 21/10/38  Assessment and  Plan  Atrial fibrillation  Hypertension  Preoperative consultation  The patient is stable and holding sinus rhythm. He remains on Coumadin. This can be discontinued as needed for his surgery. Blood pressure is well-controlled; he is euvolemic. His her risks should be helpful.

## 2013-12-17 ENCOUNTER — Telehealth: Payer: Self-pay | Admitting: Internal Medicine

## 2014-01-02 ENCOUNTER — Telehealth: Payer: Self-pay | Admitting: Internal Medicine

## 2014-01-02 NOTE — Telephone Encounter (Signed)
New message     Patient told PCP that Dr Caryl Comes told him to stop his coumadin forever.  They want to make sure that was true.

## 2014-01-02 NOTE — Telephone Encounter (Signed)
Pt tells me he is still taking Coumadin and hasn't stopped. I explained that he is to remain on Coumadin, but he may stop it for his surgery. He states that his doctors office is going to bridge him with Lovenox. Informed PCP office Caryl Pina) that pt is to remain on coumadin, only stopping for his upcoming surgery. All parties verbalized understanding.

## 2014-01-21 ENCOUNTER — Other Ambulatory Visit: Payer: Self-pay | Admitting: Surgical

## 2014-02-11 ENCOUNTER — Encounter (HOSPITAL_COMMUNITY)
Admission: RE | Admit: 2014-02-11 | Discharge: 2014-02-11 | Disposition: A | Discharge status: Home / Self Care | Payer: Medicare HMO | Source: Ambulatory Visit | Attending: Orthopedic Surgery | Admitting: Orthopedic Surgery

## 2014-02-11 ENCOUNTER — Encounter (HOSPITAL_COMMUNITY): Payer: Self-pay

## 2014-02-11 ENCOUNTER — Encounter (HOSPITAL_COMMUNITY): Payer: Self-pay | Admitting: Pharmacy Technician

## 2014-02-11 DIAGNOSIS — Z01812 Encounter for preprocedural laboratory examination: Secondary | ICD-10-CM | POA: Insufficient documentation

## 2014-02-11 LAB — COMPREHENSIVE METABOLIC PANEL
ALT: 8 U/L (ref 0–53)
AST: 13 U/L (ref 0–37)
Albumin: 3.6 g/dL (ref 3.5–5.2)
Alkaline Phosphatase: 83 U/L (ref 39–117)
BUN: 8 mg/dL (ref 6–23)
CO2: 29 mEq/L (ref 19–32)
Calcium: 9.8 mg/dL (ref 8.4–10.5)
Chloride: 103 mEq/L (ref 96–112)
Creatinine, Ser: 0.79 mg/dL (ref 0.50–1.35)
GFR calc Af Amer: >90 mL/min (ref >90)
GFR calc non Af Amer: 85 mL/min — ABNORMAL LOW (ref >90)
Glucose, Bld: 81 mg/dL (ref 70–99)
Potassium: 4.5 mEq/L (ref 3.7–5.3)
Sodium: 141 mEq/L (ref 137–147)
Total Bilirubin: 0.2 mg/dL — ABNORMAL LOW (ref 0.3–1.2)
Total Protein: 6.6 g/dL (ref 6.0–8.3)

## 2014-02-11 LAB — CBC
HCT: 39.3 % (ref 39.0–52.0)
HEMOGLOBIN: 12.9 g/dL — AB (ref 13.0–17.0)
MCH: 28.9 pg (ref 26.0–34.0)
MCHC: 32.8 g/dL (ref 30.0–36.0)
MCV: 88.1 fL (ref 78.0–100.0)
Platelets: 255 10*3/uL (ref 150–400)
RBC: 4.46 MIL/uL (ref 4.22–5.81)
RDW: 13 % (ref 11.5–15.5)
WBC: 7 10*3/uL (ref 4.0–10.5)

## 2014-02-11 LAB — PROTIME-INR
INR: 2.17 — ABNORMAL HIGH (ref 0.00–1.49)
Prothrombin Time: 23.5 seconds — ABNORMAL HIGH (ref 11.6–15.2)

## 2014-02-11 LAB — URINALYSIS, ROUTINE W REFLEX MICROSCOPIC
Bilirubin Urine: NEGATIVE
Glucose, UA: NEGATIVE mg/dL
Hgb urine dipstick: NEGATIVE
Ketones, ur: NEGATIVE mg/dL
Leukocytes, UA: NEGATIVE
Nitrite: NEGATIVE
Protein, ur: NEGATIVE mg/dL
Specific Gravity, Urine: 1.009 (ref 1.005–1.030)
Urobilinogen, UA: 1 mg/dL (ref 0.0–1.0)
pH: 6.5 (ref 5.0–8.0)

## 2014-02-11 LAB — SURGICAL PCR SCREEN
MRSA, PCR: NEGATIVE
Staphylococcus aureus: NEGATIVE

## 2014-02-11 LAB — APTT: aPTT: 36 seconds (ref 24–37)

## 2014-02-11 NOTE — Patient Instructions (Signed)
22 [name]  02/11/2014   Your procedure is scheduled on:   02-19-2014  Enter through [facility] Entrance and follow signs to [place]. Arrive at   0630     AM.  Call this number if you have problems the morning of surgery: [phone]  Or Presurgical Testing [phone](Mehki Klumpp) For Living Will and/or Health Care Power Attorney Forms: please provide copy for your medical record,may bring AM of surgery(Forms should be already notarized -we do not provide this service).(02-11-14 Prefers No information preferred today).  Remember: Follow any bowel prep instructions per MD office.   Do not eat food:After Midnight.    Take these medicines the morning of surgery with A SIP OF WATER: Prilosec. Use/bring Inhalers. Use Coumadin, Lovenox as per MD instructions.   Do not wear jewelry, make-up or nail polish.  Do not wear lotions, powders, or perfumes. You may wear deodorant.  Do not shave 48 hours(2 days) prior to first CHG shower(legs and under arms).(Shaving face and neck okay.)  Do not bring valuables to the hospital.(Hospital is not responsible for lost valuables).  Contacts, dentures or removable bridgework, body piercing, hair pins may not be worn into surgery.  Leave suitcase in the car. After surgery it may be brought to your room.  For patients admitted to the hospital, checkout time is 11:00 AM the day of discharge.(Restricted visitors-Any Persons displaying flu-like symptoms or illness).    Patients discharged the day of surgery will not be allowed to drive home. Must have responsible person with you x 24 hours once discharged.  Name and phone number of your driver: Kay-spouse [phone]  Special Instructions: CHG(Chlorhedine 4%-"Hibiclens","Betasept","Aplicare") Shower Use Special Wash: see special instructions.(avoid face and genitals)   Please read over the following fact sheets that you were given: MRSA Information, Blood Transfusion fact sheet, Incentive  Spirometry Instruction.  Remember : Type/Screen "Blue armbands" - may not be removed once applied(would result in being retested AM of surgery, if removed).  Failure to follow these instructions may result in Cancellation of your surgery.   ___________________    [facility] - Preparing for Surgery Before surgery, you can play an important role.  Because skin is not sterile, your skin needs to be as free of germs as possible.  You can reduce the number of germs on your skin by washing with CHG (chlorahexidine gluconate) soap before surgery.  CHG is an antiseptic cleaner which kills germs and bonds with the skin to continue killing germs even after washing. Please DO NOT use if you have an allergy to CHG or antibacterial soaps.  If your skin becomes reddened/irritated stop using the CHG and inform your nurse when you arrive at Short Stay. Do not shave (including legs and underarms) for at least 48 hours prior to the first CHG shower.  You may shave your face/neck. Please follow these instructions carefully:  1.  Shower with CHG Soap the night before surgery and the  morning of Surgery.  2.  If you choose to wash your hair, wash your hair first as usual with your  normal  shampoo.  3.  After you shampoo, rinse your hair and body thoroughly to remove the  shampoo.                           4.  Use CHG as you would any other liquid soap.  You can apply chg directly  to the skin  and wash                       Gently with a scrungie or clean washcloth.  5.  Apply the CHG Soap to your body ONLY FROM THE NECK DOWN.   Do not use on face/ open                           Wound or open sores. Avoid contact with eyes, ears mouth and genitals (private parts).                       Wash face,  Genitals (private parts) with your normal soap.             6.  Wash thoroughly, paying special attention to the area where your surgery  will be performed.  7.  Thoroughly rinse your body with warm water from the neck  down.  8.  DO NOT shower/wash with your normal soap after using and rinsing off  the CHG Soap.                9.  Pat yourself dry with a clean towel.            10.  Wear clean pajamas.            11.  Place clean sheets on your bed the night of your first shower and do not  sleep with pets. Day of Surgery : Do not apply any lotions/deodorants the morning of surgery.  Please wear clean clothes to the hospital/surgery center.  FAILURE TO FOLLOW THESE INSTRUCTIONS MAY RESULT IN THE CANCELLATION OF YOUR SURGERY PATIENT SIGNATURE_________________________________  NURSE SIGNATURE__________________________________  ________________________________________________________________________   [name]  An incentive spirometer is a tool that can help keep your lungs clear and active. This tool measures how well you are filling your lungs with each breath. Taking long deep breaths may help reverse or decrease the chance of developing breathing (pulmonary) problems (especially infection) following:  A long period of time when you are unable to move or be active. BEFORE THE PROCEDURE   If the spirometer includes an indicator to show your best effort, your nurse or respiratory therapist will set it to a desired goal.  If possible, sit up straight or lean slightly forward. Try not to slouch.  Hold the incentive spirometer in an upright position. INSTRUCTIONS FOR USE  1. Sit on the edge of your bed if possible, or sit up as far as you can in bed or on a chair. 2. Hold the incentive spirometer in an upright position. 3. Breathe out normally. 4. Place the mouthpiece in your mouth and seal your lips tightly around it. 5. Breathe in slowly and as deeply as possible, raising the piston or the ball toward the top of the column. 6. Hold your breath for 3-5 seconds or for as long as possible. Allow the piston or ball to fall to the bottom of the column. 7. Remove the mouthpiece from your mouth  and breathe out normally. 8. Rest for a few seconds and repeat Steps 1 through 7 at least 10 times every 1-2 hours when you are awake. Take your time and take a few normal breaths between deep breaths. 9. The spirometer may include an indicator to show your best effort. Use the indicator as a goal to work toward during each repetition. 10. After each set of  10 deep breaths, practice coughing to be sure your lungs are clear. If you have an incision (the cut made at the time of surgery), support your incision when coughing by placing a pillow or rolled up towels firmly against it. Once you are able to get out of bed, walk around indoors and cough well. You may stop using the incentive spirometer when instructed by your caregiver.  RISKS AND COMPLICATIONS  Take your time so you do not get dizzy or light-headed.  If you are in pain, you may need to take or ask for pain medication before doing incentive spirometry. It is harder to take a deep breath if you are having pain. AFTER USE  Rest and breathe slowly and easily.  It can be helpful to keep track of a log of your progress. Your caregiver can provide you with a simple table to help with this. If you are using the spirometer at home, follow these instructions: [place] IF:   You are having difficultly using the spirometer.  You have trouble using the spirometer as often as instructed.  Your pain medication is not giving enough relief while using the spirometer.  You develop fever of 100.5 F (38.1 C) or higher. SEEK IMMEDIATE MEDICAL CARE IF:   You cough up bloody sputum that had not been present before.  You develop fever of 102 F (38.9 C) or greater.  You develop worsening pain at or near the incision site. MAKE SURE YOU:   Understand these instructions.  Will watch your condition.  Will get help right away if you are not doing well or get worse. Document Released: 01/09/2007 Document Revised: 11/21/2011 Document  Reviewed: 03/12/2007 ExitCare Patient Information 2014 [employer], [place].   ________________________________________________________________________  WHAT IS A BLOOD TRANSFUSION? Blood Transfusion Information  A transfusion is the replacement of blood or some of its parts. Blood is made up of multiple cells which provide different functions.  Red blood cells carry oxygen and are used for blood loss replacement.  White blood cells fight against infection.  Platelets control bleeding.  Plasma helps clot blood.  Other blood products are available for specialized needs, such as hemophilia or other clotting disorders. BEFORE THE TRANSFUSION  Who gives blood for transfusions?   Healthy volunteers who are fully evaluated to make sure their blood is safe. This is blood bank blood. Transfusion therapy is the safest it has ever been in the practice of medicine. Before blood is taken from a donor, a complete history is taken to make sure that person has no history of diseases nor engages in risky social behavior (examples are intravenous drug use or sexual activity with multiple partners). The donor's travel history is screened to minimize risk of transmitting infections, such as malaria. The donated blood is tested for signs of infectious diseases, such as HIV and hepatitis. The blood is then tested to be sure it is compatible with you in order to minimize the chance of a transfusion reaction. If you or a relative donates blood, this is often done in anticipation of surgery and is not appropriate for emergency situations. It takes many days to process the donated blood. RISKS AND COMPLICATIONS Although transfusion therapy is very safe and saves many lives, the main dangers of transfusion include:   Getting an infectious disease.  Developing a transfusion reaction. This is an allergic reaction to something in the blood you were given. Every precaution is taken to prevent this. The decision to have a  blood  transfusion has been considered carefully by your caregiver before blood is given. Blood is not given unless the benefits outweigh the risks. AFTER THE TRANSFUSION  Right after receiving a blood transfusion, you will usually feel much better and more energetic. This is especially true if your red blood cells have gotten low (anemic). The transfusion raises the level of the red blood cells which carry oxygen, and this usually causes an energy increase.  The nurse administering the transfusion will monitor you carefully for complications. HOME CARE INSTRUCTIONS  No special instructions are needed after a transfusion. You may find your energy is better. Speak with your caregiver about any limitations on activity for underlying diseases you may have. SEEK MEDICAL CARE IF:   Your condition is not improving after your transfusion.  You develop redness or irritation at the intravenous (IV) site. SEEK IMMEDIATE MEDICAL CARE IF:  Any of the following symptoms occur over the next 12 hours:  Shaking chills.  You have a temperature by mouth above 102 F (38.9 C), not controlled by medicine.  Chest, back, or muscle pain.  People around you feel you are not acting correctly or are confused.  Shortness of breath or difficulty breathing.  Dizziness and fainting.  You get a rash or develop hives.  You have a decrease in urine output.  Your urine turns a dark color or changes to pink, red, or brown. Any of the following symptoms occur over the next 10 days:  You have a temperature by mouth above 102 F (38.9 C), not controlled by medicine.  Shortness of breath.  Weakness after normal activity.  The white part of the eye turns yellow (jaundice).  You have a decrease in the amount of urine or are urinating less often.  Your urine turns a dark color or changes to pink, red, or brown. Document Released: 08/26/2000 Document Revised: 11/21/2011 Document Reviewed: 04/14/2008 [facility]  Patient Information 2014 [place], [place].  _______________________________________________________________________

## 2014-02-11 NOTE — H&P (Signed)
TOTAL KNEE ADMISSION H&P  Patient is being admitted for right total knee arthroplasty.  Subjective:  Chief Complaint:right knee pain.  HPI: [name], 79 y.o. male, has a history of pain and functional disability in the right knee due to arthritis and has failed non-surgical conservative treatments for greater than 12 weeks to includeNSAID's and/or analgesics, corticosteriod injections, flexibility and strengthening excercises and activity modification.  Onset of symptoms was gradual, starting >10 years ago with gradually worsening course since that time. The patient noted no past surgery on the right knee(s).  Patient currently rates pain in the right knee(s) at 8 out of 10 with activity. Patient has night pain, worsening of pain with activity and weight bearing, pain that interferes with activities of daily living, pain with passive range of motion, crepitus and joint swelling.  Patient has evidence of periarticular osteophytes and joint space narrowing by imaging studies.There is no active infection.  Patient Active Problem List   Diagnosis Date Noted  . HTN (hypertension) 08/05/2013  . Hypertension   . Healthcare-associated pneumonia 06/07/2013  . Acute and chronic respiratory failure 06/07/2013  . Fever 06/07/2013  . Leukocytosis, unspecified 06/07/2013  . GERD (gastroesophageal reflux disease) 06/07/2013  . SIRS (systemic inflammatory response syndrome) 06/07/2013  . Chronic diastolic heart failure 62/13/0865  . Acute urinary retention 04/16/2013  . Inguinal hernia with incarceration 03/07/2013  . Benign neoplasm of colon 02/07/2013  . Colon cancer screening 02/07/2013  . Hyperthyroidism 01/28/2013  . COPD exacerbation 01/28/2013  . Anxiety state, unspecified 01/28/2013  . Arthritis 01/28/2013  . Chronic pain syndrome 01/28/2013  . Unspecified constipation 01/03/2013  . Loss of weight 01/03/2013  . Diverticulitis of colon (without mention of hemorrhage) 01/03/2013  .  Other and unspecified coagulation defects 01/03/2013  . Cough 09/27/2011  . Pre-operative cardiovascular examination 06/15/2011  . AV BLOCK, 1ST DEGREE 07/13/2010  . SVT/ PSVT/ PAT 05/04/2010  . HYPERLIPIDEMIA 05/15/2009  . PAROXYSMAL ATRIAL FIBRILLATION 05/15/2009  . TIA 05/15/2009  . COPD 05/15/2009  . GERD 05/15/2009   Past Medical History  Diagnosis Date  . Atrial fibrillation     coumadin therapy;  echo 1/10: EF 65-70%, mild diast dysfxn  . Hyperlipidemia   . GERD (gastroesophageal reflux disease)   . Allergic rhinitis   . Asthma   . Anxiety   . CAD (coronary artery disease)     cath 7/09: pCFX 25%, mRCA 25%, EF 60%;  Myoview 6/11: low risk, EF 67%  . History of pneumonia   . Sebaceous cyst     scalp  . SVT (supraventricular tachycardia)     s/p RFCA 05/2010  . First degree AV block   . History of TIAs   . Stroke sev yrs ago    TIA  . Hiatal hernia   . COPD (chronic obstructive pulmonary disease)     admitted 5/11, uses oxygen 2 liters/min prn rare use  . Emphysema   . Tubular adenoma 2014    Dr. Hilarie Fredrickson  . Hypertension     Past Surgical History  Procedure Laterality Date  . Knee surgery Right 1981  . Cystectomy  2012    removed from head  . Colonoscopy N/A 02/07/2013    Procedure: COLONOSCOPY;  Surgeon: Jerene Bears, MD;  Location: [facility] ENDOSCOPY;  Service: Gastroenterology;  Laterality: N/A;  . Esophagogastroduodenoscopy N/A 02/07/2013    Procedure: ESOPHAGOGASTRODUODENOSCOPY (EGD);  Surgeon: Jerene Bears, MD;  Location: [facility];  Service: Gastroenterology;  Laterality: N/A;  . Back surgery  1980    lower L 4 to L 5  . Cardiac electrophysiology mapping and ablation  2008  . Inguinal hernia repair Right yrs ago  . Cardiac valve surgery      per epic, pt denies  . Inguinal hernia repair Left 04/15/2013    Procedure: LEFT HERNIA REPAIR INGUINAL INCARCERATED;  Surgeon: Gayland Curry, MD;  Location: [facility] ORS;  Service: General;  Laterality: Left;  . Insertion of  mesh Left 04/15/2013    Procedure: INSERTION OF MESH;  Surgeon: Gayland Curry, MD;  Location: [facility] ORS;  Service: General;  Laterality: Left;     Current outpatient prescriptions: albuterol (PROVENTIL HFA;VENTOLIN HFA) 108 (90 BASE) MCG/ACT inhaler, Inhale 2 puffs into the lungs every 6 (six) hours as needed for wheezing or shortness of breath., Disp: , Rfl: ; enoxaparin (LOVENOX) 40 MG/0.4ML injection, Inject 40 mg into the skin daily., Disp: , Rfl: ;   lisinopril (PRINIVIL,ZESTRIL) 2.5 MG tablet, Take 2.5 mg by mouth every morning., Disp: , Rfl:  mometasone-formoterol (DULERA) 200-5 MCG/ACT AERO, Inhale 2 puffs into the lungs 2 (two) times daily., Disp: 13 g, Rfl: 0;   omeprazole (PRILOSEC) 20 MG capsule, Take 40 mg by mouth daily., Disp: , Rfl: ;   roflumilast (DALIRESP) 500 MCG TABS tablet, Take 500 mcg by mouth daily. , Disp: , Rfl: ;   traZODone (DESYREL) 100 MG tablet, Take 100 mg by mouth at bedtime as needed for sleep., Disp: , Rfl:  warfarin (COUMADIN) 5 MG tablet, Take 5 mg by mouth daily. Sunday, Wednesday and Saturday, Disp: , Rfl: ;   warfarin (COUMADIN) 7.5 MG tablet, Take 7.5 mg by mouth daily. Monday, Tuesday, Thursday and friday, Disp: , Rfl: ;   No Known Allergies  History  Substance Use Topics  . Smoking status: Former Smoker -- 3.00 packs/day for 40 years    Types: Cigarettes    Quit date: 09/13/1995  . Smokeless tobacco: Never Used  . Alcohol Use: No    Family History  Problem Relation Age of Onset  . Hypertension Mother   . COPD Father   . COPD Sister   . Cancer Brother     lung  . Lung cancer Mother      Review of Systems  Constitutional: Negative.   HENT: Negative.   Eyes: Negative.   Respiratory: Positive for cough, sputum production, shortness of breath and wheezing. Negative for hemoptysis.   Cardiovascular: Negative.   Gastrointestinal: Negative.   Genitourinary: Negative.   Musculoskeletal: Positive for back pain and joint pain. Negative for falls,  myalgias and neck pain.       Right knee pain  Skin: Negative.   Neurological: Negative.   Endo/Heme/Allergies: Negative.   Psychiatric/Behavioral: Negative.     Objective:  Physical Exam  Constitutional: He is oriented to person, place, and time. He appears well-developed and well-nourished. No distress.  HENT:  Head: Normocephalic and atraumatic.  Right Ear: External ear normal.  Left Ear: External ear normal.  Nose: Nose normal.  Mouth/Throat: Oropharynx is clear and moist.  Eyes: Conjunctivae and EOM are normal.  Neck: Normal range of motion. Neck supple.  Cardiovascular: Normal rate, normal heart sounds and intact distal pulses.  An irregularly irregular rhythm present.  No murmur heard. Respiratory: Effort normal. No respiratory distress. He has wheezes.  Wheezing cleared with cough  GI: Soft. Bowel sounds are normal. He exhibits no distension. There is no tenderness.  Musculoskeletal:       Right hip: Normal.  Left hip: Normal.       Right knee: He exhibits decreased range of motion and swelling. He exhibits no effusion and no erythema. Tenderness found. Medial joint line and lateral joint line tenderness noted.       Left knee: He exhibits decreased range of motion. He exhibits no swelling, no effusion and no erythema. Tenderness found. Medial joint line and lateral joint line tenderness noted.       Right lower leg: He exhibits no tenderness and no swelling.       Left lower leg: He exhibits no tenderness and no swelling.  Neurological: He is alert and oriented to person, place, and time. He has normal strength and normal reflexes. No sensory deficit.  Skin: No rash noted. He is not diaphoretic. No erythema.  Psychiatric: He has a normal mood and affect. His behavior is normal.    Vitals Weight: 170 lb Height: 71 in Body Surface Area: 1.97 m Body Mass Index: 23.71 kg/m Pulse: 72 (Regular) BP: 140/70 (Sitting, Left Arm, Standard)  Imaging  Review Plain radiographs demonstrate severe degenerative joint disease of the right knee(s). The overall alignment ismild varus. The bone quality appears to be good for age and reported activity level.  Assessment/Plan:  End stage arthritis, right knee   The patient history, physical examination, clinical judgment of the provider and imaging studies are consistent with end stage degenerative joint disease of the right knee(s) and total knee arthroplasty is deemed medically necessary. The treatment options including medical management, injection therapy arthroscopy and arthroplasty were discussed at length. The risks and benefits of total knee arthroplasty were presented and reviewed. The risks due to aseptic loosening, infection, stiffness, patella tracking problems, thromboembolic complications and other imponderables were discussed. The patient acknowledged the explanation, agreed to proceed with the plan and consent was signed. Patient is being admitted for inpatient treatment for surgery, pain control, PT, OT, prophylactic antibiotics, VTE prophylaxis, progressive ambulation and ADL's and discharge planning. The patient is planning to be discharged home with home health services    [place], [place]

## 2014-02-11 NOTE — Pre-Procedure Instructions (Signed)
02-11-14 EKG 3'15,Echo '12, CXR 11'14Epic. Clearance note Dr. Caryl Comes 01-08-14 with chart.

## 2014-02-12 LAB — ABO/RH: ABO/RH(D): O POS

## 2014-02-19 ENCOUNTER — Inpatient Hospital Stay (HOSPITAL_COMMUNITY): Payer: Medicare HMO | Admitting: Registered Nurse

## 2014-02-19 ENCOUNTER — Inpatient Hospital Stay (HOSPITAL_COMMUNITY)
Admission: RE | Admit: 2014-02-19 | Discharge: 2014-02-21 | DRG: 470 | Disposition: A | Discharge status: Home Health | Payer: Medicare HMO | Source: Ambulatory Visit | Attending: Orthopedic Surgery | Admitting: Orthopedic Surgery

## 2014-02-19 ENCOUNTER — Encounter (HOSPITAL_COMMUNITY): Payer: Self-pay | Admitting: *Deleted

## 2014-02-19 ENCOUNTER — Encounter (HOSPITAL_COMMUNITY): Payer: Medicare HMO | Admitting: Registered Nurse

## 2014-02-19 ENCOUNTER — Inpatient Hospital Stay (HOSPITAL_COMMUNITY): Payer: Medicare HMO

## 2014-02-19 ENCOUNTER — Encounter (HOSPITAL_COMMUNITY)
Admission: RE | Disposition: A | Discharge status: Home Health | Payer: Self-pay | Source: Ambulatory Visit | Attending: Orthopedic Surgery

## 2014-02-19 DIAGNOSIS — Z79899 Other long term (current) drug therapy: Secondary | ICD-10-CM

## 2014-02-19 DIAGNOSIS — Z8673 Personal history of transient ischemic attack (TIA), and cerebral infarction without residual deficits: Secondary | ICD-10-CM

## 2014-02-19 DIAGNOSIS — M1711 Unilateral primary osteoarthritis, right knee: Secondary | ICD-10-CM

## 2014-02-19 DIAGNOSIS — E785 Hyperlipidemia, unspecified: Secondary | ICD-10-CM | POA: Diagnosis present

## 2014-02-19 DIAGNOSIS — J438 Other emphysema: Secondary | ICD-10-CM | POA: Diagnosis present

## 2014-02-19 DIAGNOSIS — IMO0002 Reserved for concepts with insufficient information to code with codable children: Secondary | ICD-10-CM

## 2014-02-19 DIAGNOSIS — I2510 Atherosclerotic heart disease of native coronary artery without angina pectoris: Secondary | ICD-10-CM | POA: Diagnosis present

## 2014-02-19 DIAGNOSIS — D62 Acute posthemorrhagic anemia: Secondary | ICD-10-CM | POA: Diagnosis not present

## 2014-02-19 DIAGNOSIS — Z96659 Presence of unspecified artificial knee joint: Secondary | ICD-10-CM

## 2014-02-19 DIAGNOSIS — Z7901 Long term (current) use of anticoagulants: Secondary | ICD-10-CM

## 2014-02-19 DIAGNOSIS — I4891 Unspecified atrial fibrillation: Secondary | ICD-10-CM | POA: Diagnosis present

## 2014-02-19 DIAGNOSIS — M24569 Contracture, unspecified knee: Secondary | ICD-10-CM | POA: Diagnosis present

## 2014-02-19 DIAGNOSIS — K219 Gastro-esophageal reflux disease without esophagitis: Secondary | ICD-10-CM | POA: Diagnosis present

## 2014-02-19 DIAGNOSIS — I10 Essential (primary) hypertension: Secondary | ICD-10-CM | POA: Diagnosis present

## 2014-02-19 DIAGNOSIS — Z01812 Encounter for preprocedural laboratory examination: Secondary | ICD-10-CM

## 2014-02-19 DIAGNOSIS — K449 Diaphragmatic hernia without obstruction or gangrene: Secondary | ICD-10-CM | POA: Diagnosis present

## 2014-02-19 DIAGNOSIS — M1710 Unilateral primary osteoarthritis, unspecified knee: Principal | ICD-10-CM | POA: Diagnosis present

## 2014-02-19 DIAGNOSIS — Z87891 Personal history of nicotine dependence: Secondary | ICD-10-CM

## 2014-02-19 HISTORY — PX: TOTAL KNEE ARTHROPLASTY: SHX125

## 2014-02-19 LAB — TYPE AND SCREEN
ABO/RH(D): O POS
Antibody Screen: NEGATIVE

## 2014-02-19 LAB — PROTIME-INR
INR: 1.22 (ref 0.00–1.49)
Prothrombin Time: 15.1 seconds (ref 11.6–15.2)

## 2014-02-19 SURGERY — ARTHROPLASTY, KNEE, TOTAL
Anesthesia: General | Site: Knee | Laterality: Right

## 2014-02-19 MED ORDER — OXYCODONE HCL 5 MG/5ML PO SOLN: 5.0000 mg | Freq: Once | ORAL | Status: DC | PRN

## 2014-02-19 MED ORDER — MEPERIDINE HCL 50 MG/ML IJ SOLN
6.2500 mg | INTRAMUSCULAR | Status: DC | PRN
Start: 2014-02-19 — End: 2014-02-19

## 2014-02-19 MED ORDER — HYDROCODONE-ACETAMINOPHEN 5-325 MG PO TABS
1.0000 | ORAL_TABLET | ORAL | Status: DC | PRN
  Administered 2014-02-19 (×2): 1 via ORAL
  Administered 2014-02-20 – 2014-02-21 (×5): 2 via ORAL
  Filled 2014-02-19: qty 1
  Filled 2014-02-19: qty 2
  Filled 2014-02-19: qty 1
  Filled 2014-02-19 (×4): qty 2

## 2014-02-19 MED ORDER — BUPIVACAINE LIPOSOME 1.3 % IJ SUSP
INTRAMUSCULAR | Status: DC | PRN
  Administered 2014-02-19: 20 mL

## 2014-02-19 MED ORDER — THROMBIN 5000 UNITS EX SOLR
CUTANEOUS | Status: AC
  Filled 2014-02-19: qty 5000

## 2014-02-19 MED ORDER — FLEET ENEMA 7-19 GM/118ML RE ENEM: 1.0000 | ENEMA | Freq: Once | RECTAL | Status: AC | PRN

## 2014-02-19 MED ORDER — POLYMYXIN B SULFATE 500000 UNITS IJ SOLR
INTRAMUSCULAR | Status: DC | PRN
Start: 2014-02-19 — End: 2014-02-19
  Administered 2014-02-19 (×2)

## 2014-02-19 MED ORDER — TRAZODONE HCL 50 MG PO TABS: 100.0000 mg | ORAL_TABLET | Freq: Every evening | ORAL | Status: DC | PRN

## 2014-02-19 MED ORDER — FENTANYL CITRATE 0.05 MG/ML IJ SOLN
INTRAMUSCULAR | Status: DC | PRN
  Administered 2014-02-19: 50 ug via INTRAVENOUS
  Administered 2014-02-19: 150 ug via INTRAVENOUS
  Administered 2014-02-19: 100 ug via INTRAVENOUS
  Administered 2014-02-19 (×4): 50 ug via INTRAVENOUS

## 2014-02-19 MED ORDER — PROPOFOL 10 MG/ML IV BOLUS
INTRAVENOUS | Status: DC | PRN
  Administered 2014-02-19: 160 mg via INTRAVENOUS
  Administered 2014-02-19: 50 mg via INTRAVENOUS
  Administered 2014-02-19: 40 mg via INTRAVENOUS

## 2014-02-19 MED ORDER — ALBUTEROL SULFATE (2.5 MG/3ML) 0.083% IN NEBU: 2.5000 mg | INHALATION_SOLUTION | Freq: Four times a day (QID) | RESPIRATORY_TRACT | Status: DC | PRN

## 2014-02-19 MED ORDER — LACTATED RINGERS IV SOLN
INTRAVENOUS | Status: DC
  Administered 2014-02-19: 09:00:00 via INTRAVENOUS
  Administered 2014-02-19: 1000 mL via INTRAVENOUS
  Administered 2014-02-19: 08:00:00 via INTRAVENOUS

## 2014-02-19 MED ORDER — POLYETHYLENE GLYCOL 3350 17 G PO PACK: 17.0000 g | PACK | Freq: Every day | ORAL | Status: DC | PRN

## 2014-02-19 MED ORDER — ACETAMINOPHEN 10 MG/ML IV SOLN
INTRAVENOUS | Status: DC | PRN
  Administered 2014-02-19: 1000 mg via INTRAVENOUS

## 2014-02-19 MED ORDER — CHLORHEXIDINE GLUCONATE 4 % EX LIQD: 60.0000 mL | Freq: Once | CUTANEOUS | Status: DC

## 2014-02-19 MED ORDER — OXYCODONE-ACETAMINOPHEN 5-325 MG PO TABS
2.0000 | ORAL_TABLET | ORAL | Status: DC | PRN
  Administered 2014-02-21: 2 via ORAL
  Filled 2014-02-19 (×2): qty 2

## 2014-02-19 MED ORDER — PROMETHAZINE HCL 25 MG/ML IJ SOLN: 6.2500 mg | INTRAMUSCULAR | Status: DC | PRN

## 2014-02-19 MED ORDER — PHENOL 1.4 % MT LIQD: 1.0000 | OROMUCOSAL | Status: DC | PRN

## 2014-02-19 MED ORDER — ONDANSETRON HCL 4 MG/2ML IJ SOLN
INTRAMUSCULAR | Status: DC | PRN
  Administered 2014-02-19: 4 mg via INTRAVENOUS

## 2014-02-19 MED ORDER — METHOCARBAMOL 500 MG PO TABS
500.0000 mg | ORAL_TABLET | Freq: Four times a day (QID) | ORAL | Status: DC | PRN
  Administered 2014-02-20 – 2014-02-21 (×2): 500 mg via ORAL
  Filled 2014-02-19 (×2): qty 1

## 2014-02-19 MED ORDER — DEXAMETHASONE SODIUM PHOSPHATE 10 MG/ML IJ SOLN
INTRAMUSCULAR | Status: DC | PRN
  Administered 2014-02-19: 10 mg via INTRAVENOUS

## 2014-02-19 MED ORDER — CEFAZOLIN SODIUM-DEXTROSE 2-3 GM-% IV SOLR
2.0000 g | INTRAVENOUS | Status: AC
  Administered 2014-02-19: 2 g via INTRAVENOUS

## 2014-02-19 MED ORDER — LIDOCAINE HCL (CARDIAC) 20 MG/ML IV SOLN
INTRAVENOUS | Status: AC
  Filled 2014-02-19: qty 5

## 2014-02-19 MED ORDER — ONDANSETRON HCL 4 MG/2ML IJ SOLN
4.0000 mg | Freq: Four times a day (QID) | INTRAMUSCULAR | Status: DC | PRN
  Administered 2014-02-19: 4 mg via INTRAVENOUS
  Filled 2014-02-19: qty 2

## 2014-02-19 MED ORDER — SODIUM CHLORIDE 0.9 % IJ SOLN
INTRAMUSCULAR | Status: DC | PRN
  Administered 2014-02-19: 20 mL

## 2014-02-19 MED ORDER — PROMETHAZINE HCL 25 MG/ML IJ SOLN
6.2500 mg | Freq: Four times a day (QID) | INTRAMUSCULAR | Status: DC | PRN
  Administered 2014-02-19: 6.25 mg via INTRAVENOUS
  Filled 2014-02-19: qty 1

## 2014-02-19 MED ORDER — PANTOPRAZOLE SODIUM 40 MG PO TBEC
40.0000 mg | DELAYED_RELEASE_TABLET | Freq: Every day | ORAL | Status: DC
  Administered 2014-02-19 – 2014-02-21 (×3): 40 mg via ORAL
  Filled 2014-02-19 (×3): qty 1

## 2014-02-19 MED ORDER — HYDROMORPHONE HCL PF 2 MG/ML IJ SOLN
INTRAMUSCULAR | Status: AC
  Filled 2014-02-19: qty 1

## 2014-02-19 MED ORDER — LIDOCAINE HCL (CARDIAC) 20 MG/ML IV SOLN
INTRAVENOUS | Status: DC | PRN
  Administered 2014-02-19: 25 mg via INTRATRACHEAL
  Administered 2014-02-19: 75 mg via INTRAVENOUS

## 2014-02-19 MED ORDER — ROCURONIUM BROMIDE 100 MG/10ML IV SOLN
INTRAVENOUS | Status: AC
  Filled 2014-02-19: qty 1

## 2014-02-19 MED ORDER — LISINOPRIL 2.5 MG PO TABS
2.5000 mg | ORAL_TABLET | Freq: Every morning | ORAL | Status: DC
  Administered 2014-02-19: 2.5 mg via ORAL
  Filled 2014-02-19 (×3): qty 1

## 2014-02-19 MED ORDER — HYDROMORPHONE HCL PF 1 MG/ML IJ SOLN
INTRAMUSCULAR | Status: AC
  Filled 2014-02-19: qty 1

## 2014-02-19 MED ORDER — ACETAMINOPHEN 10 MG/ML IV SOLN
1000.0000 mg | Freq: Once | INTRAVENOUS | Status: DC
  Filled 2014-02-19: qty 100

## 2014-02-19 MED ORDER — ACETAMINOPHEN 325 MG PO TABS: 650.0000 mg | ORAL_TABLET | Freq: Four times a day (QID) | ORAL | Status: DC | PRN

## 2014-02-19 MED ORDER — SUCCINYLCHOLINE CHLORIDE 20 MG/ML IJ SOLN: INTRAMUSCULAR | Status: DC | PRN

## 2014-02-19 MED ORDER — MIDAZOLAM HCL 2 MG/2ML IJ SOLN
INTRAMUSCULAR | Status: AC
  Filled 2014-02-19: qty 2

## 2014-02-19 MED ORDER — HYDROMORPHONE HCL PF 1 MG/ML IJ SOLN
INTRAMUSCULAR | Status: DC | PRN
  Administered 2014-02-19 (×2): 0.5 mg via INTRAVENOUS

## 2014-02-19 MED ORDER — PROPOFOL 10 MG/ML IV BOLUS
INTRAVENOUS | Status: AC
  Filled 2014-02-19: qty 20

## 2014-02-19 MED ORDER — STERILE WATER FOR IRRIGATION IR SOLN
Status: DC | PRN
  Administered 2014-02-19: 1500 mL

## 2014-02-19 MED ORDER — ALUM & MAG HYDROXIDE-SIMETH 200-200-20 MG/5ML PO SUSP: 30.0000 mL | ORAL | Status: DC | PRN

## 2014-02-19 MED ORDER — ALBUTEROL SULFATE HFA 108 (90 BASE) MCG/ACT IN AERS: 2.0000 | INHALATION_SPRAY | Freq: Four times a day (QID) | RESPIRATORY_TRACT | Status: DC | PRN

## 2014-02-19 MED ORDER — SODIUM CHLORIDE 0.9 % IJ SOLN
INTRAMUSCULAR | Status: AC
  Filled 2014-02-19: qty 50

## 2014-02-19 MED ORDER — ROFLUMILAST 500 MCG PO TABS
500.0000 ug | ORAL_TABLET | Freq: Every day | ORAL | Status: DC
  Administered 2014-02-19 – 2014-02-21 (×3): 500 ug via ORAL
  Filled 2014-02-19 (×3): qty 1

## 2014-02-19 MED ORDER — MOMETASONE FURO-FORMOTEROL FUM 200-5 MCG/ACT IN AERO
2.0000 | INHALATION_SPRAY | Freq: Two times a day (BID) | RESPIRATORY_TRACT | Status: DC
  Administered 2014-02-19 – 2014-02-20 (×3): 2 via RESPIRATORY_TRACT
  Filled 2014-02-19: qty 8.8

## 2014-02-19 MED ORDER — MENTHOL 3 MG MT LOZG: 1.0000 | LOZENGE | OROMUCOSAL | Status: DC | PRN

## 2014-02-19 MED ORDER — WARFARIN - PHARMACIST DOSING INPATIENT: Freq: Every day | Status: DC

## 2014-02-19 MED ORDER — ONDANSETRON HCL 4 MG/2ML IJ SOLN
INTRAMUSCULAR | Status: AC
  Filled 2014-02-19: qty 2

## 2014-02-19 MED ORDER — ONDANSETRON HCL 4 MG PO TABS: 4.0000 mg | ORAL_TABLET | Freq: Four times a day (QID) | ORAL | Status: DC | PRN

## 2014-02-19 MED ORDER — BISACODYL 5 MG PO TBEC: 5.0000 mg | DELAYED_RELEASE_TABLET | Freq: Every day | ORAL | Status: DC | PRN

## 2014-02-19 MED ORDER — WARFARIN SODIUM 7.5 MG PO TABS
7.5000 mg | ORAL_TABLET | Freq: Once | ORAL | Status: AC
  Administered 2014-02-19: 7.5 mg via ORAL
  Filled 2014-02-19: qty 1

## 2014-02-19 MED ORDER — FERROUS SULFATE 325 (65 FE) MG PO TABS
325.0000 mg | ORAL_TABLET | Freq: Three times a day (TID) | ORAL | Status: DC
  Administered 2014-02-20 – 2014-02-21 (×4): 325 mg via ORAL
  Filled 2014-02-19 (×8): qty 1

## 2014-02-19 MED ORDER — CEFAZOLIN SODIUM 1-5 GM-% IV SOLN
1.0000 g | Freq: Four times a day (QID) | INTRAVENOUS | Status: AC
  Administered 2014-02-19 (×2): 1 g via INTRAVENOUS
  Filled 2014-02-19 (×2): qty 50

## 2014-02-19 MED ORDER — HYDROMORPHONE HCL PF 1 MG/ML IJ SOLN
1.0000 mg | INTRAMUSCULAR | Status: DC | PRN
  Administered 2014-02-19: 1 mg via INTRAVENOUS
  Filled 2014-02-19: qty 1

## 2014-02-19 MED ORDER — GLYCOPYRROLATE 0.2 MG/ML IJ SOLN
INTRAMUSCULAR | Status: DC | PRN
  Administered 2014-02-19: 0.6 mg via INTRAVENOUS

## 2014-02-19 MED ORDER — BUPIVACAINE LIPOSOME 1.3 % IJ SUSP
20.0000 mL | Freq: Once | INTRAMUSCULAR | Status: DC
  Filled 2014-02-19: qty 20

## 2014-02-19 MED ORDER — OXYCODONE HCL 5 MG PO TABS: 5.0000 mg | ORAL_TABLET | Freq: Once | ORAL | Status: DC | PRN

## 2014-02-19 MED ORDER — THROMBIN 5000 UNITS EX SOLR
CUTANEOUS | Status: DC | PRN
  Administered 2014-02-19: 5000 [IU] via TOPICAL

## 2014-02-19 MED ORDER — ROCURONIUM BROMIDE 100 MG/10ML IV SOLN
INTRAVENOUS | Status: DC | PRN
  Administered 2014-02-19: 40 mg via INTRAVENOUS
  Administered 2014-02-19: 10 mg via INTRAVENOUS

## 2014-02-19 MED ORDER — NEOSTIGMINE METHYLSULFATE 10 MG/10ML IV SOLN
INTRAVENOUS | Status: DC | PRN
  Administered 2014-02-19: 4 mg via INTRAVENOUS

## 2014-02-19 MED ORDER — SODIUM CHLORIDE 0.9 % IR SOLN
Status: DC | PRN
  Administered 2014-02-19: 1000 mL

## 2014-02-19 MED ORDER — HYDRALAZINE HCL 20 MG/ML IJ SOLN
INTRAMUSCULAR | Status: DC | PRN
  Administered 2014-02-19: 5 mg via INTRAVENOUS

## 2014-02-19 MED ORDER — MIDAZOLAM HCL 5 MG/5ML IJ SOLN
INTRAMUSCULAR | Status: DC | PRN
  Administered 2014-02-19 (×4): 0.5 mg via INTRAVENOUS

## 2014-02-19 MED ORDER — HYDROMORPHONE HCL PF 1 MG/ML IJ SOLN
0.2500 mg | INTRAMUSCULAR | Status: DC | PRN
  Administered 2014-02-19: 0.25 mg via INTRAVENOUS
  Administered 2014-02-19 (×3): 0.5 mg via INTRAVENOUS
  Administered 2014-02-19: 0.25 mg via INTRAVENOUS

## 2014-02-19 MED ORDER — FENTANYL CITRATE 0.05 MG/ML IJ SOLN
INTRAMUSCULAR | Status: AC
  Filled 2014-02-19: qty 5

## 2014-02-19 MED ORDER — DEXTROSE 5 % IV SOLN
500.0000 mg | Freq: Four times a day (QID) | INTRAVENOUS | Status: DC | PRN
  Administered 2014-02-19 (×2): 500 mg via INTRAVENOUS
  Filled 2014-02-19 (×2): qty 5

## 2014-02-19 MED ORDER — PROMETHAZINE HCL 12.5 MG PO TABS
6.2500 mg | ORAL_TABLET | Freq: Four times a day (QID) | ORAL | Status: DC | PRN
  Filled 2014-02-19: qty 1

## 2014-02-19 MED ORDER — CEFAZOLIN SODIUM-DEXTROSE 2-3 GM-% IV SOLR
INTRAVENOUS | Status: AC
  Filled 2014-02-19: qty 50

## 2014-02-19 MED ORDER — ACETAMINOPHEN 650 MG RE SUPP: 650.0000 mg | Freq: Four times a day (QID) | RECTAL | Status: DC | PRN

## 2014-02-19 MED ORDER — LACTATED RINGERS IV SOLN
INTRAVENOUS | Status: DC
  Administered 2014-02-19: 15:00:00 via INTRAVENOUS

## 2014-02-19 SURGICAL SUPPLY — 65 items
BAG ZIPLOCK 12X15 (MISCELLANEOUS) ×3 IMPLANT
BANDAGE ELASTIC 4 VELCRO ST LF (GAUZE/BANDAGES/DRESSINGS) ×3 IMPLANT
BANDAGE ELASTIC 6 VELCRO ST LF (GAUZE/BANDAGES/DRESSINGS) ×3 IMPLANT
BANDAGE ESMARK 6X9 LF (GAUZE/BANDAGES/DRESSINGS) ×1 IMPLANT
BLADE SAG 18X100X1.27 (BLADE) ×3 IMPLANT
BLADE SAW SGTL 11.0X1.19X90.0M (BLADE) ×3 IMPLANT
BNDG ESMARK 6X9 LF (GAUZE/BANDAGES/DRESSINGS) ×3
BONE CEMENT GENTAMICIN (Cement) ×6 IMPLANT
CAPT RP KNEE ×3 IMPLANT
CEMENT BONE GENTAMICIN 40 (Cement) ×2 IMPLANT
CUFF TOURN SGL QUICK 34 (TOURNIQUET CUFF) ×2
CUFF TRNQT CYL 34X4X40X1 (TOURNIQUET CUFF) ×1 IMPLANT
DERMABOND ADVANCED (GAUZE/BANDAGES/DRESSINGS) ×2
DERMABOND ADVANCED .7 DNX12 (GAUZE/BANDAGES/DRESSINGS) ×1 IMPLANT
DRAPE EXTREMITY T 121X128X90 (DRAPE) ×3 IMPLANT
DRAPE INCISE IOBAN 66X45 STRL (DRAPES) ×3 IMPLANT
DRAPE LG THREE QUARTER DISP (DRAPES) ×3 IMPLANT
DRAPE POUCH INSTRU U-SHP 10X18 (DRAPES) ×3 IMPLANT
DRAPE U-SHAPE 47X51 STRL (DRAPES) ×3 IMPLANT
DRSG AQUACEL AG ADV 3.5X10 (GAUZE/BANDAGES/DRESSINGS) ×3 IMPLANT
DRSG PAD ABDOMINAL 8X10 ST (GAUZE/BANDAGES/DRESSINGS) IMPLANT
DRSG TEGADERM 4X4.75 (GAUZE/BANDAGES/DRESSINGS) IMPLANT
DURAPREP 26ML APPLICATOR (WOUND CARE) ×3 IMPLANT
ELECT REM PT RETURN 9FT ADLT (ELECTROSURGICAL) ×3
ELECTRODE REM PT RTRN 9FT ADLT (ELECTROSURGICAL) ×1 IMPLANT
EVACUATOR 1/8 PVC DRAIN (DRAIN) ×3 IMPLANT
FACESHIELD WRAPAROUND (MASK) ×15 IMPLANT
GAUZE SPONGE 2X2 8PLY STRL LF (GAUZE/BANDAGES/DRESSINGS) IMPLANT
GLOVE BIOGEL PI IND STRL 8 (GLOVE) ×1 IMPLANT
GLOVE BIOGEL PI INDICATOR 8 (GLOVE) ×2
GLOVE ECLIPSE 8.0 STRL XLNG CF (GLOVE) ×6 IMPLANT
GLOVE SURG SS PI 6.5 STRL IVOR (GLOVE) ×6 IMPLANT
GOWN STRL REUS W/TWL LRG LVL3 (GOWN DISPOSABLE) ×3 IMPLANT
GOWN STRL REUS W/TWL XL LVL3 (GOWN DISPOSABLE) ×3 IMPLANT
HANDPIECE INTERPULSE COAX TIP (DISPOSABLE) ×2
IMMOBILIZER KNEE 20 (SOFTGOODS) ×3
IMMOBILIZER KNEE 20 THIGH 36 (SOFTGOODS) ×1 IMPLANT
KIT BASIN OR (CUSTOM PROCEDURE TRAY) ×3 IMPLANT
MANIFOLD NEPTUNE II (INSTRUMENTS) ×3 IMPLANT
NEEDLE HYPO 22GX1.5 SAFETY (NEEDLE) ×3 IMPLANT
NS IRRIG 1000ML POUR BTL (IV SOLUTION) ×3 IMPLANT
PACK TOTAL JOINT (CUSTOM PROCEDURE TRAY) ×3 IMPLANT
PADDING CAST COTTON 6X4 STRL (CAST SUPPLIES) IMPLANT
POSITIONER SURGICAL ARM (MISCELLANEOUS) ×3 IMPLANT
SET HNDPC FAN SPRY TIP SCT (DISPOSABLE) ×1 IMPLANT
SPONGE GAUZE 2X2 STER 10/PKG (GAUZE/BANDAGES/DRESSINGS)
SPONGE LAP 18X18 X RAY DECT (DISPOSABLE) IMPLANT
SPONGE SURGIFOAM ABS GEL 100 (HEMOSTASIS) ×3 IMPLANT
STAPLER VISISTAT 35W (STAPLE) IMPLANT
SUCTION FRAZIER 12FR DISP (SUCTIONS) ×3 IMPLANT
SUT BONE WAX W31G (SUTURE) ×3 IMPLANT
SUT MNCRL AB 4-0 PS2 18 (SUTURE) ×3 IMPLANT
SUT VIC AB 1 CT1 27 (SUTURE) ×4
SUT VIC AB 1 CT1 27XBRD ANTBC (SUTURE) ×2 IMPLANT
SUT VIC AB 2-0 CT1 27 (SUTURE) ×6
SUT VIC AB 2-0 CT1 TAPERPNT 27 (SUTURE) ×3 IMPLANT
SUT VLOC 180 0 24IN GS25 (SUTURE) ×3 IMPLANT
SYR 20CC LL (SYRINGE) ×3 IMPLANT
TOWEL OR 17X26 10 PK STRL BLUE (TOWEL DISPOSABLE) ×6 IMPLANT
TOWER CARTRIDGE SMART MIX (DISPOSABLE) ×3 IMPLANT
TRAY FOLEY CATH 14FRSI W/METER (CATHETERS) IMPLANT
TRAY FOLEY CATH 16FR SILVER (SET/KITS/TRAYS/PACK) ×3 IMPLANT
TRAY FOLEY CATH 16FRSI W/METER (SET/KITS/TRAYS/PACK) ×3 IMPLANT
WATER STERILE IRR 1500ML POUR (IV SOLUTION) ×3 IMPLANT
WRAP KNEE MAXI GEL POST OP (GAUZE/BANDAGES/DRESSINGS) ×3 IMPLANT

## 2014-02-19 NOTE — Brief Op Note (Signed)
02/19/2014  10:27 AM  PATIENT:  [name]  79 y.o. 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  SURGEON:  Surgeon(s) and Role:    * Tobi Bastos, MD - Primary  PHYSICIAN ASSISTANT Ardeen Jourdain PA:   ASSISTANTS:Amber Constable PA  ANESTHESIA:   general  EBL:  Total I/O In: 1000 [I.V.:1000] Out: 1200 [Urine:1150; Blood:50]  BLOOD ADMINISTERED:none  DRAINS: (One) Hemovact drain(s) in the Right Knee with  Suction Open   LOCAL MEDICATIONS USED:  BUPIVICAINE 20cc wiyh 20cc of Normal Saline   SPECIMEN:  No Specimen  DISPOSITION OF SPECIMEN:  N/A  COUNTS:  YES  TOURNIQUET:  * Missing tourniquet times found for documented tourniquets in log:  [id number] *  DICTATION: .Other Dictation: Dictation Number [id number]  PLAN OF CARE: Admit to inpatient   PATIENT DISPOSITION:  Stable in OR   Delay start of Pharmacological VTE agent (>24hrs) due to surgical blood loss or risk of bleeding: yes

## 2014-02-19 NOTE — Evaluation (Signed)
Physical Therapy Evaluation Patient Details Name: [name] MRN: [id number] DOB: 11-22-1934 Today's Date: 02/19/2014   History of Present Illness     Clinical Impression  Pt s/p R TKR presents with decreased R LE strength/ROM and post op pain and nausea limiting functional mobility.  Pt should progress to d/c home with family assist and HHPT follow up.   Follow Up Recommendations Home health PT    Equipment Recommendations  None recommended by PT    Recommendations for Other Services OT consult     Precautions / Restrictions Precautions Precautions: Knee;Fall Required Braces or Orthoses: Knee Immobilizer - Right Knee Immobilizer - Right: Discontinue once straight leg raise with < 10 degree lag Restrictions Weight Bearing Restrictions: No Other Position/Activity Restrictions: WBAT      Mobility  Bed Mobility Overal bed mobility: +2 for physical assistance             General bed mobility comments: cues for sequence and use of L LE to self assist  Transfers Overall transfer level: Needs assistance Equipment used: Rolling walker (2 wheeled) Transfers: Sit to/from Stand Sit to Stand: Mod assist         General transfer comment: cues for LE management and use of UEs to self assist  Ambulation/Gait Ambulation/Gait assistance: Min assist;Mod assist Ambulation Distance (Feet): 22 Feet Assistive device: Rolling walker (2 wheeled) Gait Pattern/deviations: Step-to pattern;Decreased step length - right;Decreased step length - left;Shuffle;Trunk flexed     General Gait Details: cues for posture, position from RW and sequence  Stairs            Wheelchair Mobility    Modified Rankin (Stroke Patients Only)       Balance                                             Pertinent Vitals/Pain 3/10; premed, ice packs provided    Home Living Family/patient expects to be discharged to:: Private residence Living Arrangements:  Spouse/significant other Available Help at Discharge: Family Type of Home: Apartment Home Access: Level entry     Home Layout: One level Home Equipment: Environmental consultant - 2 wheels;Cane - single point      Prior Function Level of Independence: Independent               Hand Dominance   Dominant Hand: Right    Extremity/Trunk Assessment   Upper Extremity Assessment: Overall WFL for tasks assessed           Lower Extremity Assessment: RLE deficits/detail         Communication   Communication: No difficulties  Cognition Arousal/Alertness: Awake/alert Behavior During Therapy: WFL for tasks assessed/performed Overall Cognitive Status: Within Functional Limits for tasks assessed                      General Comments      Exercises        Assessment/Plan    PT Assessment Patient needs continued PT services  PT Diagnosis Difficulty walking   PT Problem List Decreased strength;Decreased range of motion;Decreased activity tolerance;Decreased mobility;Decreased knowledge of use of DME  PT Treatment Interventions DME instruction;Gait training;Stair training;Functional mobility training;Therapeutic activities;Therapeutic exercise;Patient/family education   PT Goals (Current goals can be found in the Care Plan section) Acute Rehab PT Goals Patient Stated Goal: Walk without pain PT Goal Formulation: With patient  Time For Goal Achievement: 02/26/14 Potential to Achieve Goals: Good    Frequency 7X/week   Barriers to discharge        Co-evaluation               End of Session Equipment Utilized During Treatment: Gait belt;Right knee immobilizer Activity Tolerance: Other (comment) (onset dizzy/nausea with ambulation) Patient left: in chair;with call bell/phone within reach;with family/visitor present Nurse Communication: Mobility status         Time: 2595-6387 PT Time Calculation (min): 25 min   Charges:   PT Evaluation $Initial PT Evaluation  Tier I: 1 Procedure PT Treatments $Gait Training: 8-22 mins   PT G Codes:          Lauralie Blacksher 02/19/2014, 5:54 PM

## 2014-02-19 NOTE — Transfer of Care (Signed)
Immediate Anesthesia Transfer of Care Note  Patient: [name]  Procedure(s) Performed: Procedure(s) (LRB): RIGHT TOTAL KNEE ARTHROPLASTY (Right)  Patient Location: PACU  Anesthesia Type: General  Level of Consciousness: sedated, patient cooperative and responds to stimulation  Airway & Oxygen Therapy: Patient Spontanous Breathing and Patient connected to face mask oxgen  Post-op Assessment: Report given to PACU RN and Post -op Vital signs reviewed and stable  Post vital signs: Reviewed and stable  Complications: No apparent anesthesia complications

## 2014-02-19 NOTE — Progress Notes (Signed)
Utilization review completed.  

## 2014-02-19 NOTE — Interval H&P Note (Signed)
History and Physical Interval Note:  02/19/2014 8:17 AM  [name]  has presented today for surgery, with the diagnosis of OA OF RIGHT KNEE  The various methods of treatment have been discussed with the patient and family. After consideration of risks, benefits and other options for treatment, the patient has consented to  Procedure(s): RIGHT TOTAL KNEE ARTHROPLASTY (Right) as a surgical intervention .  The patient's history has been reviewed, patient examined, no change in status, stable for surgery.  I have reviewed the patient's chart and labs.  Questions were answered to the patient's satisfaction.     [name] A

## 2014-02-19 NOTE — Anesthesia Postprocedure Evaluation (Signed)
  Anesthesia Post-op Note  Patient: [name]  Procedure(s) Performed: Procedure(s) (LRB): RIGHT TOTAL KNEE ARTHROPLASTY (Right)  Patient Location: PACU  Anesthesia Type: General  Level of Consciousness: awake and alert   Airway and Oxygen Therapy: Patient Spontanous Breathing  Post-op Pain: mild  Post-op Assessment: Post-op Vital signs reviewed, Patient's Cardiovascular Status Stable, Respiratory Function Stable, Patent Airway and No signs of Nausea or vomiting  Last Vitals:  Filed Vitals:   02/19/14 1315  BP:   Pulse:   Temp: 36.8 C  Resp:     Post-op Vital Signs: stable   Complications: No apparent anesthesia complications

## 2014-02-19 NOTE — Progress Notes (Signed)
ANTICOAGULATION CONSULT NOTE - Initial Consult  Pharmacy Consult for Warfarin Indication: History of Afib, VTE prophylaxis  No Known Allergies  Patient Measurements:    Vital Signs: Temp: 97.5 F (36.4 C) (06/10 1459) Temp src: Oral (06/10 1459) BP: 130/68 mmHg (06/10 1459) Pulse Rate: 64 (06/10 1459)  Labs:  Recent Labs  02/19/14 0620  LABPROT 15.1  INR 1.22    The CrCl is unknown because both a height and weight (above a minimum accepted value) are required for this calculation.   Medical History: Past Medical History  Diagnosis Date  . Atrial fibrillation     coumadin therapy;  echo 1/10: EF 65-70%, mild diast dysfxn  . Hyperlipidemia   . GERD (gastroesophageal reflux disease)   . Allergic rhinitis   . Asthma   . Anxiety   . CAD (coronary artery disease)     cath 7/09: pCFX 25%, mRCA 25%, EF 60%;  Myoview 6/11: low risk, EF 67%  . History of pneumonia   . Sebaceous cyst     scalp  . SVT (supraventricular tachycardia)     s/p RFCA 05/2010  . First degree AV block   . History of TIAs   . Stroke sev yrs ago    TIA  . Hiatal hernia   . COPD (chronic obstructive pulmonary disease)     admitted 5/11, uses oxygen 2 liters/min prn rare use  . Emphysema   . Tubular adenoma 2014    Dr. Hilarie Fredrickson  . Hypertension   . History of oxygen administration 02-11-14    2l/m nasally as needed-rarely needs  . Transfusion history     s/p hernia surgery    Medications:  Scheduled:  .  ceFAZolin (ANCEF) IV  1 g Intravenous Q6H  . ferrous sulfate  325 mg Oral TID PC  . HYDROmorphone      . HYDROmorphone      . lisinopril  2.5 mg Oral q morning - 10a  . mometasone-formoterol  2 puff Inhalation BID  . pantoprazole  40 mg Oral Daily  . roflumilast  500 mcg Oral Daily  . warfarin  7.5 mg Oral ONCE-1800  . Warfarin - Pharmacist Dosing Inpatient   Does not apply q1800   Infusions:  . lactated ringers     PRN: acetaminophen, acetaminophen, albuterol, alum & mag  hydroxide-simeth, bisacodyl, HYDROcodone-acetaminophen, HYDROmorphone (DILAUDID) injection, menthol-cetylpyridinium, methocarbamol (ROBAXIN) IV, methocarbamol, ondansetron (ZOFRAN) IV, ondansetron, oxyCODONE-acetaminophen, phenol, polyethylene glycol, sodium phosphate, traZODone  Assessment: 79 yo male on chronic warfarin for Afib. He is s/p right TKA on 6/10 and pharmacy is consulted to resume post op.  Home dose is 7.5mg  Mon, Tues, Thurs and Friday; 5mg  Sun, Wed, Sat. Last dose taken was 6/5  Pre-op INR 1.22  Pre-op CBC: Hgb slightly low but appears near his baseline; Plt wnl  Goal of Therapy:  INR 2-3    Plan:   Warfarin 7.5mg  PO x 1 today  Daily PT/INR  Peggyann Juba, PharmD, BCPS Pager: [phone] 02/19/2014,3:04 PM

## 2014-02-19 NOTE — Anesthesia Preprocedure Evaluation (Addendum)
Anesthesia Evaluation  Patient identified by MRN, date of birth, ID band Patient awake    Reviewed: Allergy & Precautions, H&P , NPO status , Patient's Chart, lab work & pertinent test results  Airway Mallampati: II TM Distance: >3 FB Neck ROM: Full    Dental  (+) Poor Dentition, Missing Only one tooth left.:   Pulmonary asthma , pneumonia -, COPD COPD inhaler, former smoker,  Oxygen at home, but he hardly ever uses it. breath sounds clear to auscultation  Pulmonary exam normal       Cardiovascular Exercise Tolerance: Good hypertension, Pt. on medications + CAD + dysrhythmias Atrial Fibrillation Rhythm:Regular Rate:Normal  Clearance Dr. Caryl Comes.   Neuro/Psych Anxiety TIA Neuromuscular disease negative psych ROS   GI/Hepatic Neg liver ROS, hiatal hernia, GERD-  Medicated,  Endo/Other  Hyperthyroidism   Renal/GU negative Renal ROS     Musculoskeletal negative musculoskeletal ROS (+)   Abdominal   Peds  Hematology negative hematology ROS (+)   Anesthesia Other Findings   Reproductive/Obstetrics                           Anesthesia Physical  Anesthesia Plan  ASA: III  Anesthesia Plan: General   Post-op Pain Management:    Induction: Intravenous  Airway Management Planned: Oral ETT  Additional Equipment:   Intra-op Plan:   Post-operative Plan: Extubation in OR  Informed Consent: I have reviewed the patients History and Physical, chart, labs and discussed the procedure including the risks, benefits and alternatives for the proposed anesthesia with the patient or authorized representative who has indicated his/her understanding and acceptance.   Dental advisory given  Plan Discussed with: CRNA  Anesthesia Plan Comments:         Anesthesia Quick Evaluation

## 2014-02-20 LAB — CBC
HEMATOCRIT: 28 % — AB (ref 39.0–52.0)
Hemoglobin: 9.2 g/dL — ABNORMAL LOW (ref 13.0–17.0)
MCH: 28.8 pg (ref 26.0–34.0)
MCHC: 32.9 g/dL (ref 30.0–36.0)
MCV: 87.8 fL (ref 78.0–100.0)
PLATELETS: 183 10*3/uL (ref 150–400)
RBC: 3.19 MIL/uL — ABNORMAL LOW (ref 4.22–5.81)
RDW: 13.1 % (ref 11.5–15.5)
WBC: 9.5 10*3/uL (ref 4.0–10.5)

## 2014-02-20 LAB — PROTIME-INR
INR: 1.29 (ref 0.00–1.49)
PROTHROMBIN TIME: 15.8 s — AB (ref 11.6–15.2)

## 2014-02-20 LAB — BASIC METABOLIC PANEL
BUN: 14 mg/dL (ref 6–23)
CO2: 29 mEq/L (ref 19–32)
Calcium: 8.6 mg/dL (ref 8.4–10.5)
Chloride: 99 mEq/L (ref 96–112)
Creatinine, Ser: 0.86 mg/dL (ref 0.50–1.35)
GFR calc Af Amer: >90 mL/min (ref >90)
GFR, EST NON AFRICAN AMERICAN: 82 mL/min — AB (ref >90)
Glucose, Bld: 138 mg/dL — ABNORMAL HIGH (ref 70–99)
POTASSIUM: 4.2 meq/L (ref 3.7–5.3)
Sodium: 138 mEq/L (ref 137–147)

## 2014-02-20 MED ORDER — WARFARIN SODIUM 7.5 MG PO TABS
7.5000 mg | ORAL_TABLET | Freq: Once | ORAL | Status: DC
  Filled 2014-02-20: qty 1

## 2014-02-20 MED ORDER — METHOCARBAMOL 500 MG PO TABS: 500.0000 mg | ORAL_TABLET | Freq: Four times a day (QID) | ORAL | Status: DC | PRN

## 2014-02-20 MED ORDER — OXYCODONE-ACETAMINOPHEN 5-325 MG PO TABS: 1.0000 | ORAL_TABLET | ORAL | Status: DC | PRN

## 2014-02-20 MED ORDER — SODIUM CHLORIDE 0.9 % IV BOLUS (SEPSIS)
250.0000 mL | Freq: Once | INTRAVENOUS | Status: AC
  Administered 2014-02-20: 250 mL via INTRAVENOUS

## 2014-02-20 MED ORDER — FERROUS SULFATE 325 (65 FE) MG PO TABS: 325.0000 mg | ORAL_TABLET | Freq: Three times a day (TID) | ORAL | Status: DC

## 2014-02-20 NOTE — Op Note (Signed)
NAMEVOSHON, PETRO NO.:  [id number]  MEDICAL RECORD NO.:  [id number]  LOCATION:  4010                         FACILITY:  [facility]  PHYSICIAN:  Kipp Brood. Bird Tailor, M.D.DATE OF BIRTH:  16-Mar-1935  DATE OF PROCEDURE:  02/19/2014 DATE OF DISCHARGE:                              OPERATIVE REPORT   SURGEON:  Kipp Brood. Gladstone Lighter, M.D.  ASSISTANT:  Ardeen Jourdain, [place].  PREOPERATIVE DIAGNOSES: 1. Severe flexion contracture, right knee. 2. Severe bone-on-bone degenerative arthritis, right knee.  POSTOPERATIVE DIAGNOSES: 1. Severe flexion contracture, right knee. 2. Severe bone-on-bone degenerative arthritis, right knee.  OPERATION: 1. Release of flexion contractures. 2. Right total knee arthroplasty utilizing a size 4 tibial tray with a     10 mm thickness size 5 rotating platform insert, the femoral     component was a size 5, right, the patella was a size 41 with 3     pegs.  Gentamicin was used in the cement and all 3 components were     cemented.  PROCEDURE:  Under general anesthesia, routine orthopedic prep and draping of the right lower extremity was carried out.  The leg was exsanguinated with an Esmarch, tourniquet was elevated to 325 mmHg.  At this time, the appropriate time-out was first carried out.  I also marked the appropriate right leg in the holding area.  After this, the knee was flexed and an incision was made anteriorly.  I created 2 flaps. I then carried out a median parapatellar incision, reflected the patella laterally.  At this time, with the knee flexed, I did medial and lateral meniscectomies and excised the anterior and posterior cruciate ligaments.  The knee was extremely tight, so , I did a nice medial release, as well as removing the entire posterior cruciate.  Initial drill hole was made in the intercondylar notch.  I then made the appropriate measurements, we removed 13 mm thickness of bone distally. I then measured the femur to be a  size 5 right.  We cut anterior- posterior chamfering cuts for a size right femur.  Following that, I then went on to prepare the tibia for a size 4 tibial tray.  Note, the medial side of the tibia was extremely hard.  We had to make multiple passes to even that area out.  Once this was done, we irrigated out the knee and inserted our flexion/extension gap guides.  We still had a slight flexion contracture, we definitely improved that with our releases.  Following that, we then continued on, cut our keel, cut out of the proximal tibia.  I did remove approximately 8 mm thickness off the tibia because of the severe contractures.  Then, I went on and did my keel cut.  Following that, we did our notch cut of the distal femur in the usual fashion.  Trial components were inserted.  We lacked about 5 degrees of complete extension despite all the releases and bone cuts. Following that, we then did a resurfacing procedure on the patella in the usual fashion for a size 41 patella.  Three drill holes were made in the articular surface of the patella.  All components were removed.  We thoroughly water picked  out the knee, dried the knee out, and then cemented all 3 components in simultaneously with gentamicin in the cement.  The sizes used were measured as mentioned above.  After that the cement was hardened, I then removed the trial tibial component and then inserted my permanent rotating platform, reduced the knee, had excellent motion and excellent stability.  I then water picked out the knee again and then closed the wound in layers in usual fashion over Hemovac drain, and we injected a mixture of 20 mL of Exparel and 20 mL of normal saline.  Sterile dressings were applied. He did have 2 g of IV Ancef preop.          ______________________________ Kipp Brood. Gladstone Lighter, M.D.     RAG/MEDQ  D:  02/19/2014  T:  02/19/2014  Job:  [id number]

## 2014-02-20 NOTE — Evaluation (Signed)
Occupational Therapy Evaluation Patient Details Name: [name] MRN: [id number] DOB: 05/18/1935 Today's Date: 02/20/2014    History of Present Illness Pt is s/p RIGHT TOTAL KNEE ARTHROPLASTY with PMH of anxiety, COPD, afib.   Clinical Impression   Pt is moving well and is overall at min assist level with ADL. His wife was present for education and can assist PRN at home including LB dressing. Will benefit from continued OT to progress ADL independence.     Follow Up Recommendations  No OT follow up;Supervision/Assistance - 24 hour    Equipment Recommendations  3 in 1 bedside comode    Recommendations for Other Services       Precautions / Restrictions Precautions Precautions: Knee;Fall Required Braces or Orthoses: Knee Immobilizer - Right Knee Immobilizer - Right: Discontinue once straight leg raise with < 10 degree lag Restrictions Weight Bearing Restrictions: No Other Position/Activity Restrictions: WBAT      Mobility Bed Mobility                  Transfers Overall transfer level: Needs assistance Equipment used: Rolling walker (2 wheeled) Transfers: Sit to/from Stand Sit to Stand: Min assist         General transfer comment: verbal cues for hand placement and LE management. At times tries to sit down before fully backed up to chair.    Balance                                            ADL Overall ADL's : Needs assistance/impaired Eating/Feeding: Independent;Sitting   Grooming: Wash/dry hands;Set up;Sitting   Upper Body Bathing: Set up;Sitting   Lower Body Bathing: Minimal assistance;Sit to/from stand   Upper Body Dressing : Set up;Sitting   Lower Body Dressing: Minimal assistance;Sit to/from stand   Toilet Transfer: Minimal assistance;Ambulation;BSC;RW   Toileting- Clothing Manipulation and Hygiene: Minimal assistance;Sit to/from stand         General ADL Comments: Discussed tub transfers and DME options. Pt's  wife has used a tub transfer bench in the past but isnt sure of where it is right now. Pt agreeable to sponge bathe initially and regain more strength/ROM and let HH assess tub transfer when pt ready. Pt's wife willing to assist with LB self care. Reviewed sequence for LB dressing. Pt is very motivated. Tends to move a little quickly at times and needs cues for walker safety especially with turns.      Vision                     Perception     Praxis      Pertinent Vitals/Pain No complaint of     Hand Dominance Right   Extremity/Trunk Assessment Upper Extremity Assessment Upper Extremity Assessment: Overall WFL for tasks assessed           Communication Communication Communication: No difficulties   Cognition Arousal/Alertness: Awake/alert Behavior During Therapy: WFL for tasks assessed/performed Overall Cognitive Status: Within Functional Limits for tasks assessed                     General Comments       Exercises       Shoulder Instructions      Home Living Family/patient expects to be discharged to:: Private residence Living Arrangements: Spouse/significant other Available Help at Discharge: Family Type of Home: Apartment  Home Access: Level entry     Home Layout: One level     Bathroom Shower/Tub: Teacher, early years/pre: Handicapped height     Home Equipment: Environmental consultant - 2 wheels;Cane - single point          Prior Functioning/Environment Level of Independence: Independent             OT Diagnosis: Generalized weakness   OT Problem List: Decreased strength;Decreased knowledge of use of DME or AE   OT Treatment/Interventions: Self-care/ADL training;Patient/family education;Therapeutic activities;DME and/or AE instruction    OT Goals(Current goals can be found in the care plan section) Acute Rehab OT Goals Patient Stated Goal: return home OT Goal Formulation: With patient/family Time For Goal Achievement:  02/27/14 Potential to Achieve Goals: Good  OT Frequency: Min 2X/week   Barriers to D/C:            Co-evaluation              End of Session Equipment Utilized During Treatment: Gait belt;Rolling walker;Right knee immobilizer  Activity Tolerance: Patient tolerated treatment well Patient left: in chair;with call bell/phone within reach;with family/visitor present   Time: 1749-4496 OT Time Calculation (min): 29 min Charges:  OT General Charges $OT Visit: 1 Procedure OT Evaluation $Initial OT Evaluation Tier I: 1 Procedure OT Treatments $Self Care/Home Management : 8-22 mins $Therapeutic Activity: 8-22 mins G-Codes:    Jules Schick [phone] 02/20/2014, 11:16 AM

## 2014-02-20 NOTE — Progress Notes (Signed)
Physical Therapy Treatment Patient Details Name: BUCKY GRIGG MRN: [id number] DOB: 12/23/1934 Today's Date: 02/20/2014    History of Present Illness Pt is s/p RIGHT TOTAL KNEE ARTHROPLASTY with PMH of anxiety, COPD, afib.    PT Comments    Motivated and progressing well.  Follow Up Recommendations  Home health PT     Equipment Recommendations  None recommended by PT    Recommendations for Other Services OT consult     Precautions / Restrictions Precautions Precautions: Knee;Fall Required Braces or Orthoses: Knee Immobilizer - Right Knee Immobilizer - Right: Discontinue once straight leg raise with < 10 degree lag Restrictions Weight Bearing Restrictions: No Other Position/Activity Restrictions: WBAT    Mobility  Bed Mobility Overal bed mobility: Needs Assistance Bed Mobility: Sit to Supine       Sit to supine: Min guard   General bed mobility comments: cues for sequence and use of L LE to self assist  Transfers Overall transfer level: Needs assistance Equipment used: Rolling walker (2 wheeled) Transfers: Sit to/from Stand Sit to Stand: Min guard         General transfer comment: verbal cues for hand placement and LE management.   Ambulation/Gait Ambulation/Gait assistance: Min assist;Min guard Ambulation Distance (Feet): 200 Feet Assistive device: Rolling walker (2 wheeled) Gait Pattern/deviations: Step-to pattern;Step-through pattern;Shuffle;Trunk flexed     General Gait Details: cues for posture, position from RW and sequence   Stairs            Wheelchair Mobility    Modified Rankin (Stroke Patients Only)       Balance                                    Cognition Arousal/Alertness: Awake/alert Behavior During Therapy: WFL for tasks assessed/performed Overall Cognitive Status: Within Functional Limits for tasks assessed                      Exercises Total Joint Exercises Ankle Circles/Pumps:  AROM;Both;10 reps;Supine Quad Sets: AROM;10 reps;Both;Supine Heel Slides: AAROM;10 reps;Supine;Left Straight Leg Raises: AAROM;AROM;10 reps;Right;Supine Goniometric ROM: AAROM at R knee - 10 - 60    General Comments        Pertinent Vitals/Pain 3/10; premed    Home Living Family/patient expects to be discharged to:: Private residence Living Arrangements: Spouse/significant other Available Help at Discharge: Family Type of Home: Apartment Home Access: Level entry   Home Layout: One level Home Equipment: Environmental consultant - 2 wheels;Cane - single point      Prior Function Level of Independence: Independent          PT Goals (current goals can now be found in the care plan section) Acute Rehab PT Goals Patient Stated Goal: return home PT Goal Formulation: With patient Time For Goal Achievement: 02/26/14 Potential to Achieve Goals: Good Progress towards PT goals: Progressing toward goals    Frequency  7X/week    PT Plan Current plan remains appropriate    Co-evaluation             End of Session Equipment Utilized During Treatment: Gait belt;Right knee immobilizer Activity Tolerance: Patient tolerated treatment well Patient left: in bed;with call bell/phone within reach;with family/visitor present     Time: 4854-6270 PT Time Calculation (min): 27 min  Charges:  $Gait Training: 8-22 mins $Therapeutic Exercise: 8-22 mins  G Codes:      Garima Chronis 2014/03/22, 2:18 PM

## 2014-02-20 NOTE — Progress Notes (Signed)
Nutrition Brief Note  Patient identified on the Malnutrition Screening Tool (MST) Report  Wt Readings from Last 15 Encounters:  02/19/14 168 lb 4 oz (76.318 kg)  02/19/14 168 lb 4 oz (76.318 kg)  02/11/14 168 lb 4 oz (76.318 kg)  12/10/13 167 lb (75.751 kg)  08/15/13 173 lb 3.2 oz (78.563 kg)  08/05/13 172 lb 12.8 oz (78.382 kg)  06/28/13 174 lb (78.926 kg)  06/10/13 169 lb 8.5 oz (76.9 kg)  05/09/13 171 lb (77.565 kg)  04/30/13 178 lb 6.4 oz (80.922 kg)  04/15/13 196 lb 3.4 oz (89 kg)  04/15/13 196 lb 3.4 oz (89 kg)  04/08/13 174 lb 3.2 oz (79.017 kg)  03/29/13 178 lb 3.2 oz (80.831 kg)  03/07/13 176 lb 6.4 oz (80.015 kg)    Body mass index is 23.14 kg/(m^2). Patient meets criteria for normal weight based on current BMI.   Current diet order is regular, patient is consuming approximately regular% of meals at this time. Labs and medications reviewed.   No nutrition interventions warranted at this time. If nutrition issues arise, please consult RD.   Antonieta Iba, RD, LDN Clinical Inpatient Dietitian Pager:  [phone] Weekend and after hours pager:  [phone]

## 2014-02-20 NOTE — Progress Notes (Signed)
Subjective: 1 Day Post-Op Procedure(s) (LRB): RIGHT TOTAL KNEE ARTHROPLASTY (Right) Patient reports pain as mild.   Patient seen in rounds with Dr. Gladstone Lighter. Patient is well, and has had no acute complaints or problems. Reports that he is doing well. Got some rest last night. No issues overnight.  We will start therapy today.  Plan is to go Home after hospital stay.  Objective: Vital signs in last 24 hours: Temp:  [97.1 F (36.2 C)-98.4 F (36.9 C)] 98.4 F (36.9 C) (06/11 0625) Pulse Rate:  [59-85] 67 (06/11 0625) Resp:  [13-19] 16 (06/11 0625) BP: (88-196)/(50-89) 99/56 mmHg (06/11 0625) SpO2:  [96 %-100 %] 97 % (06/11 0625) Weight:  [76.318 kg (168 lb 4 oz)] 76.318 kg (168 lb 4 oz) (06/10 1346)  Intake/Output from previous day:  Intake/Output Summary (Last 24 hours) at 02/20/14 0803 Last data filed at 02/20/14 0631  Gross per 24 hour  Intake   4010 ml  Output   5485 ml  Net  -1475 ml     Labs:  Recent Labs  02/20/14 0415  HGB 9.2*    Recent Labs  02/20/14 0415  WBC 9.5  RBC 3.19*  HCT 28.0*  PLT 183    Recent Labs  02/20/14 0415  NA 138  K 4.2  CL 99  CO2 29  BUN 14  CREATININE 0.86  GLUCOSE 138*  CALCIUM 8.6    Recent Labs  02/19/14 0620 02/20/14 0415  INR 1.22 1.29    EXAM General - Patient is Alert and Oriented Extremity - Neurologically intact Intact pulses distally Dorsiflexion/Plantar flexion intact Compartment soft Dressing - dressing C/D/I Motor Function - intact, moving foot and toes well on exam.  Hemovac pulled without difficulty.  Past Medical History  Diagnosis Date  . Atrial fibrillation     coumadin therapy;  echo 1/10: EF 65-70%, mild diast dysfxn  . Hyperlipidemia   . GERD (gastroesophageal reflux disease)   . Allergic rhinitis   . Asthma   . Anxiety   . CAD (coronary artery disease)     cath 7/09: pCFX 25%, mRCA 25%, EF 60%;  Myoview 6/11: low risk, EF 67%  . History of pneumonia   . Sebaceous cyst    scalp  . SVT (supraventricular tachycardia)     s/p RFCA 05/2010  . First degree AV block   . History of TIAs   . Stroke sev yrs ago    TIA  . Hiatal hernia   . COPD (chronic obstructive pulmonary disease)     admitted 5/11, uses oxygen 2 liters/min prn rare use  . Emphysema   . Tubular adenoma 2014    Dr. Hilarie Fredrickson  . Hypertension   . History of oxygen administration 02-11-14    2l/m nasally as needed-rarely needs  . Transfusion history     s/p hernia surgery    Assessment/Plan: 1 Day Post-Op Procedure(s) (LRB): RIGHT TOTAL KNEE ARTHROPLASTY (Right) Active Problems:   Osteoarthritis of right knee   Total knee replacement status  Estimated body mass index is 23.14 kg/(m^2) as calculated from the following:   Height as of this encounter: 5' 11.5" (1.816 m).   Weight as of this encounter: 76.318 kg (168 lb 4 oz). Advance diet Up with therapy D/C IV fluids when tolerating POs well  DVT Prophylaxis - Xarelto Weight-Bearing as tolerated D/C O2 and Pulse OX and try on Room Air  Hold lisinopril this morning. Continue PT. Possible DC home tomorrow.   [employer],  PA-C Orthopaedic Surgery 02/20/2014, 8:03 AM

## 2014-02-20 NOTE — Progress Notes (Signed)
Physical Therapy Treatment Patient Details Name: [name] MRN: [id number] DOB: [birth year] Today's Date: 02/20/2014    History of Present Illness Pt is s/p RIGHT TOTAL KNEE ARTHROPLASTY with PMH of anxiety, COPD, afib.    PT Comments      Follow Up Recommendations  Home health PT     Equipment Recommendations  None recommended by PT    Recommendations for Other Services OT consult     Precautions / Restrictions Precautions Precautions: Knee;Fall Required Braces or Orthoses: Knee Immobilizer - Right Knee Immobilizer - Right: Discontinue once straight leg raise with < 10 degree lag Restrictions Weight Bearing Restrictions: No Other Position/Activity Restrictions: WBAT    Mobility  Bed Mobility Overal bed mobility: Needs Assistance Bed Mobility: Supine to Sit     Supine to sit: Min assist     General bed mobility comments: cues for sequence and use of L LE to self assist  Transfers Overall transfer level: Needs assistance Equipment used: Rolling walker (2 wheeled) Transfers: Sit to/from Stand Sit to Stand: Min assist         General transfer comment: verbal cues for hand placement and LE management.   Ambulation/Gait Ambulation/Gait assistance: Min assist Ambulation Distance (Feet): 200 Feet Assistive device: Rolling walker (2 wheeled) Gait Pattern/deviations: Step-to pattern;Step-through pattern     General Gait Details: cues for posture, position from RW and sequence   Stairs            Wheelchair Mobility    Modified Rankin (Stroke Patients Only)       Balance                                    Cognition Arousal/Alertness: Awake/alert Behavior During Therapy: WFL for tasks assessed/performed Overall Cognitive Status: Within Functional Limits for tasks assessed                      Exercises Total Joint Exercises Ankle Circles/Pumps: AROM;Both;10 reps;Supine Quad Sets: AROM;10 reps;Both;Supine Heel  Slides: AAROM;10 reps;Supine;Left Straight Leg Raises: AAROM;AROM;10 reps;Right;Supine Goniometric ROM: AAROM R knee -1 0 - 50    General Comments        Pertinent Vitals/Pain Min c/o pain; premed, cold packs provided    Home Living Family/patient expects to be discharged to:: Private residence Living Arrangements: Spouse/significant other Available Help at Discharge: Family Type of Home: Apartment Home Access: Level entry   Home Layout: One level Home Equipment: Environmental consultant - 2 wheels;Cane - single point      Prior Function Level of Independence: Independent          PT Goals (current goals can now be found in the care plan section) Acute Rehab PT Goals Patient Stated Goal: return home PT Goal Formulation: With patient Time For Goal Achievement: 02/26/14 Potential to Achieve Goals: Good Progress towards PT goals: Progressing toward goals    Frequency  7X/week    PT Plan Current plan remains appropriate    Co-evaluation             End of Session Equipment Utilized During Treatment: Gait belt;Right knee immobilizer Activity Tolerance: Patient tolerated treatment well Patient left: in chair;with call bell/phone within reach;with family/visitor present     Time: 0835-0909 PT Time Calculation (min): 34 min  Charges:  $Gait Training: 8-22 mins $Therapeutic Exercise: 8-22 mins  G Codes:      Takiyah Bohnsack March 21, 2014, 12:11 PM

## 2014-02-20 NOTE — Discharge Instructions (Signed)
Walk with your walker. Weight bearing as tolerated [facility] will follow you at home for your therapy and to manage your Coumadin. Change your dressing daily. Shower only, no tub bath. Call if any temperatures greater than 101 or any wound complications: [phone]

## 2014-02-20 NOTE — Progress Notes (Signed)
[facility] for Warfarin Indication: History of Afib, CVA;  VTE prophylaxis  No Known Allergies  Patient Measurements: Height: 5' 11.5" (181.6 cm) Weight: 168 lb 4 oz (76.318 kg) IBW/kg (Calculated) : 76.45  Vital Signs: Temp: 98.4 F (36.9 C) (06/11 0625) Temp src: Oral (06/11 0625) BP: 99/56 mmHg (06/11 0625) Pulse Rate: 67 (06/11 0625)  Labs:  Recent Labs  02/19/14 0620 02/20/14 0415  HGB  --  9.2*  HCT  --  28.0*  PLT  --  183  LABPROT 15.1 15.8*  INR 1.22 1.29  CREATININE  --  0.86    Estimated Creatinine Clearance: 78.9 ml/min (by C-G formula based on Cr of 0.86).   Medical History: Past Medical History  Diagnosis Date  . Atrial fibrillation     coumadin therapy;  echo 1/10: EF 65-70%, mild diast dysfxn  . Hyperlipidemia   . GERD (gastroesophageal reflux disease)   . Allergic rhinitis   . Asthma   . Anxiety   . CAD (coronary artery disease)     cath 7/09: pCFX 25%, mRCA 25%, EF 60%;  Myoview 6/11: low risk, EF 67%  . History of pneumonia   . Sebaceous cyst     scalp  . SVT (supraventricular tachycardia)     s/p RFCA 05/2010  . First degree AV block   . History of TIAs   . Stroke sev yrs ago    TIA  . Hiatal hernia   . COPD (chronic obstructive pulmonary disease)     admitted 5/11, uses oxygen 2 liters/min prn rare use  . Emphysema   . Tubular adenoma 2014    Dr. Hilarie Fredrickson  . Hypertension   . History of oxygen administration 02-11-14    2l/m nasally as needed-rarely needs  . Transfusion history     s/p hernia surgery    Medications:  Scheduled:  . ferrous sulfate  325 mg Oral TID PC  . lisinopril  2.5 mg Oral q morning - 10a  . mometasone-formoterol  2 puff Inhalation BID  . pantoprazole  40 mg Oral Daily  . roflumilast  500 mcg Oral Daily  . Warfarin - Pharmacist Dosing Inpatient   Does not apply q1800   Infusions:  . lactated ringers 100 mL/hr at 02/19/14 1524   PRN: acetaminophen, acetaminophen,  albuterol, alum & mag hydroxide-simeth, bisacodyl, HYDROcodone-acetaminophen, HYDROmorphone (DILAUDID) injection, menthol-cetylpyridinium, methocarbamol (ROBAXIN) IV, methocarbamol, ondansetron (ZOFRAN) IV, ondansetron, oxyCODONE-acetaminophen, phenol, polyethylene glycol, promethazine, promethazine, traZODone  Inpatient warfarin doses administered:  6/10: 7.5mg   Assessment: 79 yo male on chronic warfarin for Afib and hx CVA. He is s/p right TKA on 6/10 and pharmacy is consulted to resume warfarin post op.  He reportedly received preoperative prophylactic-dose Lovenox bridging (40mg  SQ daily, last dose 6/9). Home warfarin dose is 7.5mg  Mon, Tues, Thurs and Friday; 5mg  Sun, Wed, Sat. Last dose taken was 6/5.  6/11: POD #1 INR beginning to respond after resuming warfarin. Hgb low but not unexpected following TKA.  No overt bleeding mentioned in chart notes. Pltc WNL. No significant DDIs. Regular diet, charted as taking small amounts.  Goal of Therapy:  INR 2-3    Plan:   Warfarin 7.5mg  PO x 1 today  Daily PT/INR while inpatient  Clayburn Pert, PharmD, BCPS Pager: [phone] 02/20/2014  6:41 AM

## 2014-02-21 DIAGNOSIS — D62 Acute posthemorrhagic anemia: Secondary | ICD-10-CM | POA: Diagnosis not present

## 2014-02-21 LAB — CBC
HCT: 25.7 % — ABNORMAL LOW (ref 39.0–52.0)
HEMOGLOBIN: 8.6 g/dL — AB (ref 13.0–17.0)
MCH: 29.5 pg (ref 26.0–34.0)
MCHC: 33.5 g/dL (ref 30.0–36.0)
MCV: 88 fL (ref 78.0–100.0)
PLATELETS: 159 10*3/uL (ref 150–400)
RBC: 2.92 MIL/uL — ABNORMAL LOW (ref 4.22–5.81)
RDW: 13.6 % (ref 11.5–15.5)
WBC: 9.7 10*3/uL (ref 4.0–10.5)

## 2014-02-21 LAB — BASIC METABOLIC PANEL
BUN: 12 mg/dL (ref 6–23)
CO2: 29 mEq/L (ref 19–32)
Calcium: 8.6 mg/dL (ref 8.4–10.5)
Chloride: 105 mEq/L (ref 96–112)
Creatinine, Ser: 0.78 mg/dL (ref 0.50–1.35)
GFR calc non Af Amer: 85 mL/min — ABNORMAL LOW (ref >90)
Glucose, Bld: 120 mg/dL — ABNORMAL HIGH (ref 70–99)
POTASSIUM: 3.7 meq/L (ref 3.7–5.3)
Sodium: 141 mEq/L (ref 137–147)

## 2014-02-21 LAB — PROTIME-INR
INR: 1.79 — ABNORMAL HIGH (ref 0.00–1.49)
PROTHROMBIN TIME: 20.3 s — AB (ref 11.6–15.2)

## 2014-02-21 MED ORDER — ENOXAPARIN SODIUM 40 MG/0.4ML ~~LOC~~ SOLN: 40.0000 mg | SUBCUTANEOUS | Status: DC

## 2014-02-21 NOTE — Progress Notes (Signed)
Physical Therapy Treatment Patient Details Name: [name] MRN: [id number] DOB: [birth year] Today's Date: 20-Mar-2014    History of Present Illness Pt is s/p RIGHT TOTAL KNEE ARTHROPLASTY with PMH of anxiety, COPD, afib.    PT Comments    Limited progress 2* increased discomfort this date.  Pt continues motivated and cooperative and eager for d/c home   Follow Up Recommendations        Equipment Recommendations       Recommendations for Other Services       Precautions / Restrictions Precautions Precautions: Knee;Fall Required Braces or Orthoses: Knee Immobilizer - Right Knee Immobilizer - Right: Discontinue once straight leg raise with < 10 degree lag Restrictions Other Position/Activity Restrictions: WBAT    Mobility  Bed Mobility           Sit to supine: Min assist   General bed mobility comments: assist for RLE  Transfers       Sit to Stand: Min guard         General transfer comment: vcs for LE placement  Ambulation/Gait                 Stairs            Wheelchair Mobility    Modified Rankin (Stroke Patients Only)       Balance                                    Cognition Arousal/Alertness: Awake/alert Behavior During Therapy: WFL for tasks assessed/performed Overall Cognitive Status: Within Functional Limits for tasks assessed                      Exercises      General Comments        Pertinent Vitals/Pain 6/10; premed, cold packs provided    Home Living                      Prior Function            PT Goals (current goals can now be found in the care plan section)      Frequency       PT Plan      Co-evaluation             End of Session           Time:  -     Charges:                       G Codes:      Clova Morlock Mar 20, 2014, 12:42 PM

## 2014-02-21 NOTE — Progress Notes (Signed)
Occupational Therapy Treatment Patient Details Name: [name] MRN: [id number] DOB: July 05, 1935 Today's Date: 02/21/2014    History of present illness Pt is s/p RIGHT TOTAL KNEE ARTHROPLASTY with PMH of anxiety, COPD, afib.   OT comments  Pt progressing:  Needs cues for walker safety.  Wife verbalizes understanding  Follow Up Recommendations  No OT follow up;Supervision/Assistance - 24 hour    Equipment Recommendations  3 in 1 bedside comode    Recommendations for Other Services      Precautions / Restrictions Precautions Precautions: Knee;Fall Required Braces or Orthoses: Knee Immobilizer - Right Knee Immobilizer - Right: Discontinue once straight leg raise with < 10 degree lag Restrictions Other Position/Activity Restrictions: WBAT       Mobility Bed Mobility           Sit to supine: Min assist   General bed mobility comments: assist for RLE  Transfers       Sit to Stand: Min guard         General transfer comment: vcs for LE placement    Balance                                   ADL Overall ADL's : Needs assistance/impaired                         Toilet Transfer: Min guard;Ambulation;RW;BSC             General ADL Comments: cues for extending leg when sitting and walker distance:  pt tends to take large steps, stepping past wheels.  Pt also cued to side step through tight space to commode.  He initially tried to move walker a full arms length away.        Vision                     Perception     Praxis      Cognition   Behavior During Therapy: [facility] for tasks assessed/performed Overall Cognitive Status: Within Functional Limits for tasks assessed                       Extremity/Trunk Assessment               Exercises     Shoulder Instructions       General Comments      Pertinent Vitals/ Pain       7/10 LLE.  Wanted to work with OT now so he can leave ASAP.   Repositioned.  Pt had just removed ice  Home Living                                          Prior Functioning/Environment              Frequency Min 2X/week     Progress Toward Goals  OT Goals(current goals can now be found in the care plan section)  Progress towards OT goals: Progressing toward goals     Plan      Co-evaluation                 End of Session     Activity Tolerance Patient tolerated treatment well   Patient Left in chair;with call bell/phone within reach;with  family/visitor present   Nurse Communication          Time: [phone] OT Time Calculation (min): 16 min  Charges: OT General Charges $OT Visit: 1 Procedure OT Treatments $Self Care/Home Management : 8-22 mins  Wilkin Lippy 02/21/2014, 9:49 AM  Lesle Chris, OTR/L [phone] 02/21/2014

## 2014-02-21 NOTE — Discharge Summary (Signed)
Physician Discharge Summary   Patient ID: [name] MRN: [id number] DOB/AGE: July 05, 1935 79 y.o.  Admit date: 02/19/2014 Discharge date: 02/21/2014  Primary Diagnosis: Osteoarthritis, right knee  Admission Diagnoses:  Past Medical History  Diagnosis Date  . Atrial fibrillation     coumadin therapy;  echo 1/10: EF 65-70%, mild diast dysfxn  . Hyperlipidemia   . GERD (gastroesophageal reflux disease)   . Allergic rhinitis   . Asthma   . Anxiety   . CAD (coronary artery disease)     cath 7/09: pCFX 25%, mRCA 25%, EF 60%;  Myoview 6/11: low risk, EF 67%  . History of pneumonia   . Sebaceous cyst     scalp  . SVT (supraventricular tachycardia)     s/p RFCA 05/2010  . First degree AV block   . History of TIAs   . Stroke sev yrs ago    TIA  . Hiatal hernia   . COPD (chronic obstructive pulmonary disease)     admitted 5/11, uses oxygen 2 liters/min prn rare use  . Emphysema   . Tubular adenoma 2014    Dr. Hilarie Fredrickson  . Hypertension   . History of oxygen administration 02-11-14    2l/m nasally as needed-rarely needs  . Transfusion history     s/p hernia surgery   Discharge Diagnoses:   Active Problems:   Osteoarthritis of right knee   Total knee replacement status   Postoperative anemia due to acute blood loss  Estimated body mass index is 23.14 kg/(m^2) as calculated from the following:   Height as of this encounter: 5' 11.5" (1.816 m).   Weight as of this encounter: 76.318 kg (168 lb 4 oz).  Procedure:  Procedure(s) (LRB): RIGHT TOTAL KNEE ARTHROPLASTY (Right)   Consults: None  HPI: [name], 79 y.o. male, has a history of pain and functional disability in the right knee due to arthritis and has failed non-surgical conservative treatments for greater than 12 weeks to includeNSAID's and/or analgesics, corticosteriod injections, flexibility and strengthening excercises and activity modification. Onset of symptoms was gradual, starting >10 years ago with  gradually worsening course since that time. The patient noted no past surgery on the right knee(s). Patient currently rates pain in the right knee(s) at 8 out of 10 with activity. Patient has night pain, worsening of pain with activity and weight bearing, pain that interferes with activities of daily living, pain with passive range of motion, crepitus and joint swelling. Patient has evidence of periarticular osteophytes and joint space narrowing by imaging studies.There is no active infection.   Laboratory Data: Admission on 02/19/2014, Discharged on 02/21/2014  Component Date Value Ref Range Status  . Prothrombin Time 02/19/2014 15.1  11.6 - 15.2 seconds Final  . INR 02/19/2014 1.22  0.00 - 1.49 Final  . WBC 02/20/2014 9.5  4.0 - 10.5 K/uL Final  . RBC 02/20/2014 3.19* 4.22 - 5.81 MIL/uL Final  . Hemoglobin 02/20/2014 9.2* 13.0 - 17.0 g/dL Final  . HCT 02/20/2014 28.0* 39.0 - 52.0 % Final  . MCV 02/20/2014 87.8  78.0 - 100.0 fL Final  . MCH 02/20/2014 28.8  26.0 - 34.0 pg Final  . MCHC 02/20/2014 32.9  30.0 - 36.0 g/dL Final  . RDW 02/20/2014 13.1  11.5 - 15.5 % Final  . Platelets 02/20/2014 183  150 - 400 K/uL Final  . Sodium 02/20/2014 138  137 - 147 mEq/L Final  . Potassium 02/20/2014 4.2  3.7 - 5.3 mEq/L Final  .  Chloride 02/20/2014 99  96 - 112 mEq/L Final  . CO2 02/20/2014 29  19 - 32 mEq/L Final  . Glucose, Bld 02/20/2014 138* 70 - 99 mg/dL Final  . BUN 02/20/2014 14  6 - 23 mg/dL Final  . Creatinine, Ser 02/20/2014 0.86  0.50 - 1.35 mg/dL Final  . Calcium 02/20/2014 8.6  8.4 - 10.5 mg/dL Final  . GFR calc non Af Amer 02/20/2014 82* >90 mL/min Final  . GFR calc Af Amer 02/20/2014 >90  >90 mL/min Final   Comment: (NOTE)                          The eGFR has been calculated using the CKD EPI equation.                          This calculation has not been validated in all clinical situations.                          eGFR's persistently <90 mL/min signify possible Chronic Kidney                           Disease.  Marland Kitchen Prothrombin Time 02/20/2014 15.8* 11.6 - 15.2 seconds Final  . INR 02/20/2014 1.29  0.00 - 1.49 Final  . WBC 02/21/2014 9.7  4.0 - 10.5 K/uL Final  . RBC 02/21/2014 2.92* 4.22 - 5.81 MIL/uL Final  . Hemoglobin 02/21/2014 8.6* 13.0 - 17.0 g/dL Final  . HCT 02/21/2014 25.7* 39.0 - 52.0 % Final  . MCV 02/21/2014 88.0  78.0 - 100.0 fL Final  . MCH 02/21/2014 29.5  26.0 - 34.0 pg Final  . MCHC 02/21/2014 33.5  30.0 - 36.0 g/dL Final  . RDW 02/21/2014 13.6  11.5 - 15.5 % Final  . Platelets 02/21/2014 159  150 - 400 K/uL Final  . Sodium 02/21/2014 141  137 - 147 mEq/L Final  . Potassium 02/21/2014 3.7  3.7 - 5.3 mEq/L Final  . Chloride 02/21/2014 105  96 - 112 mEq/L Final  . CO2 02/21/2014 29  19 - 32 mEq/L Final  . Glucose, Bld 02/21/2014 120* 70 - 99 mg/dL Final  . BUN 02/21/2014 12  6 - 23 mg/dL Final  . Creatinine, Ser 02/21/2014 0.78  0.50 - 1.35 mg/dL Final  . Calcium 02/21/2014 8.6  8.4 - 10.5 mg/dL Final  . GFR calc non Af Amer 02/21/2014 85* >90 mL/min Final  . GFR calc Af Amer 02/21/2014 >90  >90 mL/min Final   Comment: (NOTE)                          The eGFR has been calculated using the CKD EPI equation.                          This calculation has not been validated in all clinical situations.                          eGFR's persistently <90 mL/min signify possible Chronic Kidney                          Disease.  Marland Kitchen Prothrombin Time 02/21/2014 20.3* 11.6 - 15.2 seconds Final  . INR  02/21/2014 1.79* 0.00 - 1.49 Final  Hospital Outpatient Visit on 02/11/2014  Component Date Value Ref Range Status  . MRSA, PCR 02/11/2014 NEGATIVE  NEGATIVE Final  . Staphylococcus aureus 02/11/2014 NEGATIVE  NEGATIVE Final   Comment:                                 The Xpert SA Assay (FDA                          approved for NASAL specimens                          in patients over 82 years of age),                          is one component of                           a comprehensive surveillance                          program.  Test performance has                          been validated by [employer] for patients greater                          than or equal to 7 year old.                          It is not intended                          to diagnose infection nor to                          guide or monitor treatment.  Marland Kitchen aPTT 02/11/2014 36  24 - 37 seconds Final  . Sodium 02/11/2014 141  137 - 147 mEq/L Final  . Potassium 02/11/2014 4.5  3.7 - 5.3 mEq/L Final  . Chloride 02/11/2014 103  96 - 112 mEq/L Final  . CO2 02/11/2014 29  19 - 32 mEq/L Final  . Glucose, Bld 02/11/2014 81  70 - 99 mg/dL Final  . BUN 02/11/2014 8  6 - 23 mg/dL Final  . Creatinine, Ser 02/11/2014 0.79  0.50 - 1.35 mg/dL Final  . Calcium 02/11/2014 9.8  8.4 - 10.5 mg/dL Final  . Total Protein 02/11/2014 6.6  6.0 - 8.3 g/dL Final  . Albumin 02/11/2014 3.6  3.5 - 5.2 g/dL Final  . AST 02/11/2014 13  0 - 37 U/L Final  . ALT 02/11/2014 8  0 - 53 U/L Final  . Alkaline Phosphatase 02/11/2014 83  39 - 117 U/L Final  . Total Bilirubin 02/11/2014 0.2* 0.3 - 1.2 mg/dL Final  . GFR calc non Af Amer 02/11/2014 85* >90 mL/min Final  . GFR calc Af Amer 02/11/2014 >90  >90 mL/min Final   Comment: (  NOTE)                          The eGFR has been calculated using the CKD EPI equation.                          This calculation has not been validated in all clinical situations.                          eGFR's persistently <90 mL/min signify possible Chronic Kidney                          Disease.  Marland Kitchen Prothrombin Time 02/11/2014 23.5* 11.6 - 15.2 seconds Final  . INR 02/11/2014 2.17* 0.00 - 1.49 Final  . ABO/RH(D) 02/11/2014 O POS   Final  . Antibody Screen 02/11/2014 NEG   Final  . Sample Expiration 02/11/2014 02/22/2014   Final  . Color, Urine 02/11/2014 YELLOW  YELLOW Final  . APPearance 02/11/2014 CLEAR  CLEAR Final  . Specific Gravity, Urine  02/11/2014 1.009  1.005 - 1.030 Final  . pH 02/11/2014 6.5  5.0 - 8.0 Final  . Glucose, UA 02/11/2014 NEGATIVE  NEGATIVE mg/dL Final  . Hgb urine dipstick 02/11/2014 NEGATIVE  NEGATIVE Final  . Bilirubin Urine 02/11/2014 NEGATIVE  NEGATIVE Final  . Ketones, ur 02/11/2014 NEGATIVE  NEGATIVE mg/dL Final  . Protein, ur 02/11/2014 NEGATIVE  NEGATIVE mg/dL Final  . Urobilinogen, UA 02/11/2014 1.0  0.0 - 1.0 mg/dL Final  . Nitrite 02/11/2014 NEGATIVE  NEGATIVE Final  . Leukocytes, UA 02/11/2014 NEGATIVE  NEGATIVE Final   MICROSCOPIC NOT DONE ON URINES WITH NEGATIVE PROTEIN, BLOOD, LEUKOCYTES, NITRITE, OR GLUCOSE <1000 mg/dL.  . WBC 02/11/2014 7.0  4.0 - 10.5 K/uL Final  . RBC 02/11/2014 4.46  4.22 - 5.81 MIL/uL Final  . Hemoglobin 02/11/2014 12.9* 13.0 - 17.0 g/dL Final  . HCT 02/11/2014 39.3  39.0 - 52.0 % Final  . MCV 02/11/2014 88.1  78.0 - 100.0 fL Final  . MCH 02/11/2014 28.9  26.0 - 34.0 pg Final  . MCHC 02/11/2014 32.8  30.0 - 36.0 g/dL Final  . RDW 02/11/2014 13.0  11.5 - 15.5 % Final  . Platelets 02/11/2014 255  150 - 400 K/uL Final  . ABO/RH(D) 02/11/2014 O POS   Final     X-Rays:Dg Knee Right Port  02/19/2014   CLINICAL DATA:  Status post right total knee arthroplasty  EXAM: PORTABLE RIGHT KNEE - 1-2 VIEW  COMPARISON:  09/30/2012  FINDINGS: There are postoperative changes from right knee arthroplasty identified. The hardware components are in anatomic alignment. No evidence for periprosthetic fracture or dislocation. Surgical drain overlies the lateral aspect of the knee. There is gas within the subcutaneous soft tissues.  IMPRESSION: 1. Status post right total knee arthroplasty.   Electronically Signed   By: Kerby Moors M.D.   On: 02/19/2014 11:54    EKG: Orders placed during the hospital encounter of 02/11/14  . EKG 12-LEAD  . EKG 12-LEAD     Hospital Course: ALEXYS GASSETT is a 79 y.o. who was admitted to [facility]. They were brought to the operating room on  02/19/2014 and underwent Procedure(s): RIGHT TOTAL KNEE ARTHROPLASTY.  Patient tolerated the procedure well and was later transferred to the recovery room and then to the orthopaedic floor for postoperative care.  They were given PO and IV analgesics for pain control following their surgery.  They were given 24 hours of postoperative antibiotics of  Anti-infectives   Start     Dose/Rate Route Frequency Ordered Stop   02/19/14 1500  ceFAZolin (ANCEF) IVPB 1 g/50 mL premix     1 g 100 mL/hr over 30 Minutes Intravenous Every 6 hours 02/19/14 1348 02/19/14 2157   02/19/14 0856  polymyxin B 500,000 Units, bacitracin 50,000 Units in sodium chloride irrigation 0.9 % 500 mL irrigation  Status:  Discontinued       As needed 02/19/14 0856 02/19/14 1059   02/19/14 0548  ceFAZolin (ANCEF) IVPB 2 g/50 mL premix     2 g 100 mL/hr over 30 Minutes Intravenous On call to O.R. 02/19/14 [id number] 02/19/14 0818     and started on DVT prophylaxis in the form of Lovenox and Coumadin.   PT and OT were ordered for total joint protocol.  Discharge planning consulted to help with postop disposition and equipment needs.  Patient had a good night on the evening of surgery.  They started to get up OOB with therapy on day one. Hemovac drain was pulled without difficulty.  Continued to work with therapy into day two.  Dressing was changed on day two and the incision was clean and dry. Patient had progressed with therapy and meeting their goals.  Incision was healing well.  Patient was seen in rounds and was ready to go home.   Diet: Cardiac diet Activity:WBAT Follow-up:in 2 weeks Disposition - Home Discharged Condition: stable   Discharge Instructions   Call MD / Call 911    Complete by:  As directed   If you experience chest pain or shortness of breath, CALL 911 and be transported to the hospital emergency room.  If you develope a fever above 101 F, pus (white drainage) or increased drainage or redness at the wound, or calf  pain, call your surgeon's office.     Constipation Prevention    Complete by:  As directed   Drink plenty of fluids.  Prune juice may be helpful.  You may use a stool softener, such as Colace (over the counter) 100 mg twice a day.  Use MiraLax (over the counter) for constipation as needed.     Diet - low sodium heart healthy    Complete by:  As directed      Discharge instructions    Complete by:  As directed   Walk with your walker. Weight bearing as instructed. [place] will follow you at home for your therapy and to manage your Coumadin. Do not change your dressing unless there is excess drainage Shower only, no tub bath. Call if any temperatures greater than 101 or any wound complications: [phone] during the day and ask for Dr. Charlestine Night nurse, Brunilda Payor.     Do not put a pillow under the knee. Place it under the heel.    Complete by:  As directed      Driving restrictions    Complete by:  As directed   No driving     Increase activity slowly as tolerated    Complete by:  As directed             Medication List         albuterol 108 (90 BASE) MCG/ACT inhaler  Commonly known as:  PROVENTIL HFA;VENTOLIN HFA  Inhale 2 puffs into the lungs every 6 (six) hours as needed for wheezing  or shortness of breath.     enoxaparin 40 MG/0.4ML injection  Commonly known as:  LOVENOX  Inject 0.4 mLs (40 mg total) into the skin daily.     ferrous sulfate 325 (65 FE) MG tablet  Take 1 tablet (325 mg total) by mouth 3 (three) times daily after meals.     lisinopril 2.5 MG tablet  Commonly known as:  PRINIVIL,ZESTRIL  Take 2.5 mg by mouth every morning.     methocarbamol 500 MG tablet  Commonly known as:  ROBAXIN  Take 1 tablet (500 mg total) by mouth every 6 (six) hours as needed for muscle spasms.     mometasone-formoterol 200-5 MCG/ACT Aero  Commonly known as:  DULERA  Inhale 2 puffs into the lungs 2 (two) times daily.     omeprazole 20 MG capsule  Commonly known  as:  PRILOSEC  Take 40 mg by mouth daily.     oxyCODONE-acetaminophen 5-325 MG per tablet  Commonly known as:  PERCOCET/ROXICET  Take 1-2 tablets by mouth every 4 (four) hours as needed for severe pain.     roflumilast 500 MCG Tabs tablet  Commonly known as:  DALIRESP  Take 500 mcg by mouth daily.     traZODone 100 MG tablet  Commonly known as:  DESYREL  Take 100 mg by mouth at bedtime as needed for sleep.     warfarin 7.5 MG tablet  Commonly known as:  COUMADIN  Take 7.5 mg by mouth daily. Monday, Tuesday, Thursday and friday     warfarin 5 MG tablet  Commonly known as:  COUMADIN  Take 5 mg by mouth daily. Sunday, Wednesday and Saturday           Follow-up Information   Follow up with GIOFFRE,RONALD A, MD. Schedule an appointment as soon as possible for a visit in 2 weeks.   Specialty:  Orthopedic Surgery   Contact information:   [address] [place] [address] [phone]       Signed: Ardeen Jourdain, PA-C Orthopaedic Surgery 02/21/2014, 10:29 AM

## 2014-02-21 NOTE — Progress Notes (Signed)
Physical Therapy Treatment Patient Details Name: [name] MRN: [id number] DOB: 17-May-1935 Today's Date: 02/21/2014    History of Present Illness Pt is s/p RIGHT TOTAL KNEE ARTHROPLASTY with PMH of anxiety, COPD, afib.    PT Comments    Pt limited this am by increased discomfort but continues motivated, cooperative and eager for d/c  Follow Up Recommendations  Home health PT     Equipment Recommendations  None recommended by PT    Recommendations for Other Services OT consult     Precautions / Restrictions Precautions Precautions: Knee;Fall Required Braces or Orthoses: Knee Immobilizer - Right Knee Immobilizer - Right: Discontinue once straight leg raise with < 10 degree lag Restrictions Weight Bearing Restrictions: No Other Position/Activity Restrictions: WBAT    Mobility  Bed Mobility Overal bed mobility: Needs Assistance Bed Mobility: Supine to Sit;Sit to Supine     Supine to sit: Min assist Sit to supine: Min assist   General bed mobility comments: cues for sequence with min assist to manage L LE   Transfers Overall transfer level: Needs assistance Equipment used: Rolling walker (2 wheeled)   Sit to Stand: Min guard         General transfer comment: verbal cues for hand placement and LE management.   Ambulation/Gait Ambulation/Gait assistance: Min guard Ambulation Distance (Feet): 130 Feet Assistive device: Rolling walker (2 wheeled) Gait Pattern/deviations: Step-to pattern;Shuffle;Antalgic;Decreased step length - right;Decreased step length - left     General Gait Details: cues for posture, position from RW and sequence   Stairs Stairs: Yes Stairs assistance: Min assist Stair Management: No rails;Step to pattern;Backwards;Forwards;With walker Number of Stairs: 1 General stair comments: up/down single steps twice fwd and once bkwd  Wheelchair Mobility    Modified Rankin (Stroke Patients Only)       Balance                                    Cognition Arousal/Alertness: Awake/alert Behavior During Therapy: WFL for tasks assessed/performed Overall Cognitive Status: Within Functional Limits for tasks assessed                      Exercises Total Joint Exercises Ankle Circles/Pumps: AROM;Both;10 reps;Supine Quad Sets: AROM;10 reps;Both;Supine Heel Slides: AAROM;Supine;Left;20 reps Straight Leg Raises: AAROM;AROM;Right;Supine;20 reps Goniometric ROM: AAROM -10 - 50    General Comments        Pertinent Vitals/Pain 6/10; premed, ice pack provided    Home Living                      Prior Function            PT Goals (current goals can now be found in the care plan section) Acute Rehab PT Goals Patient Stated Goal: return home PT Goal Formulation: With patient Time For Goal Achievement: 02/26/14 Potential to Achieve Goals: Good Progress towards PT goals: Progressing toward goals    Frequency  7X/week    PT Plan Current plan remains appropriate    Co-evaluation             End of Session Equipment Utilized During Treatment: Gait belt;Right knee immobilizer Activity Tolerance: Patient tolerated treatment well Patient left: in bed;with call bell/phone within reach;with family/visitor present     Time: 0813-0901 PT Time Calculation (min): 48 min  Charges:  G Codes:      Cherrise Occhipinti 02-26-14, 12:47 PM

## 2014-02-21 NOTE — Progress Notes (Signed)
Subjective: 2 Days Post-Op Procedure(s) (LRB): RIGHT TOTAL KNEE ARTHROPLASTY (Right) Patient reports pain as 2 on 0-10 scale.Doing well. He was getting out of bed on his own and slipped to the floor. He denies any pain or injuries from the incident. No positive findings on exam. Will DC.    Objective: Vital signs in last 24 hours: Temp:  [98 F (36.7 C)-98.8 F (37.1 C)] 98.7 F (37.1 C) (06/12 0557) Pulse Rate:  [76-98] 98 (06/12 0557) Resp:  [16-17] 16 (06/12 0557) BP: (101-134)/(47-66) 134/66 mmHg (06/12 0557) SpO2:  [94 %-98 %] 94 % (06/12 0557)  Intake/Output from previous day: 06/11 0701 - 06/12 0700 In: 1100 [P.O.:720; I.V.:380] Out: 1725 [Urine:1725] Intake/Output this shift:     Recent Labs  02/20/14 0415 02/21/14 0406  HGB 9.2* 8.6*    Recent Labs  02/20/14 0415 02/21/14 0406  WBC 9.5 9.7  RBC 3.19* 2.92*  HCT 28.0* 25.7*  PLT 183 159    Recent Labs  02/20/14 0415 02/21/14 0406  NA 138 141  K 4.2 3.7  CL 99 105  CO2 29 29  BUN 14 12  CREATININE 0.86 0.78  GLUCOSE 138* 120*  CALCIUM 8.6 8.6    Recent Labs  02/20/14 0415 02/21/14 0406  INR 1.29 1.79*    Neurologically intact  Assessment/Plan: 2 Days Post-Op Procedure(s) (LRB): RIGHT TOTAL KNEE ARTHROPLASTY (Right) Discharge home with home health  [name] A 02/21/2014, 7:29 AM

## 2014-03-18 ENCOUNTER — Telehealth: Payer: Self-pay | Admitting: Internal Medicine

## 2014-03-18 NOTE — Telephone Encounter (Addendum)
Pt's wife states that pt is considering switching to a NOAC as they are having issues with Coumadin levels/regulation. I gave them NOAC names to Jovani Colquhoun to determine if they still want to switch. Recommended to check on prices, consult with doctor following Coumadin, and call us back if decision is to switch so that it can be reviewed with Dr. Caryl Comes. They are currently seeing Meagan Cephus Richer at [facility] at [phone]. Patient's wife verbalized understanding and agreeable to plan.

## 2014-03-18 NOTE — Telephone Encounter (Signed)
New message      Talk to a nurse about medication changes

## 2014-07-17 NOTE — Telephone Encounter (Signed)
Received request from Nurse fax box, documents faxed for surgical clearance. To: [employer] Fax number: [phone] Attention: 4.7.15/S. March Rummage

## 2014-11-27 ENCOUNTER — Ambulatory Visit (INDEPENDENT_AMBULATORY_CARE_PROVIDER_SITE_OTHER): Payer: Medicare HMO

## 2014-11-27 ENCOUNTER — Other Ambulatory Visit: Payer: Self-pay | Admitting: Orthopedic Surgery

## 2014-11-27 DIAGNOSIS — Z9889 Other specified postprocedural states: Secondary | ICD-10-CM

## 2014-11-27 DIAGNOSIS — M25511 Pain in right shoulder: Secondary | ICD-10-CM | POA: Diagnosis not present

## 2014-11-27 DIAGNOSIS — R52 Pain, unspecified: Secondary | ICD-10-CM

## 2015-01-01 ENCOUNTER — Other Ambulatory Visit: Payer: Self-pay | Admitting: Surgical

## 2015-01-13 ENCOUNTER — Telehealth: Payer: Self-pay | Admitting: Internal Medicine

## 2015-01-13 DIAGNOSIS — I480 Paroxysmal atrial fibrillation: Secondary | ICD-10-CM

## 2015-01-13 NOTE — Telephone Encounter (Signed)
New message   Office calling  Request for surgical clearance:  1. What type of surgery is being performed? Total knee replacement   2. When is this surgery scheduled? 5.18.2016   3. Are there any medications that need to be held prior to surgery and how long? Coumadin off 5 day prior. Does pt need to be bridge with lovenox, if so will Dr. Caryl Comes be taken care of this   4. Name of physician performing surgery? Dr. Gladstone Lighter  5. What is your office phone and fax number? [phone] / fax [phone]

## 2015-01-13 NOTE — Telephone Encounter (Signed)
Routing to Dr. Caryl Comes to address

## 2015-01-15 NOTE — Telephone Encounter (Signed)
Cardiac learance would be ok if we can get a new echo He will also need pulmonary clearance as he has had O2 dependent COPD

## 2015-01-15 NOTE — Telephone Encounter (Signed)
Follow Up  Dr. Rushie Nyhan following up on previous message. Please call back and discuss.

## 2015-01-15 NOTE — Telephone Encounter (Signed)
Updated fax number to fax clearance to [phone]

## 2015-01-16 NOTE — Telephone Encounter (Signed)
Informed [name] at [employer] office of Dr. Olin Pia instructions.  Explained that I would call her back once echo scheduled to inform her of plan. I will also discuss stopping Coumadin with our pharmacist.

## 2015-01-16 NOTE — Telephone Encounter (Addendum)
Informed Gioffre's office of echo scheduled for 5/9 and office visit for clearance with Chanetta Marshall, NP on 5/16. Will fax [facility] request for advisement on Coumadin/Lovenox bridging - will ask they contact [place] office with instructions for patient.

## 2015-01-16 NOTE — Addendum Note (Signed)
Addended by: Stanton Kidney on: 01/16/2015 09:27 AM   Modules accepted: Orders

## 2015-01-19 ENCOUNTER — Other Ambulatory Visit: Payer: Self-pay

## 2015-01-19 ENCOUNTER — Ambulatory Visit (HOSPITAL_COMMUNITY): Payer: Medicare HMO | Attending: Internal Medicine

## 2015-01-19 DIAGNOSIS — E785 Hyperlipidemia, unspecified: Secondary | ICD-10-CM | POA: Insufficient documentation

## 2015-01-19 DIAGNOSIS — I2510 Atherosclerotic heart disease of native coronary artery without angina pectoris: Secondary | ICD-10-CM | POA: Diagnosis not present

## 2015-01-19 DIAGNOSIS — I440 Atrioventricular block, first degree: Secondary | ICD-10-CM | POA: Insufficient documentation

## 2015-01-19 DIAGNOSIS — I10 Essential (primary) hypertension: Secondary | ICD-10-CM | POA: Diagnosis not present

## 2015-01-19 DIAGNOSIS — J449 Chronic obstructive pulmonary disease, unspecified: Secondary | ICD-10-CM | POA: Insufficient documentation

## 2015-01-19 DIAGNOSIS — I480 Paroxysmal atrial fibrillation: Secondary | ICD-10-CM

## 2015-01-19 DIAGNOSIS — I4891 Unspecified atrial fibrillation: Secondary | ICD-10-CM | POA: Insufficient documentation

## 2015-01-21 ENCOUNTER — Encounter (HOSPITAL_COMMUNITY): Admission: RE | Admit: 2015-01-21 | Payer: Medicare HMO | Source: Ambulatory Visit

## 2015-01-23 ENCOUNTER — Ambulatory Visit (INDEPENDENT_AMBULATORY_CARE_PROVIDER_SITE_OTHER): Payer: Medicare HMO | Admitting: Pulmonary Disease

## 2015-01-23 ENCOUNTER — Encounter: Payer: Self-pay | Admitting: Pulmonary Disease

## 2015-01-23 VITALS — BP 115/60 | HR 78 | Temp 97.2°F | Ht 71.0 in | Wt 185.0 lb

## 2015-01-23 DIAGNOSIS — J438 Other emphysema: Secondary | ICD-10-CM | POA: Diagnosis not present

## 2015-01-23 DIAGNOSIS — J441 Chronic obstructive pulmonary disease with (acute) exacerbation: Secondary | ICD-10-CM | POA: Diagnosis not present

## 2015-01-23 MED ORDER — VALSARTAN 160 MG PO TABS: ORAL_TABLET | ORAL | Status: DC

## 2015-01-23 MED ORDER — TIOTROPIUM BROMIDE MONOHYDRATE 2.5 MCG/ACT IN AERS: 2.0000 | INHALATION_SPRAY | Freq: Every day | RESPIRATORY_TRACT | Status: AC

## 2015-01-23 NOTE — Assessment & Plan Note (Signed)
The patient has had a fairly significant decline in his flows since his last PFTs in 2013. Despite this, he has remained fairly functional, and has not had a recent acute exacerbation. I think he is okay to have his upcoming total knee replacement, but he is at moderate risk for postop pulmonary complications such as pneumonia or other respiratory issues. I do think he needs to discontinue his daliresp as a trial, since he does not have end-stage disease nor recurrent acute exacerbations. He is not on an anticholinergic, and I would like to try him on Spiriva with his dulera. Finally, he is having a lot of upper airway cough with a feeling of choking and throat clearing, and I notice that he is on an ACE inhibitor. I would like to try him off of this for a period of time, and have given him a substitute prescription until he can get to his primary care doctor.

## 2015-01-23 NOTE — Patient Instructions (Addendum)
Stay on dulera twice a day everyday  Would hold daliresp as a trial to see if you really need it. Try spiriva respimat, 2 inhalations each am everyday  You need to come off lisinopril because of your cough.  Will send in 4 weeks worth of diovan 160mg , and take 1/2 tab each am until you can get with your primary doctor and get another substitute. You are ok to have surgery, but do have moderate risk for pulmonary issues after the surgery.  I think this risk can be managed.  followup in 3-4 weeks with me to check on how things are going.

## 2015-01-23 NOTE — Progress Notes (Signed)
   Subjective:    Patient ID: [name], male    DOB: Jul 21, 1935, 80 y.o.   MRN: [id number]  HPI The patient is a 80 year old male who I've been asked to see for preoperative pulmonary consultation. He was last seen in our office in 2013, and carries the diagnosis of moderate COPD. He has a long history of smoking, but has not done so since 1997. He has continued on dulera high-dose, as well as Daliresp once a day. He has been tried on Spiriva in the distant past, and did not think it helped. He is not sure the daliresp has helped either.  He is scheduled for upcoming left total knee replacement surgery, and needs pulmonary clearance. The patient feels that his dyspnea on exertion is at his usual baseline, and denies having a recent acute exacerbation. He gives a 2 block history of dyspnea on exertion at a moderate pace on flat ground, but denies any significant dyspnea with one flight of stairs. He also is able to take the trash cans to the street without difficulty. He does have a significant cough that occurs on a daily basis, and he will produce nonpurulent mucus at times. He describes frequent episodes of acute bronchitis. He also tells me that he responds very well to prednisone. It should be noted that he has a significant choking sensation and fullness in his throat, and is clearing his throat during our visit today. He is on an ACE inhibitor. He has had a recent chest x-ray a week ago that showed no acute process. He did have a 5 mm left mid lung nodule, but review of his old chest x-rays does show a small partially calcified density in the left mid lung zone which has been stable.     Review of Systems  Constitutional: Negative for fever and unexpected weight change.  HENT: Negative for congestion, dental problem, ear pain, nosebleeds, postnasal drip, rhinorrhea, sinus pressure, sneezing, sore throat and trouble swallowing.   Eyes: Negative for redness and itching.  Respiratory: Negative  for cough, chest tightness, shortness of breath and wheezing.   Cardiovascular: Negative for palpitations and leg swelling.  Gastrointestinal: Negative for nausea and vomiting.  Genitourinary: Negative for dysuria.  Musculoskeletal: Negative for joint swelling.  Skin: Negative for rash.  Neurological: Negative for headaches.  Hematological: Does not bruise/bleed easily.  Psychiatric/Behavioral: Negative for dysphoric mood. The patient is not nervous/anxious.        Objective:   Physical Exam Constitutional:  Well developed, no acute distress  HENT:  Nares patent without discharge  Oropharynx without exudate, palate normal, uvula elongated.  Eyes:  Perrla, eomi, no scleral icterus  Neck:  No JVD, no TMG  Cardiovascular:  Normal rate, regular rhythm, no rubs or gallops.  No murmurs        Intact distal pulses  Pulmonary :  Decreased breath sounds, no stridor or respiratory distress   No rales, rhonchi, or wheezing  Abdominal:  Soft, nondistended, bowel sounds present.  No tenderness noted.   Musculoskeletal:  No lower extremity edema noted.  Lymph Nodes:  No cervical lymphadenopathy noted  Skin:  No cyanosis noted  Neurologic:  Alert, appropriate, moves all 4 extremities without obvious deficit.         Assessment & Plan:

## 2015-01-24 ENCOUNTER — Encounter: Payer: Self-pay | Admitting: Nurse Practitioner

## 2015-01-24 NOTE — Progress Notes (Signed)
Electrophysiology Office Note Date: 01/26/2015  ID:  [name], DOB Sep 11, 1935, MRN [id number]  PCP: Redge Gainer, MD Electrophysiologist: Caryl Comes  CC: atrial fibrillation follow up and surgical clearance  [name] Patient is [name] 80 y.o. male is seen today for Dr Caryl Comes.  He presents today for routine electrophysiology followup and surgical clearance.  He has [name] history of atrial fibrillation requiring cardioversion in 2009.  He has maintained SR since and is chronically anticoagulated with Warfarin.  He also has COPD and was previously oxygen dependent. Since last being seen in our clinic, the patient reports doing very well.  He feels that he is maintaining SR.  He denies chest pain, palpitations, dyspnea, PND, orthopnea, nausea, vomiting, dizziness, syncope.  He is functionally limited by knee pain.   Echocardiogram 01/2015 demonstrated EF 55-60%, no clear RWMA, LA 32.   Past Medical History  Diagnosis Date  . Paroxysmal atrial fibrillation     coumadin therapy;  echo 1/10: EF 65-70%, mild diast dysfxn  . Hyperlipidemia   . GERD (gastroesophageal reflux disease)   . Allergic rhinitis   . Asthma   . Anxiety   . CAD (coronary artery disease)     cath 7/09: pCFX 25%, mRCA 25%, EF 60%;  Myoview 6/11: low risk, EF 67%  . Sebaceous cyst     scalp  . SVT (supraventricular tachycardia)     s/p RFCA 05/2010  . First degree AV block   . History of TIAs   . Hiatal hernia   . COPD (chronic obstructive pulmonary disease)     admitted 5/11, uses oxygen 2 liters/min prn rare use  . Tubular adenoma 2014    Dr. Hilarie Fredrickson  . Hypertension    Past Surgical History  Procedure Laterality Date  . Cystectomy  2012    removed from head  . Colonoscopy N/[name] 02/07/2013    Procedure: COLONOSCOPY;  Surgeon: Jerene Bears, MD;  Location: [facility];  Service: Gastroenterology;  Laterality: N/[name];  . Esophagogastroduodenoscopy N/[name] 02/07/2013    Procedure: ESOPHAGOGASTRODUODENOSCOPY (EGD);  Surgeon:  Jerene Bears, MD;  Location: [facility];  Service: Gastroenterology;  Laterality: N/[name];  . Back surgery  1980    lower L 4 to L 5  . Cardiac electrophysiology mapping and ablation  2008    Dr Caryl Comes - details not avaialble in [facility]  . Inguinal hernia repair Left 04/15/2013    Procedure: LEFT HERNIA REPAIR INGUINAL INCARCERATED;  Surgeon: Gayland Curry, MD;  Location: [facility];  Service: General;  Laterality: Left;  . Insertion of mesh Left 04/15/2013    Procedure: INSERTION OF MESH;  Surgeon: Gayland Curry, MD;  Location: [facility];  Service: General;  Laterality: Left;  . Total knee arthroplasty Right 02/19/2014    Procedure: RIGHT TOTAL KNEE ARTHROPLASTY;  Surgeon: Tobi Bastos, MD;  Location: [facility];  Service: Orthopedics;  Laterality: Right;  . Cardioversion  2009    Dr Percival Spanish    Current Outpatient Prescriptions  Medication Sig Dispense Refill  . albuterol (PROVENTIL HFA;VENTOLIN HFA) 108 (90 BASE) MCG/ACT inhaler Inhale 2 puffs into the lungs every 6 (six) hours as needed for wheezing or shortness of breath.    . calcium-vitamin D (OSCAL WITH D) 500-200 MG-UNIT per tablet Take 1 tablet by mouth.    Marland Kitchen ipratropium-albuterol (DUONEB) 0.5-2.5 (3) MG/3ML SOLN Take 3 mLs by nebulization every 4 (four) hours as needed (FOR WHEEZING).    . magnesium oxide (MAG-OX) 400 MG tablet  Take 400 mg by mouth daily.    . Multiple Vitamin (MULTIVITAMIN WITH MINERALS) TABS tablet Take 1 tablet by mouth daily.    . multivitamin-iron-minerals-folic acid (CENTRUM) chewable tablet Chew 1 tablet by mouth daily.    . vitamin B-12 (CYANOCOBALAMIN) 1000 MCG tablet Take 1,000 mcg by mouth daily.    Marland Kitchen warfarin (COUMADIN) 1 MG tablet Take 3-4 mg by mouth daily.    . [DISCONTINUED] atorvastatin (LIPITOR) 20 MG tablet Take 20 mg by mouth daily.      . [DISCONTINUED] clonazePAM (KLONOPIN) 0.5 MG tablet Take 0.5 mg by mouth at bedtime as needed. For sleep     . [DISCONTINUED] furosemide (LASIX) 20 MG tablet Take 20 mg by  mouth daily.       No current facility-administered medications for this visit.    Allergies:   Review of patient's allergies indicates no known allergies.   Social History: History   Social History  . Marital Status: Married    Spouse Name: N/[name]  . Number of Children: 5  . Years of Education: N/[name]   Occupational History  . retired    Social History Main Topics  . Smoking status: Former Smoker -- 3.00 packs/day for 40 years    Types: Cigarettes    Quit date: 09/13/1995  . Smokeless tobacco: Never Used  . Alcohol Use: No  . Drug Use: No  . Sexual Activity: Not on file   Other Topics Concern  . Not on file   Social History Narrative    Family History: Family History  Problem Relation Age of Onset  . Hypertension Mother   . COPD Father   . COPD Sister   . Cancer Brother     lung  . Lung cancer Mother     Review of Systems: All other systems reviewed and are otherwise negative except as noted above.   Physical Exam: VS:  BP 120/70 mmHg  Pulse 73  Ht 5' 11.5" (1.816 m)  Wt 182 lb 12.8 oz (82.918 kg)  BMI 25.14 kg/m2 , BMI Body mass index is 25.14 kg/(m^2). Wt Readings from Last 3 Encounters:  01/26/15 182 lb 12.8 oz (82.918 kg)  01/23/15 185 lb (83.915 kg)  02/19/14 168 lb 4 oz (76.318 kg)    GEN- The patient is elderly appearing, alert and oriented x 3 today.   HEENT: normocephalic, atraumatic; sclera clear, conjunctiva pink; hearing intact; oropharynx clear; neck supple, no JVP Lymph- no cervical lymphadenopathy Lungs- Decreased breath sounds but clear to ausculation bilaterally, normal work of breathing.  No wheezes, rales, rhonchi Heart- Regular rate and rhythm, no murmurs, rubs or gallops  GI- soft, non-tender, non-distended, bowel sounds present  Extremities- no clubbing, cyanosis, or edema; DP/PT/radial pulses 2+ bilaterally MS- no significant deformity or atrophy Skin- warm and dry, no rash or lesion  Psych- euthymic mood, full affect Neuro-  strength and sensation are intact   EKG:  EKG is ordered today. The ekg ordered today shows sinus rhythm, rate 73, normal intervals  Recent Labs: 02/11/2014: ALT 8 02/21/2014: BUN 12; Creatinine 0.78; Hemoglobin 8.6*; Platelets 159; Potassium 3.7; Sodium 141    Other studies Reviewed: Additional studies/ records that were reviewed today include: Dr Olin Pia office notes, recent echo  Assessment and Plan: 1.  Paroxysmal atrial fibrillation Maintaining SR by symptoms and EKG today Continue Warfarin for CHADS2VASC score of at least 5 Recent echo with normal LVEF and LA of 32  2.  HTN Stable No change required today  3.  Surgical clearance The patient is at acceptable cardiac risk for orthopedic surgery.   With prior TIA, would recommend Lovenox bridge for Coumadin.  Seen by Dr Gwenette Greet 5/13 for pulmonary clearance   Current medicines are reviewed at length with the patient today.   The patient does not have concerns regarding his medicines.  The following changes were made today:  none  Labs/ tests ordered today include: none    Disposition:   Follow up with Dr Caryl Comes in 1 year   Signed, Chanetta Marshall, NP 01/26/2015 9:18 AM   [facility] [address] [place] [address] [phone] (office) [phone] (fax)

## 2015-01-26 ENCOUNTER — Encounter: Payer: Self-pay | Admitting: Nurse Practitioner

## 2015-01-26 ENCOUNTER — Ambulatory Visit (INDEPENDENT_AMBULATORY_CARE_PROVIDER_SITE_OTHER): Payer: Medicare HMO | Admitting: Nurse Practitioner

## 2015-01-26 VITALS — BP 120/70 | HR 73 | Ht 71.5 in | Wt 182.8 lb

## 2015-01-26 DIAGNOSIS — I10 Essential (primary) hypertension: Secondary | ICD-10-CM

## 2015-01-26 DIAGNOSIS — I480 Paroxysmal atrial fibrillation: Secondary | ICD-10-CM

## 2015-01-26 DIAGNOSIS — Z01810 Encounter for preprocedural cardiovascular examination: Secondary | ICD-10-CM

## 2015-01-26 NOTE — Patient Instructions (Signed)
Medication Instructions:  Your physician recommends that you continue on your current medications as directed. Please refer to the Current Medication list given to you today.   Labwork: None   Testing/Procedures: None   Follow-Up: Your physician wants you to follow-up in: 1 year with Dr.Klein. You will receive a reminder letter in the mail two months in advance. If you don't receive a letter, please call our office to schedule the follow-up appointment.   Any Other Special Instructions Will Be Listed Below (If Applicable). You are cleared from a cardiac standpoint for your upcoming surgery.  You will need bridging of your Coumadin with Lovenox. Please contact your primary care physician who manages your coumadin  You will need clearance for Pulmonology prior to your surgery

## 2015-01-28 ENCOUNTER — Encounter (HOSPITAL_COMMUNITY): Admission: RE | Payer: Self-pay | Source: Ambulatory Visit

## 2015-01-28 ENCOUNTER — Inpatient Hospital Stay (HOSPITAL_COMMUNITY): Admission: RE | Admit: 2015-01-28 | Payer: Medicare HMO | Source: Ambulatory Visit | Admitting: Orthopedic Surgery

## 2015-01-28 SURGERY — ARTHROPLASTY, KNEE, TOTAL
Anesthesia: General | Laterality: Left

## 2015-01-28 NOTE — Telephone Encounter (Signed)
Faxed clearance to [facility] - patient cleared for surgery, will need Lovenox bridging.

## 2015-02-04 ENCOUNTER — Telehealth: Payer: Self-pay | Admitting: Pulmonary Disease

## 2015-02-04 NOTE — Telephone Encounter (Signed)
Spoke with [name] at [facility]. She requests that we just send the OV note. This will be faxed.

## 2015-02-04 NOTE — Telephone Encounter (Signed)
Pt is needing a letter for surgical clearance.  [place] - please advise. Thanks.

## 2015-02-04 NOTE — Telephone Encounter (Signed)
Already addressed. See my last consult note.

## 2015-02-10 ENCOUNTER — Encounter: Payer: Self-pay | Admitting: Pulmonary Disease

## 2015-02-10 ENCOUNTER — Ambulatory Visit (INDEPENDENT_AMBULATORY_CARE_PROVIDER_SITE_OTHER): Payer: Medicare HMO | Admitting: Pulmonary Disease

## 2015-02-10 ENCOUNTER — Telehealth: Payer: Self-pay | Admitting: Pulmonary Disease

## 2015-02-10 VITALS — BP 140/70 | HR 80 | Temp 97.0°F | Ht 71.5 in | Wt 190.0 lb

## 2015-02-10 DIAGNOSIS — J438 Other emphysema: Secondary | ICD-10-CM | POA: Diagnosis not present

## 2015-02-10 MED ORDER — TIOTROPIUM BROMIDE MONOHYDRATE 2.5 MCG/ACT IN AERS: 2.0000 | INHALATION_SPRAY | Freq: Every day | RESPIRATORY_TRACT | Status: DC

## 2015-02-10 MED ORDER — MOMETASONE FURO-FORMOTEROL FUM 200-5 MCG/ACT IN AERO: 2.0000 | INHALATION_SPRAY | Freq: Two times a day (BID) | RESPIRATORY_TRACT | Status: DC

## 2015-02-10 NOTE — Patient Instructions (Signed)
Stay on spiriva and dulera for your breathing.  Will send in a prescription to your pharmacy plan with refills. You need to call your primary doctor and get a substitute for your lisinopril. Can finish up the medication that I called in for you. followup with Dr. Lake Bells in 50mos.

## 2015-02-10 NOTE — Progress Notes (Signed)
   Subjective:    Patient ID: [name], male    DOB: [birth year], 80 y.o.   MRN: [id number]  HPI The patient comes in today for follow-up of his known COPD. Spiriva was added to his dulera at the last visit, and he saw significant improvement in his breathing. He also was having a lot of dry cough, and his lisinopril was changed transiently to Diovan. He was supposed to call his primary physician for a substitute, but did not. He tells me that his cough is at least 50% better since being off the medication.   Review of Systems  Constitutional: Negative for fever and unexpected weight change.  HENT: Positive for congestion and postnasal drip. Negative for dental problem, ear pain, nosebleeds, rhinorrhea, sinus pressure, sneezing, sore throat and trouble swallowing.   Eyes: Negative for redness and itching.  Respiratory: Positive for cough and shortness of breath. Negative for chest tightness and wheezing.   Cardiovascular: Negative for palpitations and leg swelling.  Gastrointestinal: Negative for nausea and vomiting.  Genitourinary: Negative for dysuria.  Musculoskeletal: Negative for joint swelling.  Skin: Negative for rash.  Neurological: Negative for headaches.  Hematological: Does not bruise/bleed easily.  Psychiatric/Behavioral: Negative for dysphoric mood. The patient is not nervous/anxious.        Objective:   Physical Exam Well-developed male in no acute distress Nose without purulence or discharge noted  Neck without lymphadenopathy or thyromegaly Chest with mildly decreased breath sounds, no wheezing Cardiac exam with regular rate and rhythm Lower extremities without significant edema, no cyanosis Alert and oriented, moves all 4 extremities.       Assessment & Plan:

## 2015-02-10 NOTE — Telephone Encounter (Signed)
RX sent in for dulera. Nothing further needed

## 2015-02-10 NOTE — Assessment & Plan Note (Addendum)
The patient feels the addition of Spiriva to his regimen has significantly improved his breathing. He would like to stay on this, and I've also reminded him of the importance of regular activity.  His cough is at least 50% better since being off lisinopril, and I've asked him to call his primary care physician to get a substitute. He can finish up his Diovan in the interim.

## 2015-02-13 ENCOUNTER — Ambulatory Visit: Payer: Medicare HMO | Admitting: Pulmonary Disease

## 2015-02-20 ENCOUNTER — Other Ambulatory Visit: Payer: Self-pay | Admitting: Surgical

## 2015-02-26 ENCOUNTER — Telehealth: Payer: Self-pay | Admitting: Internal Medicine

## 2015-02-26 NOTE — Telephone Encounter (Signed)
New message      Request for surgical clearance:  What type of surgery is being performed? Knee surgery When is this surgery scheduled? 03-11-15 1. Are there any medications that need to be held prior to surgery and how long? Need to know when to stop coumadin prior to surgery so PCP can begin lovenox injection  2. Name of physician performing surgery?  Dr Pervis Hocking but PCP is handling lovenox injection  3. What is your office phone and fax number?  [phone]----------Lynn is leaving now----please call friday

## 2015-02-27 NOTE — Telephone Encounter (Signed)
Informed to stop Coumadin 5 days prior to knee surgery.   They will arrange Lovenox bridging for patient.

## 2015-02-27 NOTE — Telephone Encounter (Signed)
Would like Dr. Caryl Comes to advise on when to stop Coumadin prior to knee surgery. She is aware I will address this with him this afternoon and call her back before 2pm with recommendations.

## 2015-03-02 ENCOUNTER — Telehealth: Payer: Self-pay | Admitting: Internal Medicine

## 2015-03-02 ENCOUNTER — Encounter (HOSPITAL_COMMUNITY): Payer: Self-pay

## 2015-03-02 NOTE — Telephone Encounter (Signed)
Follow Up       Pt's wife calling to clarify when pt is suppose to stop coumadin prior to surgery. Please call back and advise.

## 2015-03-02 NOTE — Telephone Encounter (Signed)
Confirmed with patient ok to speak with wife.  Informed them I faxed information last week that ok for patient to stop Coumadin 5 days prior to surgery.  Also discussed that he would need Lovenox bridging and his PCP is supposed to be arranging this.  Advised them to call PCP about this. Patient's wife verbalized understanding and agreeable to plan.

## 2015-03-02 NOTE — Patient Instructions (Addendum)
[name]  03/02/2015   Your procedure is scheduled on: 03/11/2015    Report to [facility] Main  Entrance and follow signs to [facility] .  Take the [place] Elevators to the 3rd Floor and you will be at [facility] where [place] is located.                  Call this number if you have problems the morning of surgery [phone]   Remember: ONLY 1 PERSON MAY GO WITH YOU TO SHORT STAY TO GET  READY MORNING OF YOUR SURGERY.  Do not eat food or drink liquids :After Midnight.     Take these medicines the morning of surgery with A SIP OF WATER:   Albuterol Inhaler if needed and bring,   Duoneb if needed, Dulera and bring, [employer] may not have any metal on your body including hair pins and              piercings  Do not wear jewelry,  lotions, powders or perfumes, deodorant                     Men may shave face and neck.   Do not bring valuables to the hospital. [place].  Contacts, dentures or bridgework may not be worn into surgery.  Leave suitcase in the car. After surgery it may be brought to your room.         Special Instructions: coughing and deep breathing exercises , leg exercises               Please read over the following fact sheets you were given: _____________________________________________________________________             [facility] - Preparing for Surgery Before surgery, you can play an important role.  Because skin is not sterile, your skin needs to be as free of germs as possible.  You can reduce the number of germs on your skin by washing with CHG (chlorahexidine gluconate) soap before surgery.  CHG is an antiseptic cleaner which kills germs and bonds with the skin to continue killing germs even after washing. Please DO NOT use if you have an allergy to CHG or antibacterial soaps.  If your skin becomes reddened/irritated stop using the  CHG and inform your nurse when you arrive at Short Stay. Do not shave (including legs and underarms) for at least 48 hours prior to the first CHG shower.  You may shave your face/neck. Please follow these instructions carefully:  1.  Shower with CHG Soap the night before surgery and the  morning of Surgery.  2.  If you choose to wash your hair, wash your hair first as usual with your  normal  shampoo.  3.  After you shampoo, rinse your hair and body thoroughly to remove the  shampoo.                           4.  Use CHG as you would any other liquid soap.  You can apply chg directly  to the skin and wash                       Gently with a scrungie or clean washcloth.  5.  Apply the CHG Soap to your body ONLY FROM THE NECK DOWN.   Do not use on face/ open                           Wound or open sores. Avoid contact with eyes, ears mouth and genitals (private parts).                       Wash face,  Genitals (private parts) with your normal soap.             6.  Wash thoroughly, paying special attention to the area where your surgery  will be performed.  7.  Thoroughly rinse your body with warm water from the neck down.  8.  DO NOT shower/wash with your normal soap after using and rinsing off  the CHG Soap.                9.  Pat yourself dry with a clean towel.            10.  Wear clean pajamas.            11.  Place clean sheets on your bed the night of your first shower and do not  sleep with pets. Day of Surgery : Do not apply any lotions/deodorants the morning of surgery.  Please wear clean clothes to the hospital/surgery center.  FAILURE TO FOLLOW THESE INSTRUCTIONS MAY RESULT IN THE CANCELLATION OF YOUR SURGERY PATIENT SIGNATURE_________________________________  NURSE SIGNATURE__________________________________  ________________________________________________________________________  WHAT IS A BLOOD TRANSFUSION? Blood Transfusion Information  A transfusion is the replacement of  blood or some of its parts. Blood is made up of multiple cells which provide different functions.  Red blood cells carry oxygen and are used for blood loss replacement.  White blood cells fight against infection.  Platelets control bleeding.  Plasma helps clot blood.  Other blood products are available for specialized needs, such as hemophilia or other clotting disorders. BEFORE THE TRANSFUSION  Who gives blood for transfusions?   Healthy volunteers who are fully evaluated to make sure their blood is safe. This is blood bank blood. Transfusion therapy is the safest it has ever been in the practice of medicine. Before blood is taken from a donor, a complete history is taken to make sure that person has no history of diseases nor engages in risky social behavior (examples are intravenous drug use or sexual activity with multiple partners). The donor's travel history is screened to minimize risk of transmitting infections, such as malaria. The donated blood is tested for signs of infectious diseases, such as HIV and hepatitis. The blood is then tested to be sure it is compatible with you in order to minimize the chance of a transfusion reaction. If you or a relative donates blood, this is often done in anticipation of surgery and is not appropriate for emergency situations. It takes many days to process the donated blood. RISKS AND COMPLICATIONS Although transfusion therapy is very safe and saves many lives, the main dangers of transfusion include:  1. Getting an infectious disease. 2. Developing a transfusion reaction. This is an allergic reaction to something in the blood you were given. Every precaution is taken to prevent  this. The decision to have a blood transfusion has been considered carefully by your caregiver before blood is given. Blood is not given unless the benefits outweigh the risks. AFTER THE TRANSFUSION  Right after receiving a blood transfusion, you will usually feel much  better and more energetic. This is especially true if your red blood cells have gotten low (anemic). The transfusion raises the level of the red blood cells which carry oxygen, and this usually causes an energy increase.  The nurse administering the transfusion will monitor you carefully for complications. HOME CARE INSTRUCTIONS  No special instructions are needed after a transfusion. You may find your energy is better. Speak with your caregiver about any limitations on activity for underlying diseases you may have. SEEK MEDICAL CARE IF:   Your condition is not improving after your transfusion.  You develop redness or irritation at the intravenous (IV) site. SEEK IMMEDIATE MEDICAL CARE IF:  Any of the following symptoms occur over the next 12 hours:  Shaking chills.  You have a temperature by mouth above 102 F (38.9 C), not controlled by medicine.  Chest, back, or muscle pain.  People around you feel you are not acting correctly or are confused.  Shortness of breath or difficulty breathing.  Dizziness and fainting.  You get a rash or develop hives.  You have a decrease in urine output.  Your urine turns a dark color or changes to pink, red, or brown. Any of the following symptoms occur over the next 10 days:  You have a temperature by mouth above 102 F (38.9 C), not controlled by medicine.  Shortness of breath.  Weakness after normal activity.  The white part of the eye turns yellow (jaundice).  You have a decrease in the amount of urine or are urinating less often.  Your urine turns a dark color or changes to pink, red, or brown. Document Released: 08/26/2000 Document Revised: 11/21/2011 Document Reviewed: 04/14/2008 ExitCare Patient Information 2014 [place].  _______________________________________________________________________  Incentive Spirometer  An incentive spirometer is a tool that can help keep your lungs clear and active. This tool measures  how well you are filling your lungs with each breath. Taking long deep breaths may help reverse or decrease the chance of developing breathing (pulmonary) problems (especially infection) following:  A long period of time when you are unable to move or be active. BEFORE THE PROCEDURE   If the spirometer includes an indicator to show your best effort, your nurse or respiratory therapist will set it to a desired goal.  If possible, sit up straight or lean slightly forward. Try not to slouch.  Hold the incentive spirometer in an upright position. INSTRUCTIONS FOR USE  3. Sit on the edge of your bed if possible, or sit up as far as you can in bed or on a chair. 4. Hold the incentive spirometer in an upright position. 5. Breathe out normally. 6. Place the mouthpiece in your mouth and seal your lips tightly around it. 7. Breathe in slowly and as deeply as possible, raising the piston or the ball toward the top of the column. 8. Hold your breath for 3-5 seconds or for as long as possible. Allow the piston or ball to fall to the bottom of the column. 9. Remove the mouthpiece from your mouth and breathe out normally. 10. Rest for a few seconds and repeat Steps 1 through 7 at least 10 times every 1-2 hours when you are awake. Take your time and take  a few normal breaths between deep breaths. 11. The spirometer may include an indicator to show your best effort. Use the indicator as a goal to work toward during each repetition. 12. After each set of 10 deep breaths, practice coughing to be sure your lungs are clear. If you have an incision (the cut made at the time of surgery), support your incision when coughing by placing a pillow or rolled up towels firmly against it. Once you are able to get out of bed, walk around indoors and cough well. You may stop using the incentive spirometer when instructed by your caregiver.  RISKS AND COMPLICATIONS  Take your time so you do not get dizzy or  light-headed.  If you are in pain, you may need to take or ask for pain medication before doing incentive spirometry. It is harder to take a deep breath if you are having pain. AFTER USE  Rest and breathe slowly and easily.  It can be helpful to keep track of a log of your progress. Your caregiver can provide you with a simple table to help with this. If you are using the spirometer at home, follow these instructions: [place] IF:   You are having difficultly using the spirometer.  You have trouble using the spirometer as often as instructed.  Your pain medication is not giving enough relief while using the spirometer.  You develop fever of 100.5 F (38.1 C) or higher. SEEK IMMEDIATE MEDICAL CARE IF:   You cough up bloody sputum that had not been present before.  You develop fever of 102 F (38.9 C) or greater.  You develop worsening pain at or near the incision site. MAKE SURE YOU:   Understand these instructions.  Will watch your condition.  Will get help right away if you are not doing well or get worse. Document Released: 01/09/2007 Document Revised: 11/21/2011 Document Reviewed: 03/12/2007 [facility] Patient Information 2014 [place], [place].   ________________________________________________________________________

## 2015-03-04 ENCOUNTER — Encounter (HOSPITAL_COMMUNITY): Payer: Self-pay

## 2015-03-04 ENCOUNTER — Encounter (HOSPITAL_COMMUNITY)
Admission: RE | Admit: 2015-03-04 | Discharge: 2015-03-04 | Disposition: A | Discharge status: Home / Self Care | Payer: Medicare HMO | Source: Ambulatory Visit | Attending: Orthopedic Surgery | Admitting: Orthopedic Surgery

## 2015-03-04 DIAGNOSIS — Z0183 Encounter for blood typing: Secondary | ICD-10-CM | POA: Diagnosis not present

## 2015-03-04 DIAGNOSIS — M179 Osteoarthritis of knee, unspecified: Secondary | ICD-10-CM | POA: Diagnosis not present

## 2015-03-04 DIAGNOSIS — Z01812 Encounter for preprocedural laboratory examination: Secondary | ICD-10-CM | POA: Insufficient documentation

## 2015-03-04 HISTORY — DX: Pneumonia, unspecified organism: J18.9

## 2015-03-04 HISTORY — DX: Unspecified osteoarthritis, unspecified site: M19.90

## 2015-03-04 HISTORY — DX: Reserved for inherently not codable concepts without codable children: IMO0001

## 2015-03-04 HISTORY — DX: Cardiac murmur, unspecified: R01.1

## 2015-03-04 LAB — COMPREHENSIVE METABOLIC PANEL
ALT: 13 U/L — ABNORMAL LOW (ref 17–63)
AST: 23 U/L (ref 15–41)
Albumin: 4.5 g/dL (ref 3.5–5.0)
Alkaline Phosphatase: 88 U/L (ref 38–126)
Anion gap: 7 (ref 5–15)
BUN: 18 mg/dL (ref 6–20)
CO2: 30 mmol/L (ref 22–32)
Calcium: 9.8 mg/dL (ref 8.9–10.3)
Chloride: 103 mmol/L (ref 101–111)
Creatinine, Ser: 0.91 mg/dL (ref 0.61–1.24)
GFR calc Af Amer: >60 mL/min (ref >60)
GFR calc non Af Amer: >60 mL/min (ref >60)
Glucose, Bld: 85 mg/dL (ref 65–99)
Potassium: 4.8 mmol/L (ref 3.5–5.1)
Sodium: 140 mmol/L (ref 135–145)
Total Bilirubin: 0.5 mg/dL (ref 0.3–1.2)
Total Protein: 7.5 g/dL (ref 6.5–8.1)

## 2015-03-04 LAB — CBC WITH DIFFERENTIAL/PLATELET
Basophils Absolute: 0 10*3/uL (ref 0.0–0.1)
Basophils Relative: 0 % (ref 0–1)
Eosinophils Absolute: 0.7 10*3/uL (ref 0.0–0.7)
Eosinophils Relative: 7 % — ABNORMAL HIGH (ref 0–5)
HCT: 43.4 % (ref 39.0–52.0)
Hemoglobin: 14.2 g/dL (ref 13.0–17.0)
Lymphocytes Relative: 29 % (ref 12–46)
Lymphs Abs: 2.6 10*3/uL (ref 0.7–4.0)
MCH: 29.2 pg (ref 26.0–34.0)
MCHC: 32.7 g/dL (ref 30.0–36.0)
MCV: 89.1 fL (ref 78.0–100.0)
Monocytes Absolute: 0.8 10*3/uL (ref 0.1–1.0)
Monocytes Relative: 9 % (ref 3–12)
Neutro Abs: 4.7 10*3/uL (ref 1.7–7.7)
Neutrophils Relative %: 55 % (ref 43–77)
Platelets: 189 10*3/uL (ref 150–400)
RBC: 4.87 MIL/uL (ref 4.22–5.81)
RDW: 13 % (ref 11.5–15.5)
WBC: 8.7 10*3/uL (ref 4.0–10.5)

## 2015-03-04 LAB — URINALYSIS, ROUTINE W REFLEX MICROSCOPIC
Bilirubin Urine: NEGATIVE
Glucose, UA: NEGATIVE mg/dL
Hgb urine dipstick: NEGATIVE
Ketones, ur: NEGATIVE mg/dL
Leukocytes, UA: NEGATIVE
Nitrite: NEGATIVE
Protein, ur: NEGATIVE mg/dL
Specific Gravity, Urine: 1.013 (ref 1.005–1.030)
Urobilinogen, UA: 0.2 mg/dL (ref 0.0–1.0)
pH: 7.5 (ref 5.0–8.0)

## 2015-03-04 LAB — SURGICAL PCR SCREEN
MRSA, PCR: NEGATIVE
Staphylococcus aureus: NEGATIVE

## 2015-03-04 LAB — APTT: aPTT: 35 seconds (ref 24–37)

## 2015-03-04 LAB — PROTIME-INR
INR: 1.64 — ABNORMAL HIGH (ref 0.00–1.49)
Prothrombin Time: 19.4 seconds — ABNORMAL HIGH (ref 11.6–15.2)

## 2015-03-04 NOTE — Progress Notes (Signed)
PT/INR results done 03/04/15 faxed via EPIC to Dr Gladstone Lighter.

## 2015-03-04 NOTE — Progress Notes (Addendum)
2V CXR- 01/16/2015 on chart  12/15/2014- LOV note Dr Gladstone Lighter on chart  EKG- 01/26/15- EPIC  01/19/15 ECHO-EPIC Clearance - pulm- 01/23/15 - Dr Gwenette Greet in [facility]  02/10/15- LOV- pulm-Dr Clance- EPIC  01/26/15- OV with Chanetta Marshall, Np - note on chart  Clearance- Dr Caryl Comes, Chanetta Marshall, Np- 01/26/15 on chart  Clearance- 01/16/2015- [place] [facility] [place]- 01/16/2015  On chart

## 2015-03-04 NOTE — Progress Notes (Signed)
Called patient 's wife at home to clarify medications patient is currently taking.  Wife reviewed current med list with nurse.  Spiriva  2.5 mg daily  added to medications as well as Valsartan 160mg  ( 1/2 tab) daily.  Also added Leader allergy tablet( OTC) daily to medication list.  Instructed wife to remove Dulera and Daliresp from the preop instructions since patient no longer taking and to add Spiriva to the preop medication list.  Wife voiced understanding.

## 2015-03-08 NOTE — H&P (Signed)
TOTAL KNEE ADMISSION H&P  Patient is being admitted for left total knee arthroplasty.  Subjective:  Chief Complaint:left knee pain.  HPI: [name], 80 y.o. male, has a history of pain and functional disability in the left knee due to arthritis and has failed non-surgical conservative treatments for greater than 12 weeks to includeNSAID's and/or analgesics, corticosteriod injections, viscosupplementation injections and activity modification.  Onset of symptoms was gradual, starting 5 years ago with gradually worsening course since that time. The patient noted no past surgery on the left knee(s).  Patient currently rates pain in the left knee(s) at 6 out of 10 with activity. Patient has night pain, worsening of pain with activity and weight bearing, pain that interferes with activities of daily living, pain with passive range of motion, crepitus and joint swelling.  Patient has evidence of periarticular osteophytes and joint space narrowing by imaging studies.  There is no active infection.  Patient Active Problem List   Diagnosis Date Noted  . Osteoarthritis of right knee 02/19/2014  . Total knee replacement status 02/19/2014  . Hypertension   . GERD (gastroesophageal reflux disease) 06/07/2013  . Chronic diastolic heart failure 76/81/1572  . Inguinal hernia with incarceration 03/07/2013  . Benign neoplasm of colon 02/07/2013  . Hyperthyroidism 01/28/2013  . Anxiety state, unspecified 01/28/2013  . Chronic pain syndrome 01/28/2013  . Diverticulitis of colon (without mention of hemorrhage) 01/03/2013  . AV BLOCK, 1ST DEGREE 07/13/2010  . SVT/ PSVT/ PAT 05/04/2010  . HYPERLIPIDEMIA 05/15/2009  . PAROXYSMAL ATRIAL FIBRILLATION 05/15/2009  . TIA 05/15/2009  . COPD with emphysema 05/15/2009  . GERD 05/15/2009   Past Medical History  Diagnosis Date  . Paroxysmal atrial fibrillation     coumadin therapy;  echo 1/10: EF 65-70%, mild diast dysfxn  . Hyperlipidemia   . GERD  (gastroesophageal reflux disease)   . Allergic rhinitis   . Asthma   . Anxiety   . Sebaceous cyst     scalp  . SVT (supraventricular tachycardia)     s/p RFCA 05/2010  . History of TIAs   . Hiatal hernia   . COPD (chronic obstructive pulmonary disease)     admitted 5/11, uses oxygen 2 liters/min prn rare use  . Tubular adenoma 2014    Dr. Hilarie Fredrickson  . Hypertension   . First degree AV block   . CAD (coronary artery disease)     cath 7/09: pCFX 25%, mRCA 25%, EF 60%;  Myoview 6/11: low risk, EF 67%  . Heart murmur   . Shortness of breath dyspnea     with exertion   . Pneumonia     hx of   . Arthritis     Past Surgical History  Procedure Laterality Date  . Cystectomy  2012    removed from head  . Colonoscopy N/A 02/07/2013    Procedure: COLONOSCOPY;  Surgeon: Jerene Bears, MD;  Location: [facility] ENDOSCOPY;  Service: Gastroenterology;  Laterality: N/A;  . Esophagogastroduodenoscopy N/A 02/07/2013    Procedure: ESOPHAGOGASTRODUODENOSCOPY (EGD);  Surgeon: Jerene Bears, MD;  Location: [facility];  Service: Gastroenterology;  Laterality: N/A;  . Back surgery  1980    lower L 4 to L 5  . Cardiac electrophysiology mapping and ablation  2008    Dr Caryl Comes - details not avaialble in [facility]  . Inguinal hernia repair Left 04/15/2013    Procedure: LEFT HERNIA REPAIR INGUINAL INCARCERATED;  Surgeon: Gayland Curry, MD;  Location: [facility] ORS;  Service: General;  Laterality:  Left;  . Insertion of mesh Left 04/15/2013    Procedure: INSERTION OF MESH;  Surgeon: Gayland Curry, MD;  Location: [facility] ORS;  Service: General;  Laterality: Left;  . Total knee arthroplasty Right 02/19/2014    Procedure: RIGHT TOTAL KNEE ARTHROPLASTY;  Surgeon: Tobi Bastos, MD;  Location: [facility] ORS;  Service: Orthopedics;  Laterality: Right;  . Cardioversion  2009    Dr Percival Spanish    . Current outpatient prescriptions:  .  albuterol (PROVENTIL HFA;VENTOLIN HFA) 108 (90 BASE) MCG/ACT inhaler, Inhale 2 puffs into the lungs every 6 (six)  hours as needed for wheezing or shortness of breath., Disp: ,  .  Calcium Carb-Cholecalciferol (CALCIUM 600 + D PO), Take 1 tablet by mouth every morning., Disp: , Rfl:  .  ipratropium-albuterol (DUONEB) 0.5-2.5 (3) MG/3ML SOLN, Take 3 mLs by nebulization every 4 (four) hours as needed (FOR WHEEZING)., Disp: , Rfl:  .  magnesium oxide (MAG-OX) 400 MG tablet, Take 400 mg by mouth every morning. , Disp: , Rfl:  .  multivitamin-iron-minerals-folic acid (CENTRUM) chewable tablet, Chew 1 tablet by mouth every morning. , Disp: , Rfl:  .  vitamin B-12 (CYANOCOBALAMIN) 1000 MCG tablet, Take 1,000 mcg by mouth every morning. , Disp: , Rfl:  .  warfarin (COUMADIN) 1 MG tablet, Take 3-4 mg by mouth daily., Disp: , Rfl:  .  OVER THE COUNTER MEDICATION, Leader allergy tablet chlorpheniramine antihistamine tablet daily, Disp: , Rfl:  .  tiotropium (SPIRIVA) 18 MCG inhalation capsule, Place 18 mcg into inhaler and inhale daily., Disp: , Rfl:  .  valsartan (DIOVAN) 160 MG tablet, Take 80 mg by mouth daily., Disp: , Rfl:     No Known Allergies  History  Substance Use Topics  . Smoking status: Former Smoker -- 3.00 packs/day for 40 years    Types: Cigarettes    Quit date: 09/13/1995  . Smokeless tobacco: Never Used  . Alcohol Use: No    Family History  Problem Relation Age of Onset  . Hypertension Mother   . COPD Father   . COPD Sister   . Cancer Brother     lung  . Lung cancer Mother      Review of Systems  Constitutional: Negative.   HENT: Negative.   Eyes: Negative.   Respiratory: Positive for cough, shortness of breath and wheezing. Negative for hemoptysis and sputum production.   Cardiovascular: Negative.   Gastrointestinal: Negative.   Genitourinary: Negative.   Musculoskeletal: Positive for joint pain. Negative for myalgias, back pain, falls and neck pain.       Left knee pain  Skin: Negative.   Neurological: Negative.   Endo/Heme/Allergies: Negative.   Psychiatric/Behavioral:  Negative.     Objective:  Physical Exam  Constitutional: He is oriented to person, place, and time. He appears well-developed and well-nourished. No distress.  HENT:  Head: Normocephalic and atraumatic.  Right Ear: External ear normal.  Left Ear: External ear normal.  Nose: Nose normal.  Mouth/Throat: Oropharynx is clear and moist.  Eyes: Conjunctivae and EOM are normal.  Neck: Normal range of motion. Neck supple.  Cardiovascular: Normal rate, normal heart sounds and intact distal pulses.  An irregularly irregular rhythm present.  No murmur heard. Respiratory: Effort normal. No respiratory distress. He has wheezes.  GI: Soft. Bowel sounds are normal. He exhibits no distension. There is no tenderness.  Musculoskeletal:       Right hip: Normal.       Left hip: Normal.  Right knee: Normal.       Left knee: He exhibits decreased range of motion and swelling. He exhibits no effusion and no erythema. Tenderness found. Medial joint line and lateral joint line tenderness noted.  Neurological: He is alert and oriented to person, place, and time. He has normal strength and normal reflexes. No sensory deficit.  Skin: No rash noted. He is not diaphoretic. No erythema.  Psychiatric: He has a normal mood and affect. His behavior is normal.    Vital signs Weight: 180 lb Height: 71.5in Body Surface Area: 2.03 m Body Mass Index: 24.75 kg/m  Pulse: 76 (Regular)  BP: 130/86 (Sitting, Left Arm, Standard)   Imaging Review Plain radiographs demonstrate severe degenerative joint disease of the left knee(s). The overall alignment ismild varus. The bone quality appears to be good for age and reported activity level.  Assessment/Plan:  End stage primary osteoarthritis, left knee   The patient history, physical examination, clinical judgment of the provider and imaging studies are consistent with end stage degenerative joint disease of the left knee(s) and total knee arthroplasty is  deemed medically necessary. The treatment options including medical management, injection therapy arthroscopy and arthroplasty were discussed at length. The risks and benefits of total knee arthroplasty were presented and reviewed. The risks due to aseptic loosening, infection, stiffness, patella tracking problems, thromboembolic complications and other imponderables were discussed. The patient acknowledged the explanation, agreed to proceed with the plan and consent was signed. Patient is being admitted for inpatient treatment for surgery, pain control, PT, OT, prophylactic antibiotics, VTE prophylaxis, progressive ambulation and ADL's and discharge planning. The patient is planning to be discharged home with home health services  TXA topical  Cardio: Dr. Caryl Comes Pulm: Dr. Johney [place], PA-C

## 2015-03-11 ENCOUNTER — Encounter (HOSPITAL_COMMUNITY)
Admission: RE | Disposition: A | Discharge status: Home Health | Payer: Self-pay | Source: Ambulatory Visit | Attending: Orthopedic Surgery

## 2015-03-11 ENCOUNTER — Encounter (HOSPITAL_COMMUNITY): Payer: Self-pay | Admitting: *Deleted

## 2015-03-11 ENCOUNTER — Inpatient Hospital Stay (HOSPITAL_COMMUNITY): Payer: Medicare HMO | Admitting: Certified Registered"

## 2015-03-11 ENCOUNTER — Inpatient Hospital Stay (HOSPITAL_COMMUNITY)
Admission: RE | Admit: 2015-03-11 | Discharge: 2015-03-13 | DRG: 470 | Disposition: A | Discharge status: Home Health | Payer: Medicare HMO | Source: Ambulatory Visit | Attending: Orthopedic Surgery | Admitting: Orthopedic Surgery

## 2015-03-11 DIAGNOSIS — I4891 Unspecified atrial fibrillation: Secondary | ICD-10-CM | POA: Diagnosis present

## 2015-03-11 DIAGNOSIS — Z87891 Personal history of nicotine dependence: Secondary | ICD-10-CM

## 2015-03-11 DIAGNOSIS — K449 Diaphragmatic hernia without obstruction or gangrene: Secondary | ICD-10-CM | POA: Diagnosis present

## 2015-03-11 DIAGNOSIS — Z8673 Personal history of transient ischemic attack (TIA), and cerebral infarction without residual deficits: Secondary | ICD-10-CM | POA: Diagnosis not present

## 2015-03-11 DIAGNOSIS — Z01812 Encounter for preprocedural laboratory examination: Secondary | ICD-10-CM | POA: Diagnosis not present

## 2015-03-11 DIAGNOSIS — Z79899 Other long term (current) drug therapy: Secondary | ICD-10-CM | POA: Diagnosis not present

## 2015-03-11 DIAGNOSIS — I10 Essential (primary) hypertension: Secondary | ICD-10-CM | POA: Diagnosis present

## 2015-03-11 DIAGNOSIS — I5032 Chronic diastolic (congestive) heart failure: Secondary | ICD-10-CM | POA: Diagnosis present

## 2015-03-11 DIAGNOSIS — M25562 Pain in left knee: Secondary | ICD-10-CM | POA: Diagnosis present

## 2015-03-11 DIAGNOSIS — E0590 Thyrotoxicosis, unspecified without thyrotoxic crisis or storm: Secondary | ICD-10-CM | POA: Diagnosis present

## 2015-03-11 DIAGNOSIS — I2510 Atherosclerotic heart disease of native coronary artery without angina pectoris: Secondary | ICD-10-CM | POA: Diagnosis present

## 2015-03-11 DIAGNOSIS — K219 Gastro-esophageal reflux disease without esophagitis: Secondary | ICD-10-CM | POA: Diagnosis present

## 2015-03-11 DIAGNOSIS — M1712 Unilateral primary osteoarthritis, left knee: Secondary | ICD-10-CM | POA: Diagnosis present

## 2015-03-11 DIAGNOSIS — J449 Chronic obstructive pulmonary disease, unspecified: Secondary | ICD-10-CM | POA: Diagnosis present

## 2015-03-11 DIAGNOSIS — Z96659 Presence of unspecified artificial knee joint: Secondary | ICD-10-CM

## 2015-03-11 DIAGNOSIS — Z7901 Long term (current) use of anticoagulants: Secondary | ICD-10-CM | POA: Diagnosis not present

## 2015-03-11 HISTORY — PX: TOTAL KNEE ARTHROPLASTY: SHX125

## 2015-03-11 LAB — TYPE AND SCREEN
ABO/RH(D): O POS
ABO/RH(D): O POS
ANTIBODY SCREEN: NEGATIVE
Antibody Screen: NEGATIVE

## 2015-03-11 LAB — PROTIME-INR
INR: 1.06 (ref 0.00–1.49)
Prothrombin Time: 14 seconds (ref 11.6–15.2)

## 2015-03-11 SURGERY — ARTHROPLASTY, KNEE, TOTAL
Anesthesia: General | Site: Knee | Laterality: Left

## 2015-03-11 MED ORDER — ROCURONIUM BROMIDE 100 MG/10ML IV SOLN
INTRAVENOUS | Status: AC
  Filled 2015-03-11: qty 1

## 2015-03-11 MED ORDER — ALUM & MAG HYDROXIDE-SIMETH 200-200-20 MG/5ML PO SUSP
30.0000 mL | ORAL | Status: DC | PRN
  Administered 2015-03-12: 30 mL via ORAL
  Filled 2015-03-11: qty 30

## 2015-03-11 MED ORDER — SODIUM CHLORIDE 0.9 % IV SOLN
2000.0000 mg | Freq: Once | INTRAVENOUS | Status: AC
  Administered 2015-03-11: 2000 mg via TOPICAL
  Filled 2015-03-11: qty 20

## 2015-03-11 MED ORDER — MIDAZOLAM HCL 5 MG/5ML IJ SOLN
INTRAMUSCULAR | Status: DC | PRN
  Administered 2015-03-11 (×2): 1 mg via INTRAVENOUS

## 2015-03-11 MED ORDER — BUPIVACAINE HCL (PF) 0.25 % IJ SOLN
INTRAMUSCULAR | Status: DC | PRN
  Administered 2015-03-11: 20 mL

## 2015-03-11 MED ORDER — CELECOXIB 200 MG PO CAPS
200.0000 mg | ORAL_CAPSULE | Freq: Two times a day (BID) | ORAL | Status: DC
  Administered 2015-03-11 – 2015-03-13 (×4): 200 mg via ORAL
  Filled 2015-03-11 (×6): qty 1

## 2015-03-11 MED ORDER — SODIUM CHLORIDE 0.9 % IJ SOLN
INTRAMUSCULAR | Status: AC
  Filled 2015-03-11: qty 20

## 2015-03-11 MED ORDER — LACTATED RINGERS IV SOLN
INTRAVENOUS | Status: DC
  Administered 2015-03-11: 1000 mL via INTRAVENOUS

## 2015-03-11 MED ORDER — BISACODYL 5 MG PO TBEC: 5.0000 mg | DELAYED_RELEASE_TABLET | Freq: Every day | ORAL | Status: DC | PRN

## 2015-03-11 MED ORDER — CHLORHEXIDINE GLUCONATE 4 % EX LIQD: 60.0000 mL | Freq: Once | CUTANEOUS | Status: DC

## 2015-03-11 MED ORDER — FLEET ENEMA 7-19 GM/118ML RE ENEM: 1.0000 | ENEMA | Freq: Once | RECTAL | Status: AC | PRN

## 2015-03-11 MED ORDER — LIDOCAINE HCL (PF) 2 % IJ SOLN
INTRAMUSCULAR | Status: DC | PRN
  Administered 2015-03-11: 20 mg via INTRADERMAL

## 2015-03-11 MED ORDER — HYDROMORPHONE HCL 1 MG/ML IJ SOLN
INTRAMUSCULAR | Status: AC
  Administered 2015-03-11: 1 mg via INTRAVENOUS
  Filled 2015-03-11: qty 1

## 2015-03-11 MED ORDER — ACETAMINOPHEN 650 MG RE SUPP: 650.0000 mg | Freq: Four times a day (QID) | RECTAL | Status: DC | PRN

## 2015-03-11 MED ORDER — ONDANSETRON HCL 4 MG PO TABS: 4.0000 mg | ORAL_TABLET | Freq: Four times a day (QID) | ORAL | Status: DC | PRN

## 2015-03-11 MED ORDER — PROMETHAZINE HCL 25 MG/ML IJ SOLN
12.5000 mg | Freq: Four times a day (QID) | INTRAMUSCULAR | Status: DC | PRN
Start: 2015-03-11 — End: 2015-03-13
  Administered 2015-03-11: 12.5 mg via INTRAVENOUS
  Filled 2015-03-11: qty 1

## 2015-03-11 MED ORDER — THROMBIN 5000 UNITS EX SOLR
CUTANEOUS | Status: AC
  Filled 2015-03-11: qty 5000

## 2015-03-11 MED ORDER — PROPOFOL 10 MG/ML IV BOLUS
INTRAVENOUS | Status: AC
  Filled 2015-03-11: qty 20

## 2015-03-11 MED ORDER — POLYETHYLENE GLYCOL 3350 17 G PO PACK: 17.0000 g | PACK | Freq: Every day | ORAL | Status: DC | PRN

## 2015-03-11 MED ORDER — HYDROMORPHONE HCL 2 MG/ML IJ SOLN
INTRAMUSCULAR | Status: AC
  Filled 2015-03-11: qty 1

## 2015-03-11 MED ORDER — METHOCARBAMOL 500 MG PO TABS
500.0000 mg | ORAL_TABLET | Freq: Four times a day (QID) | ORAL | Status: DC | PRN
  Administered 2015-03-12 – 2015-03-13 (×4): 500 mg via ORAL
  Filled 2015-03-11 (×4): qty 1

## 2015-03-11 MED ORDER — SUCCINYLCHOLINE CHLORIDE 20 MG/ML IJ SOLN
INTRAMUSCULAR | Status: DC | PRN
  Administered 2015-03-11: 100 mg via INTRAVENOUS

## 2015-03-11 MED ORDER — DEXTROSE 5 % IV SOLN
500.0000 mg | Freq: Four times a day (QID) | INTRAVENOUS | Status: DC | PRN
  Administered 2015-03-11: 500 mg via INTRAVENOUS
  Filled 2015-03-11 (×2): qty 5

## 2015-03-11 MED ORDER — SODIUM CHLORIDE 0.9 % IJ SOLN
INTRAMUSCULAR | Status: DC | PRN
  Administered 2015-03-11: 20 mL

## 2015-03-11 MED ORDER — FENTANYL CITRATE (PF) 100 MCG/2ML IJ SOLN
25.0000 ug | INTRAMUSCULAR | Status: DC | PRN
  Administered 2015-03-11 (×2): 50 ug via INTRAVENOUS

## 2015-03-11 MED ORDER — MEPERIDINE HCL 50 MG/ML IJ SOLN: 6.2500 mg | INTRAMUSCULAR | Status: DC | PRN

## 2015-03-11 MED ORDER — ALBUTEROL SULFATE HFA 108 (90 BASE) MCG/ACT IN AERS: 2.0000 | INHALATION_SPRAY | Freq: Four times a day (QID) | RESPIRATORY_TRACT | Status: DC | PRN

## 2015-03-11 MED ORDER — ACETAMINOPHEN 325 MG PO TABS: 650.0000 mg | ORAL_TABLET | Freq: Four times a day (QID) | ORAL | Status: DC | PRN

## 2015-03-11 MED ORDER — PHENOL 1.4 % MT LIQD: 1.0000 | OROMUCOSAL | Status: DC | PRN

## 2015-03-11 MED ORDER — WARFARIN - PHARMACIST DOSING INPATIENT: Freq: Every day | Status: DC

## 2015-03-11 MED ORDER — LACTATED RINGERS IV SOLN
INTRAVENOUS | Status: DC
  Administered 2015-03-11 (×2): via INTRAVENOUS

## 2015-03-11 MED ORDER — BUPIVACAINE LIPOSOME 1.3 % IJ SUSP
20.0000 mL | Freq: Once | INTRAMUSCULAR | Status: AC
  Administered 2015-03-11: 20 mL
  Filled 2015-03-11: qty 20

## 2015-03-11 MED ORDER — WARFARIN SODIUM 6 MG PO TABS
6.0000 mg | ORAL_TABLET | Freq: Once | ORAL | Status: AC
  Administered 2015-03-11: 6 mg via ORAL
  Filled 2015-03-11: qty 1

## 2015-03-11 MED ORDER — FENTANYL CITRATE (PF) 100 MCG/2ML IJ SOLN
INTRAMUSCULAR | Status: AC
  Filled 2015-03-11: qty 2

## 2015-03-11 MED ORDER — CEFAZOLIN SODIUM-DEXTROSE 2-3 GM-% IV SOLR
INTRAVENOUS | Status: AC
  Filled 2015-03-11: qty 50

## 2015-03-11 MED ORDER — OXYCODONE-ACETAMINOPHEN 5-325 MG PO TABS: 2.0000 | ORAL_TABLET | ORAL | Status: DC | PRN

## 2015-03-11 MED ORDER — IRBESARTAN 75 MG PO TABS
37.5000 mg | ORAL_TABLET | Freq: Every day | ORAL | Status: DC
  Administered 2015-03-11 – 2015-03-13 (×3): 37.5 mg via ORAL
  Filled 2015-03-11 (×3): qty 0.5

## 2015-03-11 MED ORDER — SODIUM CHLORIDE 0.9 % IR SOLN
Status: DC | PRN
  Administered 2015-03-11: 500 mL

## 2015-03-11 MED ORDER — FERROUS SULFATE 325 (65 FE) MG PO TABS
325.0000 mg | ORAL_TABLET | Freq: Three times a day (TID) | ORAL | Status: DC
  Administered 2015-03-12 – 2015-03-13 (×4): 325 mg via ORAL
  Filled 2015-03-11 (×8): qty 1

## 2015-03-11 MED ORDER — CEFAZOLIN SODIUM 1-5 GM-% IV SOLN
1.0000 g | Freq: Four times a day (QID) | INTRAVENOUS | Status: AC
  Administered 2015-03-11 (×2): 1 g via INTRAVENOUS
  Filled 2015-03-11 (×2): qty 50

## 2015-03-11 MED ORDER — BUPIVACAINE HCL (PF) 0.25 % IJ SOLN
INTRAMUSCULAR | Status: AC
  Filled 2015-03-11: qty 30

## 2015-03-11 MED ORDER — FENTANYL CITRATE (PF) 100 MCG/2ML IJ SOLN
INTRAMUSCULAR | Status: DC | PRN
  Administered 2015-03-11: 50 ug via INTRAVENOUS
  Administered 2015-03-11: 100 ug via INTRAVENOUS

## 2015-03-11 MED ORDER — LACTATED RINGERS IV SOLN: INTRAVENOUS | Status: DC

## 2015-03-11 MED ORDER — HYDROMORPHONE HCL 1 MG/ML IJ SOLN
0.2500 mg | INTRAMUSCULAR | Status: DC | PRN
  Administered 2015-03-11: 0.5 mg via INTRAVENOUS

## 2015-03-11 MED ORDER — DEXAMETHASONE SODIUM PHOSPHATE 10 MG/ML IJ SOLN
INTRAMUSCULAR | Status: AC
  Filled 2015-03-11: qty 1

## 2015-03-11 MED ORDER — PROPOFOL 10 MG/ML IV BOLUS
INTRAVENOUS | Status: DC | PRN
  Administered 2015-03-11: 150 mg via INTRAVENOUS

## 2015-03-11 MED ORDER — CEFAZOLIN SODIUM-DEXTROSE 2-3 GM-% IV SOLR
2.0000 g | INTRAVENOUS | Status: AC
  Administered 2015-03-11: 2 g via INTRAVENOUS

## 2015-03-11 MED ORDER — ONDANSETRON HCL 4 MG/2ML IJ SOLN
INTRAMUSCULAR | Status: AC
  Filled 2015-03-11: qty 2

## 2015-03-11 MED ORDER — TIOTROPIUM BROMIDE MONOHYDRATE 18 MCG IN CAPS
18.0000 ug | ORAL_CAPSULE | Freq: Every day | RESPIRATORY_TRACT | Status: DC
  Filled 2015-03-11: qty 5

## 2015-03-11 MED ORDER — IPRATROPIUM-ALBUTEROL 0.5-2.5 (3) MG/3ML IN SOLN: 3.0000 mL | RESPIRATORY_TRACT | Status: DC | PRN

## 2015-03-11 MED ORDER — SODIUM CHLORIDE 0.9 % IR SOLN
Status: AC
  Filled 2015-03-11: qty 1

## 2015-03-11 MED ORDER — MIDAZOLAM HCL 2 MG/2ML IJ SOLN
INTRAMUSCULAR | Status: AC
  Filled 2015-03-11: qty 2

## 2015-03-11 MED ORDER — DEXAMETHASONE SODIUM PHOSPHATE 10 MG/ML IJ SOLN
INTRAMUSCULAR | Status: DC | PRN
  Administered 2015-03-11: 10 mg via INTRAVENOUS

## 2015-03-11 MED ORDER — BACITRACIN ZINC 500 UNIT/GM EX OINT
TOPICAL_OINTMENT | CUTANEOUS | Status: AC
  Filled 2015-03-11: qty 28.35

## 2015-03-11 MED ORDER — PROMETHAZINE HCL 25 MG/ML IJ SOLN: 6.2500 mg | INTRAMUSCULAR | Status: DC | PRN

## 2015-03-11 MED ORDER — HYDROMORPHONE HCL 1 MG/ML IJ SOLN
1.0000 mg | INTRAMUSCULAR | Status: DC | PRN
  Administered 2015-03-11: 1 mg via INTRAVENOUS
  Filled 2015-03-11: qty 1

## 2015-03-11 MED ORDER — ONDANSETRON HCL 4 MG/2ML IJ SOLN
4.0000 mg | Freq: Four times a day (QID) | INTRAMUSCULAR | Status: DC | PRN
  Administered 2015-03-11: 4 mg via INTRAVENOUS
  Filled 2015-03-11: qty 2

## 2015-03-11 MED ORDER — ALBUTEROL SULFATE (2.5 MG/3ML) 0.083% IN NEBU
2.5000 mg | INHALATION_SOLUTION | Freq: Four times a day (QID) | RESPIRATORY_TRACT | Status: DC | PRN
Start: 2015-03-11 — End: 2015-03-13

## 2015-03-11 MED ORDER — ONDANSETRON HCL 4 MG/2ML IJ SOLN
INTRAMUSCULAR | Status: DC | PRN
  Administered 2015-03-11: 4 mg via INTRAVENOUS

## 2015-03-11 MED ORDER — MENTHOL 3 MG MT LOZG: 1.0000 | LOZENGE | OROMUCOSAL | Status: DC | PRN

## 2015-03-11 MED ORDER — HYDROCODONE-ACETAMINOPHEN 5-325 MG PO TABS
1.0000 | ORAL_TABLET | ORAL | Status: DC | PRN
  Administered 2015-03-11 – 2015-03-13 (×8): 1 via ORAL
  Filled 2015-03-11 (×8): qty 1

## 2015-03-11 MED ORDER — THROMBIN 5000 UNITS EX SOLR
OROMUCOSAL | Status: DC | PRN
  Administered 2015-03-11: 12:00:00 via TOPICAL

## 2015-03-11 MED ORDER — HYDROMORPHONE HCL 1 MG/ML IJ SOLN
INTRAMUSCULAR | Status: DC | PRN
  Administered 2015-03-11 (×2): 1 mg via INTRAVENOUS

## 2015-03-11 SURGICAL SUPPLY — 74 items
BAG DECANTER FOR FLEXI CONT (MISCELLANEOUS) IMPLANT
BAG ZIPLOCK 12X15 (MISCELLANEOUS) IMPLANT
BANDAGE ELASTIC 4 VELCRO ST LF (GAUZE/BANDAGES/DRESSINGS) ×3 IMPLANT
BANDAGE ELASTIC 6 VELCRO ST LF (GAUZE/BANDAGES/DRESSINGS) ×3 IMPLANT
BANDAGE ESMARK 6X9 LF (GAUZE/BANDAGES/DRESSINGS) ×1 IMPLANT
BLADE SAG 18X100X1.27 (BLADE) ×3 IMPLANT
BLADE SAW SGTL 11.0X1.19X90.0M (BLADE) ×3 IMPLANT
BNDG ESMARK 6X9 LF (GAUZE/BANDAGES/DRESSINGS) ×3
BONE CEMENT GENTAMICIN (Cement) ×6 IMPLANT
CAP KNEE TOTAL 3 SIGMA ×3 IMPLANT
CEMENT BONE GENTAMICIN 40 (Cement) ×2 IMPLANT
CUFF TOURN SGL QUICK 34 (TOURNIQUET CUFF) ×2
CUFF TRNQT CYL 34X4X40X1 (TOURNIQUET CUFF) ×1 IMPLANT
DECANTER SPIKE VIAL GLASS SM (MISCELLANEOUS) ×3 IMPLANT
DRAPE EXTREMITY T 121X128X90 (DRAPE) ×3 IMPLANT
DRAPE INCISE IOBAN 66X45 STRL (DRAPES) IMPLANT
DRAPE POUCH INSTRU U-SHP 10X18 (DRAPES) ×3 IMPLANT
DRAPE SHEET LG 3/4 BI-LAMINATE (DRAPES) ×3 IMPLANT
DRAPE U-SHAPE 47X51 STRL (DRAPES) ×3 IMPLANT
DRSG AQUACEL AG ADV 3.5X10 (GAUZE/BANDAGES/DRESSINGS) ×3 IMPLANT
DRSG PAD ABDOMINAL 8X10 ST (GAUZE/BANDAGES/DRESSINGS) IMPLANT
DRSG TEGADERM 2-3/8X2-3/4 SM (GAUZE/BANDAGES/DRESSINGS) ×3 IMPLANT
DRSG TEGADERM 4X4.75 (GAUZE/BANDAGES/DRESSINGS) ×3 IMPLANT
DURAPREP 26ML APPLICATOR (WOUND CARE) ×3 IMPLANT
ELECT REM PT RETURN 9FT ADLT (ELECTROSURGICAL) ×3
ELECTRODE REM PT RTRN 9FT ADLT (ELECTROSURGICAL) ×1 IMPLANT
EVACUATOR 1/8 PVC DRAIN (DRAIN) ×3 IMPLANT
FACESHIELD WRAPAROUND (MASK) ×15 IMPLANT
GAUZE SPONGE 2X2 8PLY STRL LF (GAUZE/BANDAGES/DRESSINGS) ×1 IMPLANT
GLOVE BIOGEL PI IND STRL 6.5 (GLOVE) ×1 IMPLANT
GLOVE BIOGEL PI IND STRL 8 (GLOVE) ×2 IMPLANT
GLOVE BIOGEL PI INDICATOR 6.5 (GLOVE) ×2
GLOVE BIOGEL PI INDICATOR 8 (GLOVE) ×4
GLOVE ECLIPSE 8.0 STRL XLNG CF (GLOVE) ×6 IMPLANT
GLOVE SURG SS PI 6.5 STRL IVOR (GLOVE) ×3 IMPLANT
GOWN STRL REUS W/TWL LRG LVL3 (GOWN DISPOSABLE) ×3 IMPLANT
GOWN STRL REUS W/TWL XL LVL3 (GOWN DISPOSABLE) ×3 IMPLANT
HANDPIECE INTERPULSE COAX TIP (DISPOSABLE) ×2
IMMOBILIZER KNEE 20 (SOFTGOODS) ×3
IMMOBILIZER KNEE 20 THIGH 36 (SOFTGOODS) ×1 IMPLANT
KIT BASIN OR (CUSTOM PROCEDURE TRAY) ×3 IMPLANT
LIQUID BAND (GAUZE/BANDAGES/DRESSINGS) ×3 IMPLANT
MANIFOLD NEPTUNE II (INSTRUMENTS) ×3 IMPLANT
NDL SAFETY ECLIPSE 18X1.5 (NEEDLE) ×2 IMPLANT
NEEDLE HYPO 18GX1.5 SHARP (NEEDLE) ×4
NEEDLE HYPO 22GX1.5 SAFETY (NEEDLE) ×3 IMPLANT
NS IRRIG 1000ML POUR BTL (IV SOLUTION) ×3 IMPLANT
PACK TOTAL JOINT (CUSTOM PROCEDURE TRAY) ×3 IMPLANT
PADDING CAST COTTON 6X4 STRL (CAST SUPPLIES) IMPLANT
PEN SKIN MARKING BROAD (MISCELLANEOUS) ×3 IMPLANT
POSITIONER SURGICAL ARM (MISCELLANEOUS) ×3 IMPLANT
SET HNDPC FAN SPRY TIP SCT (DISPOSABLE) ×1 IMPLANT
SET PAD KNEE POSITIONER (MISCELLANEOUS) ×3 IMPLANT
SPONGE GAUZE 2X2 STER 10/PKG (GAUZE/BANDAGES/DRESSINGS) ×2
SPONGE LAP 18X18 X RAY DECT (DISPOSABLE) IMPLANT
SPONGE SURGIFOAM ABS GEL 100 (HEMOSTASIS) ×3 IMPLANT
STAPLER VISISTAT 35W (STAPLE) IMPLANT
SUCTION FRAZIER 12FR DISP (SUCTIONS) ×3 IMPLANT
SUT BONE WAX W31G (SUTURE) ×3 IMPLANT
SUT MNCRL AB 4-0 PS2 18 (SUTURE) ×3 IMPLANT
SUT VIC AB 1 CT1 27 (SUTURE) ×4
SUT VIC AB 1 CT1 27XBRD ANTBC (SUTURE) ×2 IMPLANT
SUT VIC AB 2-0 CT1 27 (SUTURE) ×6
SUT VIC AB 2-0 CT1 TAPERPNT 27 (SUTURE) ×3 IMPLANT
SUT VLOC 180 0 24IN GS25 (SUTURE) ×3 IMPLANT
SYR 20CC LL (SYRINGE) ×6 IMPLANT
SYR 50ML LL SCALE MARK (SYRINGE) ×3 IMPLANT
TOWEL OR 17X26 10 PK STRL BLUE (TOWEL DISPOSABLE) ×3 IMPLANT
TOWEL OR NON WOVEN STRL DISP B (DISPOSABLE) ×3 IMPLANT
TOWER CARTRIDGE SMART MIX (DISPOSABLE) ×3 IMPLANT
TRAY FOLEY W/METER SILVER 14FR (SET/KITS/TRAYS/PACK) IMPLANT
WATER STERILE IRR 1500ML POUR (IV SOLUTION) ×3 IMPLANT
WRAP KNEE MAXI GEL POST OP (GAUZE/BANDAGES/DRESSINGS) ×3 IMPLANT
YANKAUER SUCT BULB TIP 10FT TU (MISCELLANEOUS) ×3 IMPLANT

## 2015-03-11 NOTE — Anesthesia Postprocedure Evaluation (Signed)
  Anesthesia Post-op Note  Patient: [name]  Procedure(s) Performed: Procedure(s) (LRB): LEFT TOTAL KNEE ARTHROPLASTY (Left)  Patient Location: PACU  Anesthesia Type: General  Level of Consciousness: awake and alert   Airway and Oxygen Therapy: Patient Spontanous Breathing  Post-op Pain: mild  Post-op Assessment: Post-op Vital signs reviewed, Patient's Cardiovascular Status Stable, Respiratory Function Stable, Patent Airway and No signs of Nausea or vomiting  Last Vitals:  Filed Vitals:   03/11/15 1605  BP: 140/62  Pulse: 66  Temp: 36.6 C  Resp: 16    Post-op Vital Signs: stable   Complications: No apparent anesthesia complications

## 2015-03-11 NOTE — Anesthesia Preprocedure Evaluation (Signed)
Anesthesia Evaluation  Patient identified by MRN, date of birth, ID band Patient awake    Reviewed: Allergy & Precautions, H&P , NPO status , Patient's Chart, lab work & pertinent test results  Airway Mallampati: II  TM Distance: >3 FB Neck ROM: Full    Dental  (+) Poor Dentition, Missing Only one tooth left.:   Pulmonary asthma , pneumonia -, COPD COPD inhaler, former smoker,  Oxygen at home, but he hardly ever uses it. breath sounds clear to auscultation  Pulmonary exam normal       Cardiovascular Exercise Tolerance: Good hypertension, Pt. on medications + CAD Normal cardiovascular exam+ dysrhythmias Atrial Fibrillation Rhythm:Regular Rate:Normal  Clearance Dr. Caryl Comes.   Neuro/Psych Anxiety TIA Neuromuscular disease negative psych ROS   GI/Hepatic Neg liver ROS, hiatal hernia, GERD-  Medicated,  Endo/Other  Hyperthyroidism   Renal/GU negative Renal ROS     Musculoskeletal negative musculoskeletal ROS (+)   Abdominal   Peds  Hematology negative hematology ROS (+)   Anesthesia Other Findings   Reproductive/Obstetrics                             Anesthesia Physical  Anesthesia Plan  ASA: III  Anesthesia Plan: General   Post-op Pain Management:    Induction: Intravenous  Airway Management Planned: Oral ETT  Additional Equipment:   Intra-op Plan:   Post-operative Plan: Extubation in OR  Informed Consent: I have reviewed the patients History and Physical, chart, labs and discussed the procedure including the risks, benefits and alternatives for the proposed anesthesia with the patient or authorized representative who has indicated his/her understanding and acceptance.   Dental advisory given  Plan Discussed with: CRNA  Anesthesia Plan Comments:         Anesthesia Quick Evaluation

## 2015-03-11 NOTE — Progress Notes (Signed)
Utilization review completed.  

## 2015-03-11 NOTE — Brief Op Note (Signed)
03/11/2015  12:03 PM  PATIENT:  [name]  80 y.o. male  PRE-OPERATIVE DIAGNOSIS:  left knee Primary  osteoarthritis  POST-OPERATIVE DIAGNOSIS:  left knee Primary  osteoarthritis  PROCEDURE:  Procedure(s): LEFT TOTAL KNEE ARTHROPLASTY (Left)  SURGEON:  Surgeon(s) and Role:    * Latanya Maudlin, MD - Primary  PHYSICIAN ASSISTANT: Ardeen Jourdain PA  ASSISTANTS:Amber Crane PA   ANESTHESIA:   general  EBL:  Total I/O In: 1000 [I.V.:1000] Out: 625 [Urine:625]  BLOOD ADMINISTERED:none  DRAINS: (One) Hemovact drain(s) in the Left Knee with  Suction Open   LOCAL MEDICATIONS USED:  MARCAINE 20cc of 0.25% plain and 20cc of Exparel mixed with 20cc of Normal Saline.    SPECIMEN:  No Specimen  DISPOSITION OF SPECIMEN:  N/A  COUNTS:  YES  TOURNIQUET:  * Missing tourniquet times found for documented tourniquets in log:  [id number] *  DICTATION: .Other Dictation: Dictation Number [id number]  PLAN OF CARE: Admit to inpatient   PATIENT DISPOSITION:  Stable in OR   Delay start of Pharmacological VTE agent (>24hrs) due to surgical blood loss or risk of bleeding: yes

## 2015-03-11 NOTE — Progress Notes (Signed)
ANTICOAGULATION CONSULT NOTE - Initial Consult  Pharmacy Consult for Warfarin Indication: atrial fibrillation, VTE prophylaxis  No Known Allergies  Patient Measurements: Height: 5\' 11"  (180.3 cm) Weight: 190 lb (86.183 kg) IBW/kg (Calculated) : 75.3  Vital Signs: Temp: 98 F (36.7 C) (06/29 1505) Temp Source: Oral (06/29 1505) BP: 139/69 mmHg (06/29 1505) Pulse Rate: 74 (06/29 1505)  Labs:  Recent Labs  03/11/15 0830  LABPROT 14.0  INR 1.06    Estimated Creatinine Clearance: 72.4 mL/min (by C-G formula based on Cr of 0.91).   Medical History: Past Medical History  Diagnosis Date  . Paroxysmal atrial fibrillation     coumadin therapy;  echo 1/10: EF 65-70%, mild diast dysfxn  . Hyperlipidemia   . GERD (gastroesophageal reflux disease)   . Allergic rhinitis   . Asthma   . Anxiety   . Sebaceous cyst     scalp  . SVT (supraventricular tachycardia)     s/p RFCA 05/2010  . History of TIAs   . Hiatal hernia   . COPD (chronic obstructive pulmonary disease)     admitted 5/11, uses oxygen 2 liters/min prn rare use  . Tubular adenoma 2014    Dr. Hilarie Fredrickson  . Hypertension   . First degree AV block   . CAD (coronary artery disease)     cath 7/09: pCFX 25%, mRCA 25%, EF 60%;  Myoview 6/11: low risk, EF 67%  . Heart murmur   . Shortness of breath dyspnea     with exertion   . Pneumonia     hx of   . Arthritis     Assessment: 89 yoM on warfarin PTA for atrial fibrillation s/p left TKA 6/29.  Pharmacy consulted to resume warfarin post-op for VTE prophylaxis.  Patient reports that his home warfarin regimen is to take 3 mg one day then 4 mg daily for the next two days then repeat the cycle.  INR 1.06 CBC WNL on 6/22  Goal of Therapy:  INR 2-3   Plan:  Warfarin 6 mg po once tonight. Daily INR.  Hershal Coria 03/11/2015,3:49 PM

## 2015-03-11 NOTE — Interval H&P Note (Signed)
History and Physical Interval Note:  03/11/2015 10:07 AM  [name]  has presented today for surgery, with the diagnosis of left knee osteoarthritis  The various methods of treatment have been discussed with the patient and family. After consideration of risks, benefits and other options for treatment, the patient has consented to  Procedure(s): LEFT TOTAL KNEE ARTHROPLASTY (Left) as a surgical intervention .  The patient's history has been reviewed, patient examined, no change in status, stable for surgery.  I have reviewed the patient's chart and labs.  Questions were answered to the patient's satisfaction.     Odessia Asleson A

## 2015-03-11 NOTE — Anesthesia Procedure Notes (Signed)
Procedure Name: Intubation Date/Time: 03/11/2015 10:20 AM Performed by: Lajuana Carry E Pre-anesthesia Checklist: Patient identified, Emergency Drugs available, Suction available and Patient being monitored Patient Re-evaluated:Patient Re-evaluated prior to inductionOxygen Delivery Method: Circle System Utilized Preoxygenation: Pre-oxygenation with 100% oxygen Intubation Type: IV induction Ventilation: Mask ventilation without difficulty Laryngoscope Size: Mac and 3 Grade View: Grade I Tube type: Oral Tube size: 7.5 mm Number of attempts: 1 Placement Confirmation: ETT inserted through vocal cords under direct vision,  positive ETCO2 and breath sounds checked- equal and bilateral Secured at: 21 cm Tube secured with: Tape Dental Injury: Teeth and Oropharynx as per pre-operative assessment

## 2015-03-11 NOTE — Transfer of Care (Signed)
Immediate Anesthesia Transfer of Care Note  Patient: [name]  Procedure(s) Performed: Procedure(s): LEFT TOTAL KNEE ARTHROPLASTY (Left)  Patient Location: PACU  Anesthesia Type:General  Level of Consciousness:  sedated, patient cooperative and responds to stimulation  Airway & Oxygen Therapy:Patient Spontanous Breathing and Patient connected to face mask oxgen  Post-op Assessment:  Report given to PACU RN and Post -op Vital signs reviewed and stable  Post vital signs:  Reviewed and stable  Last Vitals:  Filed Vitals:   03/11/15 0743  BP: 127/84  Pulse: 71  Temp: 36.4 C  Resp: 18    Complications: No apparent anesthesia complications

## 2015-03-12 ENCOUNTER — Encounter (HOSPITAL_COMMUNITY): Payer: Self-pay | Admitting: Orthopedic Surgery

## 2015-03-12 LAB — CBC
HCT: 36.5 % — ABNORMAL LOW (ref 39.0–52.0)
HEMOGLOBIN: 12 g/dL — AB (ref 13.0–17.0)
MCH: 29.6 pg (ref 26.0–34.0)
MCHC: 32.9 g/dL (ref 30.0–36.0)
MCV: 89.9 fL (ref 78.0–100.0)
PLATELETS: 180 10*3/uL (ref 150–400)
RBC: 4.06 MIL/uL — ABNORMAL LOW (ref 4.22–5.81)
RDW: 13.4 % (ref 11.5–15.5)
WBC: 10.4 10*3/uL (ref 4.0–10.5)

## 2015-03-12 LAB — BASIC METABOLIC PANEL
Anion gap: 7 (ref 5–15)
BUN: 21 mg/dL — ABNORMAL HIGH (ref 6–20)
CALCIUM: 9 mg/dL (ref 8.9–10.3)
CO2: 28 mmol/L (ref 22–32)
Chloride: 102 mmol/L (ref 101–111)
Creatinine, Ser: 1.01 mg/dL (ref 0.61–1.24)
GFR calc Af Amer: >60 mL/min (ref >60)
GFR calc non Af Amer: >60 mL/min (ref >60)
Glucose, Bld: 136 mg/dL — ABNORMAL HIGH (ref 65–99)
POTASSIUM: 4.8 mmol/L (ref 3.5–5.1)
SODIUM: 137 mmol/L (ref 135–145)

## 2015-03-12 LAB — PROTIME-INR
INR: 1.05 (ref 0.00–1.49)
Prothrombin Time: 13.9 seconds (ref 11.6–15.2)

## 2015-03-12 MED ORDER — WARFARIN SODIUM 5 MG PO TABS
5.0000 mg | ORAL_TABLET | Freq: Once | ORAL | Status: AC
  Administered 2015-03-12: 5 mg via ORAL
  Filled 2015-03-12: qty 1

## 2015-03-12 NOTE — Progress Notes (Signed)
Subjective: 1 Day Post-Op Procedure(s) (LRB): LEFT TOTAL KNEE ARTHROPLASTY (Left) Patient reports pain as 2 on 0-10 scale.Hemovac DCd and no Post-Op problems except nausea.    Objective: Vital signs in last 24 hours: Temp:  [97.5 F (36.4 C)-98.2 F (36.8 C)] 98.2 F (36.8 C) (06/30 0537) Pulse Rate:  [66-88] 84 (06/30 0537) Resp:  [12-24] 14 (06/30 0537) BP: (127-165)/(56-89) 148/63 mmHg (06/30 0537) SpO2:  [96 %-100 %] 98 % (06/30 0537) Weight:  [86.183 kg (190 lb)] 86.183 kg (190 lb) (06/29 1505)  Intake/Output from previous day: 06/29 0701 - 06/30 0700 In: 3695 [P.O.:840; I.V.:2750; IV Piggyback:105] Out: 3270 [Urine:2875; Drains:395] Intake/Output this shift:     Recent Labs  03/12/15 0504  HGB 12.0*    Recent Labs  03/12/15 0504  WBC 10.4  RBC 4.06*  HCT 36.5*  PLT 180    Recent Labs  03/12/15 0504  NA 137  K 4.8  CL 102  CO2 28  BUN 21*  CREATININE 1.01  GLUCOSE 136*  CALCIUM 9.0    Recent Labs  03/11/15 0830 03/12/15 0504  INR 1.06 1.05    Neurologically intact Dorsiflexion/Plantar flexion intact Compartment soft  Assessment/Plan: 1 Day Post-Op Procedure(s) (LRB): LEFT TOTAL KNEE ARTHROPLASTY (Left) Up with therapy  Ernestene Coover A 03/12/2015, 7:01 AM

## 2015-03-12 NOTE — Evaluation (Signed)
Occupational Therapy Evaluation Patient Details Name: ELIJIAH MICKLEY MRN: [id number] DOB: 1935/05/17 Today's Date: 03/12/2015    History of Present Illness s/p L TKA, h/o R TKA   Clinical Impression   This 80 year old man was admitted for the above surgery.  All education was completed.  No further OT is needed at this time.      Follow Up Recommendations  Supervision/Assistance - 24 hour;No OT follow up    Equipment Recommendations  None recommended by OT    Recommendations for Other Services       Precautions / Restrictions Precautions Precautions: Knee Required Braces or Orthoses: Knee Immobilizer - Left Restrictions Weight Bearing Restrictions: No      Mobility Bed Mobility               General bed mobility comments: oob  Transfers Overall transfer level: Needs assistance Equipment used: Rolling walker (2 wheeled) Transfers: Sit to/from Stand Sit to Stand: Min guard         General transfer comment: for safety    Balance                                            ADL Overall ADL's : Needs assistance/impaired     Grooming: Standing;Min guard                   Toilet Transfer: Min guard;Ambulation;BSC   Toileting- Water quality scientist and Hygiene: Min guard;Sit to/from stand         General ADL Comments: wife present and plans to assist with ADLs.  She cued pt to wait.  Pt needs set up for UB, mod A for LB bathing and max A for LB dressing.  Pt ambulated to bathroom with min guard:  min cues given for safety.  at one point, pt lifted RW to negotiate around obstacle--cued to keep all four points on floor and sidestep.  Also cued for moving RW forward a little more in order to step between hands and not past wheels.  Pt/wife verbalize understanding.  Pt plans to sponge bathe initallyl     Vision     Perception     Praxis      Pertinent Vitals/Pain Pain Assessment: 0-10 Pain Score: 2  Pain Location: R  knee Pain Descriptors / Indicators: Sore Pain Intervention(s): Limited activity within patient's tolerance;Monitored during session;Premedicated before session;Repositioned;Ice applied     Hand Dominance     Extremity/Trunk Assessment Upper Extremity Assessment Upper Extremity Assessment: Overall WFL for tasks assessed           Communication Communication Communication: No difficulties   Cognition Arousal/Alertness: Awake/alert Behavior During Therapy: WFL for tasks assessed/performed Overall Cognitive Status: Within Functional Limits for tasks assessed (needs cues for safety:  impulsive at times, may be personali)                     General Comments       Exercises       Shoulder Instructions      Home Living Family/patient expects to be discharged to:: Private residence Living Arrangements: Spouse/significant other Available Help at Discharge: Family               Bathroom Shower/Tub: Tub/shower unit Shower/tub characteristics: Architectural technologist: Standard     Home Equipment: Bedside commode  Prior Functioning/Environment Level of Independence: Independent             OT Diagnosis: Generalized weakness   OT Problem List:     OT Treatment/Interventions:      OT Goals(Current goals can be found in the care plan section) Acute Rehab OT Goals Patient Stated Goal: return to being independent  OT Frequency:     Barriers to D/C:            Co-evaluation              End of Session    Activity Tolerance: Patient tolerated treatment well Patient left: in chair;with call bell/phone within reach;with family/visitor present   Time: 0459-9774 OT Time Calculation (min): 19 min Charges:  OT General Charges $OT Visit: 1 Procedure OT Evaluation $Initial OT Evaluation Tier I: 1 Procedure G-Codes:    Malita Ignasiak 2015/04/03, 12:12 PM  Lesle Chris, OTR/L [phone] 04/03/15

## 2015-03-12 NOTE — Care Management Note (Signed)
Case Management Note  Patient Details  Name: KUE FOX MRN: [id number] Date of Birth: 1934/11/22  Subjective/Objective:                   LEFT TOTAL KNEE ARTHROPLASTY (Left) Action/Plan:  Discharge planning Expected Discharge Date:  03/13/15               Expected Discharge Plan:  [place]  In-House Referral:     Discharge planning Services  CM Consult  Post Acute Care Choice:  Home Health Choice offered to:     DME Arranged:    DME Agency:     HH Arranged:  PT [place]:  [place]  Status of Service:  Completed, signed off  Medicare Important Message Given:    Date Medicare IM Given:    Medicare IM give by:    Date Additional Medicare IM Given:    Additional Medicare Important Message give by:     If discussed at [place] of Stay Meetings, dates discussed:    Additional Comments: CM met with pt in room to offer choice of home health agency.  Pt chooses [facility] to render HHPT.  Address and contact information verified by pt.  Referral called to [facility] rep, Kristen with request for [place].  Pt has both 3n1 and rolling walker at home.  No other CM needs were communicated. Dellie Catholic, RN 03/12/2015, 2:45 PM

## 2015-03-12 NOTE — Progress Notes (Signed)
ANTICOAGULATION CONSULT NOTE - Follow up  Pharmacy Consult for Warfarin Indication: atrial fibrillation, VTE prophylaxis  No Known Allergies  Patient Measurements: Height: 5\' 11"  (180.3 cm) Weight: 190 lb (86.183 kg) IBW/kg (Calculated) : 75.3  Vital Signs: Temp: 98.2 F (36.8 C) (06/30 0537) Temp Source: Oral (06/30 0537) BP: 148/63 mmHg (06/30 0537) Pulse Rate: 84 (06/30 0537)  Labs:  Recent Labs  03/11/15 0830 03/12/15 0504  HGB  --  12.0*  HCT  --  36.5*  PLT  --  180  LABPROT 14.0 13.9  INR 1.06 1.05  CREATININE  --  1.01    Estimated Creatinine Clearance: 65.2 mL/min (by C-G formula based on Cr of 1.01).   Assessment: 42 yoM on warfarin PTA for atrial fibrillation s/p left TKA 6/29.  Pharmacy consulted to resume warfarin post-op for VTE prophylaxis.  Patient reports that his home warfarin regimen is to take 3mg  one day then 4mg  daily for the next two days then repeat the cycle.  Significant events: 6/29: Surgery. INR 1.06. Coumadin 6mg .  Today, 03/12/2015: INR not rising yet.  No bridging anticoag. SCDs in place. CBC ok. No bleeding reported/documented. Tolerating diet this am.  Goal of Therapy:  INR 2-3   Plan:  Give Coumadin 5mg  tonight. Daily INR.  Romeo Rabon, PharmD, pager [phone]. 03/12/2015,10:23 AM.

## 2015-03-12 NOTE — Progress Notes (Signed)
Physical Therapy Treatment Patient Details Name: [name] MRN: [id number] DOB: March 18, 1935 Today's Date: 03/12/2015    History of Present Illness s/p L TKA, h/o R TKA    PT Comments    Pt progressing well with mobility and hopeful for dc tomorrow.  Follow Up Recommendations  Home health PT     Equipment Recommendations  None recommended by PT    Recommendations for Other Services OT consult     Precautions / Restrictions Precautions Precautions: Knee Required Braces or Orthoses: Knee Immobilizer - Left Knee Immobilizer - Left: Discontinue once straight leg raise with < 10 degree lag Restrictions Weight Bearing Restrictions: No Other Position/Activity Restrictions: WBAT    Mobility  Bed Mobility Overal bed mobility: Needs Assistance Bed Mobility: Sit to Supine;Supine to Sit     Supine to sit: Min guard Sit to supine: Min guard   General bed mobility comments: pt self assisting L LE with R LE  Transfers Overall transfer level: Needs assistance Equipment used: Rolling walker (2 wheeled) Transfers: Sit to/from Stand Sit to Stand: Min guard         General transfer comment: for safety  Ambulation/Gait Ambulation/Gait assistance: Min assist;Min guard Ambulation Distance (Feet): 100 Feet Assistive device: Rolling walker (2 wheeled) Gait Pattern/deviations: Step-to pattern;Step-through pattern;Shuffle;Trunk flexed     General Gait Details: cues for posture, position from RW and initial sequence   Stairs            Wheelchair Mobility    Modified Rankin (Stroke Patients Only)       Balance                                    Cognition Arousal/Alertness: Awake/alert Behavior During Therapy: WFL for tasks assessed/performed Overall Cognitive Status: Within Functional Limits for tasks assessed                      Exercises Total Joint Exercises Ankle Circles/Pumps: AROM;Both;15 reps;Supine Quad Sets:  AROM;Both;10 reps;Supine Heel Slides: AAROM;Left;15 reps;Supine Straight Leg Raises: AAROM;Left;15 reps;Supine    General Comments        Pertinent Vitals/Pain Pain Assessment: 0-10 Pain Score: 4  Pain Location: R knee Pain Descriptors / Indicators: Aching;Sore Pain Intervention(s): Limited activity within patient's tolerance;Monitored during session;Premedicated before session;Ice applied    Home Living                      Prior Function            PT Goals (current goals can now be found in the care plan section) Acute Rehab PT Goals Patient Stated Goal: return to being independent PT Goal Formulation: With patient Time For Goal Achievement: 03/18/15 Potential to Achieve Goals: Good Progress towards PT goals: Progressing toward goals    Frequency  7X/week    PT Plan Current plan remains appropriate    Co-evaluation             End of Session Equipment Utilized During Treatment: Gait belt;Left knee immobilizer Activity Tolerance: Patient tolerated treatment well Patient left: in bed;with call bell/phone within reach;with family/visitor present     Time: 1531-1605 PT Time Calculation (min) (ACUTE ONLY): 34 min  Charges:  $Gait Training: 8-22 mins $Therapeutic Exercise: 8-22 mins                    G Codes:  Ayiana Winslett 03/12/2015, 5:19 PM

## 2015-03-12 NOTE — Op Note (Signed)
[name], [name] NO.:  [id number]  MEDICAL RECORD NO.:  [id number]  LOCATION:  67                         FACILITY:  [facility]  PHYSICIAN:  Kipp Brood. Jeffry Vogelsang, M.D.DATE OF BIRTH:  November 17, 1934  DATE OF PROCEDURE:  03/11/2015 DATE OF DISCHARGE:                              OPERATIVE REPORT   SURGEON:  Kipp Brood. Gladstone Lighter, M.D.  ASSISTANT:  Ardeen Jourdain, [place].  PREOPERATIVE DIAGNOSIS:  Primary osteoarthritis with bone-on-bone, left knee.  POSTOPERATIVE DIAGNOSIS:  Primary osteoarthritis with bone-on-bone, left knee.  OPERATION:  Left total knee replacement utilizing the DePuy system, all three components were cemented.  The sizes used were as follows:  Size 4 left femoral component, posterior cruciate sacrificing type.  The tray was a size 40, insert was a size 4, 12.5 mm thickness.  The patella was a size 41 with 3 pegs.  Gentamicin was used in the cement.  DESCRIPTION OF PROCEDURE:  After sterile prep and draping was carried out, the appropriate time-out was carried out.  I also marked the appropriate left leg in the holding area.  At this time, the leg was exsanguinated with an Esmarch, tourniquet was elevated to 325 mmHg.  The leg was placed in a DeMayo knee holder and flexed.  At this time, an incision was made over the anterior aspect of the left knee.  Bleeders were identified and cauterized, two flaps were created.  Following that, I did a median parapatellar incision in usual fashion.  I reflected the patella laterally, flexed the knee and did medial and lateral meniscectomies and excised the anterior and posterior cruciate ligaments.  Following that, the initial drill hole was made in the intercondylar notch in usual fashion.  I then inserted the canal finder to make sure we were in the canal.  We thoroughly irrigated out the canal with antibiotic solution.  Following that, we inserted our first jig and removed 11-mm thickness off the distal femur.   At that time, we then measured the femur to be a size 4, left.  We then inserted our next jig and did anterior, posterior and chamfering cuts for a size 4 left femoral component.  After that, we prepared the tibia in the usual fashion.  A drill hole was made in the tibial plateau.  We measured the tibia to be a size 4.  The appropriate amount of bone 6 mm in thickness was removed from the affected medial side.  After this, we then inserted our laminar spreaders.  We removed posterior spurs in the femur and completed our meniscectomies.  Following that, we then inserted our spacer blocks and elected to use a 12.5 mm spacer blocks.  We then removed the spacer blocks, thoroughly cleaned out the knee and then continued to prepare the tibia.  We cut our keel cut and the tibia in usual fashion, then the notch cut out of the distal femur.  We then inserted our trial components and then did a resurfacing procedure on the patella for a size 41 patella.  Three drill holes were made in the articular surface of the patella.  All three components were removed. We thoroughly water picked out the knee, cemented all three components  in simultaneously.  We then injected 20 mL of 0.25% plain Marcaine. After the cement was hardened, we then removed the tibial trial insert, which was 12.5 mm thickness and removed the loose pieces of cement.  We water picked out the knee to make sure all loose pieces were removed. At that time, we then inserted our permanent spacer block size 4, 12.5- mm thickness.  We reduced the knee and had excellent function.  We then inserted a Hemovac drain and closed the wound in layers in usual fashion.  Skin was closed with subcu closure with running locking Monocryl suture.  Sterile dressings were applied.  The patient had 2 g of IV Ancef preop.  We used tranexamic acid topically.          ______________________________ Kipp Brood Gladstone Lighter, M.D.     RAG/MEDQ  D:  03/11/2015   T:  03/12/2015  Job:  [id number]

## 2015-03-12 NOTE — Evaluation (Signed)
Physical Therapy Evaluation Patient Details Name: [name] MRN: [id number] DOB: 1935/06/02 Today's Date: 03/12/2015   History of Present Illness  s/p L TKA, h/o R TKA  Clinical Impression  Pt s/p L TKR presents with decreased L LE strength/ROM and post op pain limiting functional mobility.  Pt should progress well to dc home with family assist and HHPT follow up.    Follow Up Recommendations Home health PT    Equipment Recommendations  None recommended by PT    Recommendations for Other Services OT consult     Precautions / Restrictions Precautions Precautions: Knee Required Braces or Orthoses: Knee Immobilizer - Left Knee Immobilizer - Left: Discontinue once straight leg raise with < 10 degree lag Restrictions Weight Bearing Restrictions: No Other Position/Activity Restrictions: WBAT      Mobility  Bed Mobility Overal bed mobility: Needs Assistance Bed Mobility: Supine to Sit     Supine to sit: Min assist     General bed mobility comments: oob  Transfers Overall transfer level: Needs assistance Equipment used: Rolling walker (2 wheeled) Transfers: Sit to/from Stand Sit to Stand: Min guard         General transfer comment: for safety  Ambulation/Gait Ambulation/Gait assistance: Min assist;Min guard Ambulation Distance (Feet): 95 Feet Assistive device: Rolling walker (2 wheeled) Gait Pattern/deviations: Step-to pattern;Step-through pattern;Decreased step length - right;Decreased step length - left;Shuffle;Trunk flexed     General Gait Details: cues for posture, position from RW and initial sequence  Stairs            Wheelchair Mobility    Modified Rankin (Stroke Patients Only)       Balance                                             Pertinent Vitals/Pain Pain Assessment: 0-10 Pain Score: 2  Pain Location: R knee Pain Descriptors / Indicators: Sore Pain Intervention(s): Limited activity within patient's  tolerance;Monitored during session;Premedicated before session;Repositioned;Ice applied    Home Living Family/patient expects to be discharged to:: Private residence Living Arrangements: Spouse/significant other Available Help at Discharge: Family Type of Home: House Home Access: Level entry     Home Layout: One level Home Equipment: Bedside commode      Prior Function Level of Independence: Independent               Hand Dominance        Extremity/Trunk Assessment   Upper Extremity Assessment: Overall WFL for tasks assessed           Lower Extremity Assessment: LLE deficits/detail   LLE Deficits / Details: 2+/5 quads with AAROM at knee -15 - 80  Cervical / Trunk Assessment: Normal  Communication   Communication: No difficulties  Cognition Arousal/Alertness: Awake/alert Behavior During Therapy: WFL for tasks assessed/performed Overall Cognitive Status: Within Functional Limits for tasks assessed (needs cues for safety:  impulsive at times, may be personali)                      General Comments      Exercises Total Joint Exercises Ankle Circles/Pumps: AROM;Both;15 reps;Supine Quad Sets: AROM;Both;10 reps;Supine Heel Slides: AAROM;Left;15 reps;Supine Straight Leg Raises: AAROM;Left;15 reps;Supine      Assessment/Plan    PT Assessment Patient needs continued PT services  PT Diagnosis Difficulty walking   PT Problem List Decreased strength;Decreased range of  motion;Decreased activity tolerance;Decreased mobility;Decreased knowledge of use of DME;Pain  PT Treatment Interventions DME instruction;Gait training;Stair training;Functional mobility training;Therapeutic activities;Therapeutic exercise;Patient/family education   PT Goals (Current goals can be found in the Care Plan section) Acute Rehab PT Goals Patient Stated Goal: return to being independent PT Goal Formulation: With patient Time For Goal Achievement: 03/18/15 Potential to Achieve  Goals: Good    Frequency 7X/week   Barriers to discharge        Co-evaluation               End of Session Equipment Utilized During Treatment: Gait belt;Left knee immobilizer Activity Tolerance: Patient tolerated treatment well Patient left: in chair;with call bell/phone within reach;with family/visitor present Nurse Communication: Mobility status         Time: 7615-1834 PT Time Calculation (min) (ACUTE ONLY): 34 min   Charges:   PT Evaluation $Initial PT Evaluation Tier I: 1 Procedure PT Treatments $Therapeutic Exercise: 8-22 mins   PT G Codes:        Allyse Fregeau 04-03-15, 12:28 PM

## 2015-03-13 LAB — CBC
HEMATOCRIT: 37.5 % — AB (ref 39.0–52.0)
Hemoglobin: 12.3 g/dL — ABNORMAL LOW (ref 13.0–17.0)
MCH: 29.9 pg (ref 26.0–34.0)
MCHC: 32.8 g/dL (ref 30.0–36.0)
MCV: 91 fL (ref 78.0–100.0)
Platelets: 191 10*3/uL (ref 150–400)
RBC: 4.12 MIL/uL — ABNORMAL LOW (ref 4.22–5.81)
RDW: 13.8 % (ref 11.5–15.5)
WBC: 10 10*3/uL (ref 4.0–10.5)

## 2015-03-13 LAB — PROTIME-INR
INR: 1.46 (ref 0.00–1.49)
Prothrombin Time: 17.8 seconds — ABNORMAL HIGH (ref 11.6–15.2)

## 2015-03-13 LAB — BASIC METABOLIC PANEL
Anion gap: 6 (ref 5–15)
BUN: 22 mg/dL — AB (ref 6–20)
CO2: 31 mmol/L (ref 22–32)
Calcium: 9 mg/dL (ref 8.9–10.3)
Chloride: 102 mmol/L (ref 101–111)
Creatinine, Ser: 0.84 mg/dL (ref 0.61–1.24)
GFR calc Af Amer: >60 mL/min (ref >60)
GLUCOSE: 96 mg/dL (ref 65–99)
Potassium: 4.4 mmol/L (ref 3.5–5.1)
SODIUM: 139 mmol/L (ref 135–145)

## 2015-03-13 MED ORDER — WARFARIN SODIUM 5 MG PO TABS: 5.0000 mg | ORAL_TABLET | Freq: Every day | ORAL | Status: DC

## 2015-03-13 MED ORDER — OXYCODONE-ACETAMINOPHEN 5-325 MG PO TABS: 1.0000 | ORAL_TABLET | ORAL | Status: DC | PRN

## 2015-03-13 MED ORDER — TIZANIDINE HCL 4 MG PO TABS: 4.0000 mg | ORAL_TABLET | Freq: Three times a day (TID) | ORAL | Status: DC | PRN

## 2015-03-13 NOTE — Progress Notes (Signed)
Physical Therapy Treatment Patient Details Name: EMILEO SEMEL MRN: [id number] DOB: [birth year] Today's Date: 2015/03/23    History of Present Illness s/p L TKA, h/o R TKA    PT Comments    Progressing well with mobility.  Reviewed home therex and don/doff KI with pt and spouse.  Follow Up Recommendations  Home health PT     Equipment Recommendations  None recommended by PT    Recommendations for Other Services OT consult     Precautions / Restrictions Precautions Precautions: Knee Required Braces or Orthoses: Knee Immobilizer - Left Knee Immobilizer - Left: Discontinue once straight leg raise with < 10 degree lag Restrictions Weight Bearing Restrictions: No Other Position/Activity Restrictions: WBAT    Mobility  Bed Mobility                  Transfers Overall transfer level: Needs assistance Equipment used: Rolling walker (2 wheeled) Transfers: Sit to/from Stand Sit to Stand: Supervision         General transfer comment: for safety  Ambulation/Gait Ambulation/Gait assistance: Min guard;Supervision Ambulation Distance (Feet): 150 Feet Assistive device: Rolling walker (2 wheeled) Gait Pattern/deviations: Step-to pattern;Step-through pattern;Shuffle;Trunk flexed     General Gait Details: min cues for posture and position from [employer]            Wheelchair Mobility    Modified Rankin (Stroke Patients Only)       Balance                                    Cognition Arousal/Alertness: Awake/alert Behavior During Therapy: WFL for tasks assessed/performed Overall Cognitive Status: Within Functional Limits for tasks assessed                      Exercises Total Joint Exercises Ankle Circles/Pumps: AROM;Both;15 reps;Supine Quad Sets: AROM;Both;Supine;20 reps Heel Slides: AAROM;Left;Supine;20 reps Straight Leg Raises: AAROM;Left;Supine;20 reps Goniometric ROM: AAROM L knee -12 - 70    General Comments         Pertinent Vitals/Pain Pain Assessment: 0-10 Pain Score: 5  Pain Location: R knee Pain Descriptors / Indicators: Aching;Sore Pain Intervention(s): Limited activity within patient's tolerance;Premedicated before session;Monitored during session;Ice applied    Home Living                      Prior Function            PT Goals (current goals can now be found in the care plan section) Acute Rehab PT Goals Patient Stated Goal: return to being independent PT Goal Formulation: With patient Time For Goal Achievement: 03/18/15 Potential to Achieve Goals: Good Progress towards PT goals: Progressing toward goals    Frequency  7X/week    PT Plan Current plan remains appropriate    Co-evaluation             End of Session Equipment Utilized During Treatment: Gait belt;Left knee immobilizer Activity Tolerance: Patient tolerated treatment well Patient left: with call bell/phone within reach;with family/visitor present;in chair     Time: 1324-4010 PT Time Calculation (min) (ACUTE ONLY): 37 min  Charges:  $Gait Training: 8-22 mins $Therapeutic Exercise: 8-22 mins                    G Codes:      Katelynne Revak 03-23-15, 8:58 AM

## 2015-03-13 NOTE — Progress Notes (Signed)
   Subjective: 2 Days Post-Op Procedure(s) (LRB): LEFT TOTAL KNEE ARTHROPLASTY (Left) Patient reports pain as mild.   Patient seen in rounds for Dr. Gladstone Lighter. Patient is well, and has had no acute complaints or problems. No issues overnight. No SOB or chest pain. Voiding well. Positive flatus.    Objective: Vital signs in last 24 hours: Temp:  [97.7 F (36.5 C)-98.7 F (37.1 C)] 97.7 F (36.5 C) (07/01 0605) Pulse Rate:  [70-86] 70 (07/01 0605) Resp:  [16-18] 18 (07/01 0605) BP: (133-148)/(50-69) 148/69 mmHg (07/01 0605) SpO2:  [95 %-97 %] 97 % (07/01 0605)  Intake/Output from previous day:  Intake/Output Summary (Last 24 hours) at 03/13/15 0743 Last data filed at 03/12/15 1708  Gross per 24 hour  Intake    240 ml  Output    752 ml  Net   -512 ml     Labs:  Recent Labs  03/12/15 0504 03/13/15 0524  HGB 12.0* 12.3*    Recent Labs  03/12/15 0504 03/13/15 0524  WBC 10.4 10.0  RBC 4.06* 4.12*  HCT 36.5* 37.5*  PLT 180 191    Recent Labs  03/12/15 0504 03/13/15 0524  NA 137 139  K 4.8 4.4  CL 102 102  CO2 28 31  BUN 21* 22*  CREATININE 1.01 0.84  GLUCOSE 136* 96  CALCIUM 9.0 9.0    Recent Labs  03/12/15 0504 03/13/15 0524  INR 1.05 1.46    EXAM General - Patient is Alert and Oriented Extremity - Neurologically intact Neurovascular intact Dorsiflexion/Plantar flexion intact No cellulitis present Compartment soft Dressing/Incision - clean, dry, no drainage Motor Function - intact, moving foot and toes well on exam.   Past Medical History  Diagnosis Date  . Paroxysmal atrial fibrillation     coumadin therapy;  echo 1/10: EF 65-70%, mild diast dysfxn  . Hyperlipidemia   . GERD (gastroesophageal reflux disease)   . Allergic rhinitis   . Asthma   . Anxiety   . Sebaceous cyst     scalp  . SVT (supraventricular tachycardia)     s/p RFCA 05/2010  . History of TIAs   . Hiatal hernia   . COPD (chronic obstructive pulmonary disease)    admitted 5/11, uses oxygen 2 liters/min prn rare use  . Tubular adenoma 2014    Dr. Hilarie Fredrickson  . Hypertension   . First degree AV block   . CAD (coronary artery disease)     cath 7/09: pCFX 25%, mRCA 25%, EF 60%;  Myoview 6/11: low risk, EF 67%  . Heart murmur   . Shortness of breath dyspnea     with exertion   . Pneumonia     hx of   . Arthritis     Assessment/Plan: 2 Days Post-Op Procedure(s) (LRB): LEFT TOTAL KNEE ARTHROPLASTY (Left) Active Problems:   History of total knee arthroplasty  Estimated body mass index is 26.51 kg/(m^2) as calculated from the following:   Height as of this encounter: 5\' 11"  (1.803 m).   Weight as of this encounter: 86.183 kg (190 lb). Advance diet Up with therapy D/C IV fluids Discharge home with home health  DVT Prophylaxis - Coumadin Weight-Bearing as tolerated   Continue with PT today. Plan for DC home today.   Ardeen Jourdain, PA-C Orthopaedic Surgery 03/13/2015, 7:43 AM

## 2015-03-13 NOTE — Discharge Instructions (Signed)
INSTRUCTIONS AFTER JOINT REPLACEMENT  ° °Remove items at home which could result in a fall. This includes throw rugs or furniture in walking pathways °ICE to the affected joint every three hours while awake for 30 minutes at a time, for at least the first 3-5 days, and then as needed for pain and swelling.  Continue to use ice for pain and swelling. You may notice swelling that will progress down to the foot and ankle.  This is normal after surgery.  Elevate your leg when you are not up walking on it.   °Continue to use the breathing machine you got in the hospital (incentive spirometer) which will help keep your temperature down.  It is common for your temperature to cycle up and down following surgery, especially at night when you are not up moving around and exerting yourself.  The breathing machine keeps your lungs expanded and your temperature down. ° ° °DIET:  As you were doing prior to hospitalization, we recommend a well-balanced diet. ° °DRESSING / WOUND CARE / SHOWERING ° °Keep the surgical dressing until follow up.  The dressing is water proof, so you can shower without any extra covering.  IF THE DRESSING FALLS OFF or the wound gets wet inside, change the dressing with sterile gauze.  Please use good hand washing techniques before changing the dressing.  Do not use any lotions or creams on the incision until instructed by your surgeon.   ° °ACTIVITY ° °Increase activity slowly as tolerated, but follow the weight bearing instructions below.   °No driving for 6 weeks or until further direction given by your physician.  You cannot drive while taking narcotics.  °No lifting or carrying greater than 10 lbs. until further directed by your surgeon. °Avoid periods of inactivity such as sitting longer than an hour when not asleep. This helps prevent blood clots.  °You may return to work once you are authorized by your doctor.  ° ° ° °WEIGHT BEARING  ° °Weight bearing as tolerated with assist device (walker, cane,  etc) as directed, use it as long as suggested by your surgeon or therapist, typically at least 4-6 weeks. ° ° °EXERCISES ° °Results after joint replacement surgery are often greatly improved when you follow the exercise, range of motion and muscle strengthening exercises prescribed by your doctor. Safety measures are also important to protect the joint from further injury. Any time any of these exercises cause you to have increased pain or swelling, decrease what you are doing until you are comfortable again and then slowly increase them. If you have problems or questions, call your caregiver or physical therapist for advice.  ° °Rehabilitation is important following a joint replacement. After just a few days of immobilization, the muscles of the leg can become weakened and shrink (atrophy).  These exercises are designed to build up the tone and strength of the thigh and leg muscles and to improve motion. Often times heat used for twenty to thirty minutes before working out will loosen up your tissues and help with improving the range of motion but do not use heat for the first two weeks following surgery (sometimes heat can increase post-operative swelling).  ° °These exercises can be done on a training (exercise) mat, on the floor, on a table or on a bed. Use whatever works the best and is most comfortable for you.    Use music or television while you are exercising so that the exercises are a pleasant break in your   day. This will make your life better with the exercises acting as a break in your routine that you can look forward to.   Perform all exercises about fifteen times, three times per day or as directed.  You should exercise both the operative leg and the other leg as well.   Exercises include:   Quad Sets - Tighten up the muscle on the front of the thigh (Quad) and hold for 5-10 seconds.   Straight Leg Raises - With your knee straight (if you were given a brace, keep it on), lift the leg to 60  degrees, hold for 3 seconds, and slowly lower the leg.  Perform this exercise against resistance later as your leg gets stronger.  Leg Slides: Lying on your back, slowly slide your foot toward your buttocks, bending your knee up off the floor (only go as far as is comfortable). Then slowly slide your foot back down until your leg is flat on the floor again.  Angel Wings: Lying on your back spread your legs to the side as far apart as you can without causing discomfort.  Hamstring Strength:  Lying on your back, push your heel against the floor with your leg straight by tightening up the muscles of your buttocks.  Repeat, but this time bend your knee to a comfortable angle, and push your heel against the floor.  You may put a pillow under the heel to make it more comfortable if necessary.   A rehabilitation program following joint replacement surgery can speed recovery and prevent re-injury in the future due to weakened muscles. Contact your doctor or a physical therapist for more information on knee rehabilitation.    CONSTIPATION  Constipation is defined medically as fewer than three stools per week and severe constipation as less than one stool per week.  Even if you have a regular bowel pattern at home, your normal regimen is likely to be disrupted due to multiple reasons following surgery.  Combination of anesthesia, postoperative narcotics, change in appetite and fluid intake all can affect your bowels.   YOU MUST use at least one of the following options; they are listed in order of increasing strength to get the job done.  They are all available over the counter, and you may need to use some, POSSIBLY even all of these options:    Drink plenty of fluids (prune juice may be helpful) and high fiber foods Colace 100 mg by mouth twice a day  Senokot for constipation as directed and as needed Dulcolax (bisacodyl), take with full glass of water  Miralax (polyethylene glycol) once or twice a day as  needed.  If you have tried all these things and are unable to have a bowel movement in the first 3-4 days after surgery call either your surgeon or your primary doctor.    If you experience loose stools or diarrhea, hold the medications until you stool forms back up.  If your symptoms do not get better within 1 week or if they get worse, check with your doctor.  If you experience "the worst abdominal pain ever" or develop nausea or vomiting, please contact the office immediately for further recommendations for treatment.   ITCHING:  If you experience itching with your medications, try taking only a single pain pill, or even half a pain pill at a time.  You can also use Benadryl over the counter for itching or also to help with sleep.   TED HOSE STOCKINGS:  Use stockings on  both legs until for at least 2 weeks or as directed by physician office. They may be removed at night for sleeping.  MEDICATIONS:  See your medication summary on the After Visit Summary that nursing will review with you.  You may have some home medications which will be placed on hold until you complete the course of blood thinner medication.  It is important for you to complete the blood thinner medication as prescribed.  PRECAUTIONS:  If you experience chest pain or shortness of breath - call 911 immediately for transfer to the hospital emergency department.   If you develop a fever greater that 101 F, purulent drainage from wound, increased redness or drainage from wound, foul odor from the wound/dressing, or calf pain - CONTACT YOUR SURGEON.                                                   FOLLOW-UP APPOINTMENTS:  If you do not already have a post-op appointment, please call the office for an appointment to be seen by your surgeon.  Guidelines for how soon to be seen are listed in your After Visit Summary, but are typically between 1-4 weeks after surgery.  OTHER INSTRUCTIONS:  Will need to get PT/INR checked on Sunday  to make proper adjustments in Coumadin dosage.   MAKE SURE YOU:  Understand these instructions.  Get help right away if you are not doing well or get worse.    Thank you for letting us be a part of your medical care team.  It is a privilege we respect greatly.  We hope these instructions will help you stay on track for a fast and full recovery!

## 2015-03-13 NOTE — Progress Notes (Signed)
Pt ready for discharge. Wife will provide transportation.

## 2015-03-17 NOTE — Discharge Summary (Signed)
Physician Discharge Summary   Patient ID: [name] MRN: [id number] DOB/AGE: December 13, 1934 80 y.o.  Admit date: 03/11/2015 Discharge date: 03/13/2015  Primary Diagnosis: Primary osteoarthritis, left knee   Admission Diagnoses:  Past Medical History  Diagnosis Date  . Paroxysmal atrial fibrillation     coumadin therapy;  echo 1/10: EF 65-70%, mild diast dysfxn  . Hyperlipidemia   . GERD (gastroesophageal reflux disease)   . Allergic rhinitis   . Asthma   . Anxiety   . Sebaceous cyst     scalp  . SVT (supraventricular tachycardia)     s/p RFCA 05/2010  . History of TIAs   . Hiatal hernia   . COPD (chronic obstructive pulmonary disease)     admitted 5/11, uses oxygen 2 liters/min prn rare use  . Tubular adenoma 2014    Dr. Hilarie Fredrickson  . Hypertension   . First degree AV block   . CAD (coronary artery disease)     cath 7/09: pCFX 25%, mRCA 25%, EF 60%;  Myoview 6/11: low risk, EF 67%  . Heart murmur   . Shortness of breath dyspnea     with exertion   . Pneumonia     hx of   . Arthritis    Discharge Diagnoses:   Active Problems:   History of total knee arthroplasty  Estimated body mass index is 26.51 kg/(m^2) as calculated from the following:   Height as of this encounter: '5\' 11"'  (1.803 m).   Weight as of this encounter: 86.183 kg (190 lb).  Procedure:  Procedure(s) (LRB): LEFT TOTAL KNEE ARTHROPLASTY (Left)   Consults: None  HPI: [name], 80 y.o. male, has a history of pain and functional disability in the left knee due to arthritis and has failed non-surgical conservative treatments for greater than 12 weeks to includeNSAID's and/or analgesics, corticosteriod injections, viscosupplementation injections and activity modification. Onset of symptoms was gradual, starting 5 years ago with gradually worsening course since that time. The patient noted no past surgery on the left knee(s). Patient currently rates pain in the left knee(s) at 6 out of 10 with  activity. Patient has night pain, worsening of pain with activity and weight bearing, pain that interferes with activities of daily living, pain with passive range of motion, crepitus and joint swelling. Patient has evidence of periarticular osteophytes and joint space narrowing by imaging studies. There is no active infection.  Laboratory Data: Admission on 03/11/2015, Discharged on 03/13/2015  Component Date Value Ref Range Status  . Prothrombin Time 03/11/2015 14.0  11.6 - 15.2 seconds Final  . INR 03/11/2015 1.06  0.00 - 1.49 Final  . ABO/RH(D) 03/11/2015 O POS   Final  . Antibody Screen 03/11/2015 NEG   Final  . Sample Expiration 03/11/2015 03/14/2015   Final  . Prothrombin Time 03/12/2015 13.9  11.6 - 15.2 seconds Final  . INR 03/12/2015 1.05  0.00 - 1.49 Final  . WBC 03/12/2015 10.4  4.0 - 10.5 K/uL Final  . RBC 03/12/2015 4.06* 4.22 - 5.81 MIL/uL Final  . Hemoglobin 03/12/2015 12.0* 13.0 - 17.0 g/dL Final  . HCT 03/12/2015 36.5* 39.0 - 52.0 % Final  . MCV 03/12/2015 89.9  78.0 - 100.0 fL Final  . MCH 03/12/2015 29.6  26.0 - 34.0 pg Final  . MCHC 03/12/2015 32.9  30.0 - 36.0 g/dL Final  . RDW 03/12/2015 13.4  11.5 - 15.5 % Final  . Platelets 03/12/2015 180  150 - 400 K/uL Final  . Sodium 03/12/2015  137  135 - 145 mmol/L Final  . Potassium 03/12/2015 4.8  3.5 - 5.1 mmol/L Final  . Chloride 03/12/2015 102  101 - 111 mmol/L Final  . CO2 03/12/2015 28  22 - 32 mmol/L Final  . Glucose, Bld 03/12/2015 136* 65 - 99 mg/dL Final  . BUN 03/12/2015 21* 6 - 20 mg/dL Final  . Creatinine, Ser 03/12/2015 1.01  0.61 - 1.24 mg/dL Final  . Calcium 03/12/2015 9.0  8.9 - 10.3 mg/dL Final  . GFR calc non Af Amer 03/12/2015 >60  >60 mL/min Final  . GFR calc Af Amer 03/12/2015 >60  >60 mL/min Final   Comment: (NOTE) The eGFR has been calculated using the CKD EPI equation. This calculation has not been validated in all clinical situations. eGFR's persistently <60 mL/min signify possible Chronic  Kidney Disease.   . Anion gap 03/12/2015 7  5 - 15 Final  . Prothrombin Time 03/13/2015 17.8* 11.6 - 15.2 seconds Final  . INR 03/13/2015 1.46  0.00 - 1.49 Final  . WBC 03/13/2015 10.0  4.0 - 10.5 K/uL Final  . RBC 03/13/2015 4.12* 4.22 - 5.81 MIL/uL Final  . Hemoglobin 03/13/2015 12.3* 13.0 - 17.0 g/dL Final  . HCT 03/13/2015 37.5* 39.0 - 52.0 % Final  . MCV 03/13/2015 91.0  78.0 - 100.0 fL Final  . MCH 03/13/2015 29.9  26.0 - 34.0 pg Final  . MCHC 03/13/2015 32.8  30.0 - 36.0 g/dL Final  . RDW 03/13/2015 13.8  11.5 - 15.5 % Final  . Platelets 03/13/2015 191  150 - 400 K/uL Final  . Sodium 03/13/2015 139  135 - 145 mmol/L Final  . Potassium 03/13/2015 4.4  3.5 - 5.1 mmol/L Final  . Chloride 03/13/2015 102  101 - 111 mmol/L Final  . CO2 03/13/2015 31  22 - 32 mmol/L Final  . Glucose, Bld 03/13/2015 96  65 - 99 mg/dL Final  . BUN 03/13/2015 22* 6 - 20 mg/dL Final  . Creatinine, Ser 03/13/2015 0.84  0.61 - 1.24 mg/dL Final  . Calcium 03/13/2015 9.0  8.9 - 10.3 mg/dL Final  . GFR calc non Af Amer 03/13/2015 >60  >60 mL/min Final  . GFR calc Af Amer 03/13/2015 >60  >60 mL/min Final   Comment: (NOTE) The eGFR has been calculated using the CKD EPI equation. This calculation has not been validated in all clinical situations. eGFR's persistently <60 mL/min signify possible Chronic Kidney Disease.   Georgiann Hahn gap 03/13/2015 6  5 - 15 Final  Hospital Outpatient Visit on 03/04/2015  Component Date Value Ref Range Status  . WBC 03/04/2015 8.7  4.0 - 10.5 K/uL Final  . RBC 03/04/2015 4.87  4.22 - 5.81 MIL/uL Final  . Hemoglobin 03/04/2015 14.2  13.0 - 17.0 g/dL Final  . HCT 03/04/2015 43.4  39.0 - 52.0 % Final  . MCV 03/04/2015 89.1  78.0 - 100.0 fL Final  . MCH 03/04/2015 29.2  26.0 - 34.0 pg Final  . MCHC 03/04/2015 32.7  30.0 - 36.0 g/dL Final  . RDW 03/04/2015 13.0  11.5 - 15.5 % Final  . Platelets 03/04/2015 189  150 - 400 K/uL Final  . Neutrophils Relative % 03/04/2015 55  43 - 77 %  Final  . Neutro Abs 03/04/2015 4.7  1.7 - 7.7 K/uL Final  . Lymphocytes Relative 03/04/2015 29  12 - 46 % Final  . Lymphs Abs 03/04/2015 2.6  0.7 - 4.0 K/uL Final  . Monocytes Relative 03/04/2015 9  3 - 12 % Final  . Monocytes Absolute 03/04/2015 0.8  0.1 - 1.0 K/uL Final  . Eosinophils Relative 03/04/2015 7* 0 - 5 % Final  . Eosinophils Absolute 03/04/2015 0.7  0.0 - 0.7 K/uL Final  . Basophils Relative 03/04/2015 0  0 - 1 % Final  . Basophils Absolute 03/04/2015 0.0  0.0 - 0.1 K/uL Final  . Sodium 03/04/2015 140  135 - 145 mmol/L Final  . Potassium 03/04/2015 4.8  3.5 - 5.1 mmol/L Final  . Chloride 03/04/2015 103  101 - 111 mmol/L Final  . CO2 03/04/2015 30  22 - 32 mmol/L Final  . Glucose, Bld 03/04/2015 85  65 - 99 mg/dL Final  . BUN 03/04/2015 18  6 - 20 mg/dL Final  . Creatinine, Ser 03/04/2015 0.91  0.61 - 1.24 mg/dL Final  . Calcium 03/04/2015 9.8  8.9 - 10.3 mg/dL Final  . Total Protein 03/04/2015 7.5  6.5 - 8.1 g/dL Final  . Albumin 03/04/2015 4.5  3.5 - 5.0 g/dL Final  . AST 03/04/2015 23  15 - 41 U/L Final  . ALT 03/04/2015 13* 17 - 63 U/L Final  . Alkaline Phosphatase 03/04/2015 88  38 - 126 U/L Final  . Total Bilirubin 03/04/2015 0.5  0.3 - 1.2 mg/dL Final  . GFR calc non Af Amer 03/04/2015 >60  >60 mL/min Final  . GFR calc Af Amer 03/04/2015 >60  >60 mL/min Final   Comment: (NOTE) The eGFR has been calculated using the CKD EPI equation. This calculation has not been validated in all clinical situations. eGFR's persistently <60 mL/min signify possible Chronic Kidney Disease.   . Anion gap 03/04/2015 7  5 - 15 Final  . Prothrombin Time 03/04/2015 19.4* 11.6 - 15.2 seconds Final  . INR 03/04/2015 1.64* 0.00 - 1.49 Final  . ABO/RH(D) 03/04/2015 O POS   Final  . Antibody Screen 03/04/2015 NEG   Final  . Sample Expiration 03/04/2015 03/10/2015   Final  . Color, Urine 03/04/2015 YELLOW  YELLOW Final  . APPearance 03/04/2015 CLOUDY* CLEAR Final  . Specific Gravity,  Urine 03/04/2015 1.013  1.005 - 1.030 Final  . pH 03/04/2015 7.5  5.0 - 8.0 Final  . Glucose, UA 03/04/2015 NEGATIVE  NEGATIVE mg/dL Final  . Hgb urine dipstick 03/04/2015 NEGATIVE  NEGATIVE Final  . Bilirubin Urine 03/04/2015 NEGATIVE  NEGATIVE Final  . Ketones, ur 03/04/2015 NEGATIVE  NEGATIVE mg/dL Final  . Protein, ur 03/04/2015 NEGATIVE  NEGATIVE mg/dL Final  . Urobilinogen, UA 03/04/2015 0.2  0.0 - 1.0 mg/dL Final  . Nitrite 03/04/2015 NEGATIVE  NEGATIVE Final  . Leukocytes, UA 03/04/2015 NEGATIVE  NEGATIVE Final   MICROSCOPIC NOT DONE ON URINES WITH NEGATIVE PROTEIN, BLOOD, LEUKOCYTES, NITRITE, OR GLUCOSE <1000 mg/dL.  Marland Kitchen aPTT 03/04/2015 35  24 - 37 seconds Final  . MRSA, PCR 03/04/2015 NEGATIVE  NEGATIVE Final  . Staphylococcus aureus 03/04/2015 NEGATIVE  NEGATIVE Final   Comment:        The Xpert SA Assay (FDA approved for NASAL specimens in patients over 24 years of age), is one component of a comprehensive surveillance program.  Test performance has been validated by [facility] for patients greater than or equal to 52 year old. It is not intended to diagnose infection nor to guide or monitor treatment.       Hospital Course: [name] is a 80 y.o. who was admitted to [facility]. They were brought to the operating room on 03/11/2015 and  underwent Procedure(s): LEFT TOTAL KNEE ARTHROPLASTY.  Patient tolerated the procedure well and was later transferred to the recovery room and then to the orthopaedic floor for postoperative care.  They were given PO and IV analgesics for pain control following their surgery.  They were given 24 hours of postoperative antibiotics of  Anti-infectives    Start     Dose/Rate Route Frequency Ordered Stop   03/11/15 1600  ceFAZolin (ANCEF) IVPB 1 g/50 mL premix     1 g 100 mL/hr over 30 Minutes Intravenous Every 6 hours 03/11/15 1519 03/11/15 2129   03/11/15 1203  polymyxin B 500,000 Units, bacitracin 50,000 Units in sodium  chloride irrigation 0.9 % 500 mL irrigation  Status:  Discontinued       As needed 03/11/15 1203 03/11/15 1238   03/11/15 0751  ceFAZolin (ANCEF) IVPB 2 g/50 mL premix     2 g 100 mL/hr over 30 Minutes Intravenous On call to O.R. 03/11/15 6629 03/11/15 1042     and started on DVT prophylaxis in the form of Xarelto.   PT and OT were ordered for total joint protocol.  Discharge planning consulted to help with postop disposition and equipment needs.  Patient had a good night on the evening of surgery.  They started to get up OOB with therapy on day one. Hemovac drain was pulled without difficulty.  Continued to work with therapy into day two. Patient was seen in rounds and was ready to go home.   Diet: Cardiac diet Activity:WBAT Follow-up:in 2 weeks Disposition - Home Discharged Condition: stable   Discharge Instructions    Call MD / Call 911    Complete by:  As directed   If you experience chest pain or shortness of breath, CALL 911 and be transported to the hospital emergency room.  If you develope a fever above 101 F, pus (white drainage) or increased drainage or redness at the wound, or calf pain, call your surgeon's office.     Constipation Prevention    Complete by:  As directed   Drink plenty of fluids.  Prune juice may be helpful.  You may use a stool softener, such as Colace (over the counter) 100 mg twice a day.  Use MiraLax (over the counter) for constipation as needed.     Diet - low sodium heart healthy    Complete by:  As directed      Discharge instructions    Complete by:  As directed   INSTRUCTIONS AFTER JOINT REPLACEMENT   Remove items at home which could result in a fall. This includes throw rugs or furniture in walking pathways ICE to the affected joint every three hours while awake for 30 minutes at a time, for at least the first 3-5 days, and then as needed for pain and swelling.  Continue to use ice for pain and swelling. You may notice swelling that will progress  down to the foot and ankle.  This is normal after surgery.  Elevate your leg when you are not up walking on it.   Continue to use the breathing machine you got in the hospital (incentive spirometer) which will help keep your temperature down.  It is common for your temperature to cycle up and down following surgery, especially at night when you are not up moving around and exerting yourself.  The breathing machine keeps your lungs expanded and your temperature down.   DIET:  As you were doing prior to hospitalization, we recommend a well-balanced  diet.  DRESSING / WOUND CARE / SHOWERING  Keep the surgical dressing until follow up.  The dressing is water proof, so you can shower without any extra covering.  IF THE DRESSING FALLS OFF or the wound gets wet inside, change the dressing with sterile gauze.  Please use good hand washing techniques before changing the dressing.  Do not use any lotions or creams on the incision until instructed by your surgeon.    ACTIVITY  Increase activity slowly as tolerated, but follow the weight bearing instructions below.   No driving for 6 weeks or until further direction given by your physician.  You cannot drive while taking narcotics.  No lifting or carrying greater than 10 lbs. until further directed by your surgeon. Avoid periods of inactivity such as sitting longer than an hour when not asleep. This helps prevent blood clots.  You may return to work once you are authorized by your doctor.     WEIGHT BEARING   Weight bearing as tolerated with assist device (walker, cane, etc) as directed, use it as long as suggested by your surgeon or therapist, typically at least 4-6 weeks.   EXERCISES  Results after joint replacement surgery are often greatly improved when you follow the exercise, range of motion and muscle strengthening exercises prescribed by your doctor. Safety measures are also important to protect the joint from further injury. Any time any of  these exercises cause you to have increased pain or swelling, decrease what you are doing until you are comfortable again and then slowly increase them. If you have problems or questions, call your caregiver or physical therapist for advice.   Rehabilitation is important following a joint replacement. After just a few days of immobilization, the muscles of the leg can become weakened and shrink (atrophy).  These exercises are designed to build up the tone and strength of the thigh and leg muscles and to improve motion. Often times heat used for twenty to thirty minutes before working out will loosen up your tissues and help with improving the range of motion but do not use heat for the first two weeks following surgery (sometimes heat can increase post-operative swelling).   These exercises can be done on a training (exercise) mat, on the floor, on a table or on a bed. Use whatever works the best and is most comfortable for you.    Use music or television while you are exercising so that the exercises are a pleasant break in your day. This will make your life better with the exercises acting as a break in your routine that you can look forward to.   Perform all exercises about fifteen times, three times per day or as directed.  You should exercise both the operative leg and the other leg as well.   Exercises include:   Quad Sets - Tighten up the muscle on the front of the thigh (Quad) and hold for 5-10 seconds.   Straight Leg Raises - With your knee straight (if you were given a brace, keep it on), lift the leg to 60 degrees, hold for 3 seconds, and slowly lower the leg.  Perform this exercise against resistance later as your leg gets stronger.  Leg Slides: Lying on your back, slowly slide your foot toward your buttocks, bending your knee up off the floor (only go as far as is comfortable). Then slowly slide your foot back down until your leg is flat on the floor again.  Angel Wings: Lying on  your back  spread your legs to the side as far apart as you can without causing discomfort.  Hamstring Strength:  Lying on your back, push your heel against the floor with your leg straight by tightening up the muscles of your buttocks.  Repeat, but this time bend your knee to a comfortable angle, and push your heel against the floor.  You may put a pillow under the heel to make it more comfortable if necessary.   A rehabilitation program following joint replacement surgery can speed recovery and prevent re-injury in the future due to weakened muscles. Contact your doctor or a physical therapist for more information on knee rehabilitation.    CONSTIPATION  Constipation is defined medically as fewer than three stools per week and severe constipation as less than one stool per week.  Even if you have a regular bowel pattern at home, your normal regimen is likely to be disrupted due to multiple reasons following surgery.  Combination of anesthesia, postoperative narcotics, change in appetite and fluid intake all can affect your bowels.   YOU MUST use at least one of the following options; they are listed in order of increasing strength to get the job done.  They are all available over the counter, and you may need to use some, POSSIBLY even all of these options:    Drink plenty of fluids (prune juice may be helpful) and high fiber foods Colace 100 mg by mouth twice a day  Senokot for constipation as directed and as needed Dulcolax (bisacodyl), take with full glass of water  Miralax (polyethylene glycol) once or twice a day as needed.  If you have tried all these things and are unable to have a bowel movement in the first 3-4 days after surgery call either your surgeon or your primary doctor.    If you experience loose stools or diarrhea, hold the medications until you stool forms back up.  If your symptoms do not get better within 1 week or if they get worse, check with your doctor.  If you experience "the  worst abdominal pain ever" or develop nausea or vomiting, please contact the office immediately for further recommendations for treatment.   ITCHING:  If you experience itching with your medications, try taking only a single pain pill, or even half a pain pill at a time.  You can also use Benadryl over the counter for itching or also to help with sleep.   TED HOSE STOCKINGS:  Use stockings on both legs until for at least 2 weeks or as directed by physician office. They may be removed at night for sleeping.  MEDICATIONS:  See your medication summary on the "After Visit Summary" that nursing will review with you.  You may have some home medications which will be placed on hold until you complete the course of blood thinner medication.  It is important for you to complete the blood thinner medication as prescribed.  PRECAUTIONS:  If you experience chest pain or shortness of breath - call 911 immediately for transfer to the hospital emergency department.   If you develop a fever greater that 101 F, purulent drainage from wound, increased redness or drainage from wound, foul odor from the wound/dressing, or calf pain - CONTACT YOUR SURGEON.  FOLLOW-UP APPOINTMENTS:  If you do not already have a post-op appointment, please call the office for an appointment to be seen by your surgeon.  Guidelines for how soon to be seen are listed in your "After Visit Summary", but are typically between 1-4 weeks after surgery.  OTHER INSTRUCTIONS:    MAKE SURE YOU:  Understand these instructions.  Get help right away if you are not doing well or get worse.    Thank you for letting us be a part of your medical care team.  It is a privilege we respect greatly.  We hope these instructions will help you stay on track for a fast and full recovery!     Increase activity slowly as tolerated    Complete by:  As directed             Medication List    TAKE these  medications        albuterol 108 (90 BASE) MCG/ACT inhaler  Commonly known as:  PROVENTIL HFA;VENTOLIN HFA  Inhale 2 puffs into the lungs every 6 (six) hours as needed for wheezing or shortness of breath.     CALCIUM 600 + D PO  Take 1 tablet by mouth every morning.     ipratropium-albuterol 0.5-2.5 (3) MG/3ML Soln  Commonly known as:  DUONEB  Take 3 mLs by nebulization every 4 (four) hours as needed (FOR WHEEZING).     magnesium oxide 400 MG tablet  Commonly known as:  MAG-OX  Take 400 mg by mouth every morning.     multivitamin-iron-minerals-folic acid chewable tablet  Chew 1 tablet by mouth every morning.     OVER THE COUNTER MEDICATION  Leader allergy tablet chlorpheniramine antihistamine tablet daily     oxyCODONE-acetaminophen 5-325 MG per tablet  Commonly known as:  PERCOCET/ROXICET  Take 1-2 tablets by mouth every 4 (four) hours as needed for moderate pain.     tiotropium 18 MCG inhalation capsule  Commonly known as:  SPIRIVA  Place 18 mcg into inhaler and inhale daily.     tiZANidine 4 MG tablet  Commonly known as:  ZANAFLEX  Take 1 tablet (4 mg total) by mouth every 8 (eight) hours as needed.     valsartan 160 MG tablet  Commonly known as:  DIOVAN  Take 80 mg by mouth daily.     vitamin B-12 1000 MCG tablet  Commonly known as:  CYANOCOBALAMIN  Take 1,000 mcg by mouth every morning.     warfarin 5 MG tablet  Commonly known as:  COUMADIN  Take 1 tablet (5 mg total) by mouth daily.           Follow-up Information    Follow up with [facility].   Why:  home health physical therapy and PT/INR check; Butler Denmark has been requested for your physical therapist   Contact information:   [address] [place] [address] [phone]       Follow up with GIOFFRE,RONALD A, MD. Schedule an appointment as soon as possible for a visit in 2 weeks.   Specialty:  Orthopedic Surgery   Contact information:   [address] [place] [address] [phone]       Signed: Ardeen Jourdain, PA-C Orthopaedic Surgery 03/17/2015, 1:33 PM

## 2015-03-19 ENCOUNTER — Telehealth: Payer: Self-pay | Admitting: Pulmonary Disease

## 2015-03-19 MED ORDER — TIOTROPIUM BROMIDE MONOHYDRATE 18 MCG IN CAPS: 18.0000 ug | ORAL_CAPSULE | Freq: Every day | RESPIRATORY_TRACT | Status: DC

## 2015-03-19 NOTE — Telephone Encounter (Signed)
Spoke with pt's wife. States that pt needs refill on Spiriva Handihaler. This has been sent in to [facility]. Nothing further was needed.

## 2015-03-30 ENCOUNTER — Telehealth: Payer: Self-pay | Admitting: Pulmonary Disease

## 2015-03-30 MED ORDER — IPRATROPIUM-ALBUTEROL 0.5-2.5 (3) MG/3ML IN SOLN: RESPIRATORY_TRACT | Status: DC

## 2015-03-30 NOTE — Telephone Encounter (Signed)
Before [name] left, [name] was supposed to have Knee surgery and patient stopped Spiriva.  Patient needs to be back on the Spiriva.  The medication was $131 for 3 months medication and they cannot afford that right now.  Wife was requesting that we mail her a written rx (monthly, not 90 days supply) so that she can take it to her drug store next month to have it filled.  She said that they do not have the money to get the medication until next month when they get paid.  Patient has been without his medication since before his surgery on 03/11/15.  Patient's wife requesting samples of Spiriva, but we do not have samples available.  MW - please advise.

## 2015-03-30 NOTE — Telephone Encounter (Signed)
Error.Stanley A Dalton ° °

## 2015-03-30 NOTE — Telephone Encounter (Signed)
Spoke with patient, he is only taking Dulera and ProAir right now.  Patient says that they paid all of their bills, so they only have enough money to eat with right now and cannot afford the medications until next month.  Patient requested that I call in the [facility] and he will pick it up when he can.  Patient says that he has been doing well without the medication, no SOB, no wheezing, no problems.  Advised patient that if he starts having problems to schedule an appointment to come in.  Patient verbalized understanding.  Rx for Duoneb sent to pharmacy. Nothing further needed.

## 2015-03-30 NOTE — Telephone Encounter (Signed)
Can always change the  duoneb to qid (not prn) in place of spiriva but don't have any samples to offer - ok to change to one month supply - if having to use duoneb qid at baseline and breathing worse he needs to be seen asap with all meds in hand

## 2015-06-23 ENCOUNTER — Ambulatory Visit: Payer: Medicare HMO | Admitting: Pulmonary Disease

## 2015-07-09 ENCOUNTER — Ambulatory Visit (INDEPENDENT_AMBULATORY_CARE_PROVIDER_SITE_OTHER): Payer: Medicare HMO | Admitting: Pulmonary Disease

## 2015-07-09 ENCOUNTER — Encounter: Payer: Self-pay | Admitting: Pulmonary Disease

## 2015-07-09 VITALS — BP 122/82 | HR 73 | Ht 71.0 in | Wt 202.0 lb

## 2015-07-09 DIAGNOSIS — J438 Other emphysema: Secondary | ICD-10-CM

## 2015-07-09 DIAGNOSIS — Z23 Encounter for immunization: Secondary | ICD-10-CM | POA: Diagnosis not present

## 2015-07-09 NOTE — Assessment & Plan Note (Signed)
He has severe COPD and recently had an exacerbation. This was successfully treated and he appears to have recovered. However, I explained to him today that given his severe disease he really needs to be taking a maintenance medication to try to prevent the exacerbations. Currently he is not taking his medications appropriately. I went over appropriate use of Symbicort as a controller medication rather than an as needed medication. Further, I explained that there is no role for him to take both [facility] and Symbicort.  Plan: Stopped Dulera You Symbicort 2 puffs twice a day Continue to use DuoNeb as needed for shortness of breath and wheezing Flu shot today Prevnar today Follow-up 6 months

## 2015-07-09 NOTE — Patient Instructions (Signed)
Stopped taking Dulera Use Symbicort 2 puffs twice a day Rinse your mouth after using Symbicort We will see you back in 6 months or sooner if needed

## 2015-07-09 NOTE — Addendum Note (Signed)
Addended by: Wynn Banker H on: 07/09/2015 12:01 PM   Modules accepted: Orders

## 2015-07-09 NOTE — Progress Notes (Signed)
Subjective:    Patient ID: [name], male    DOB: 10/01/1934, 80 y.o.   MRN: [id number]  Synopsis: Former patient of Dr. Gwenette Greet with COPD PFT's 11/24/2011  FEV1  1.97 (66%) ratio 55 and no better with B2, dlco 94% [name] 01/2015:  FEV1 1.16 (34%), ratio 55, low FVC probably due to airtrapping.   HPI Chief Complaint  Patient presents with  . Follow-up    Former [place] pt here to establish care. COPD. Pt c/o cough with white mucus, occasional wheeze and SOB.     [name] has been doing well lately.  He reports no trouble breathing lately except when he catches a cold every now and then.  Two years ago he had a lot more trouble breathing and cough. No cough lately. Two weeks ago he had to be seen by his primary care doctor for cough with yellow mucus production. + wheeze then.  Treated with amoxicillin only.   He had both knees replaced in June 2015 and June 2016. He continues to take the Symbicort, only as needed. He also has Dulera only as needed as well.   Past Medical History  Diagnosis Date  . Paroxysmal atrial fibrillation (HCC)     coumadin therapy;  echo 1/10: EF 65-70%, mild diast dysfxn  . Hyperlipidemia   . GERD (gastroesophageal reflux disease)   . Allergic rhinitis   . Asthma   . Anxiety   . Sebaceous cyst     scalp  . SVT (supraventricular tachycardia) ([place])     s/p RFCA 05/2010  . History of TIAs   . Hiatal hernia   . COPD (chronic obstructive pulmonary disease) ([place])     admitted 5/11, uses oxygen 2 liters/min prn rare use  . Tubular adenoma 2014    Dr. Hilarie Fredrickson  . Hypertension   . First degree AV block   . CAD (coronary artery disease)     cath 7/09: pCFX 25%, mRCA 25%, EF 60%;  Myoview 6/11: low risk, EF 67%  . Heart murmur   . Shortness of breath dyspnea     with exertion   . Pneumonia     hx of   . Arthritis        Review of Systems  Constitutional: Negative for fever, chills and fatigue.  HENT: Negative for postnasal drip, rhinorrhea and  sinus pressure.   Respiratory: Negative for cough, shortness of breath and wheezing.   Cardiovascular: Negative for chest pain, palpitations and leg swelling.       Objective:   Physical Exam Filed Vitals:   07/09/15 1110  BP: 122/82  Pulse: 73  Height: 5\' 11"  (1.803 m)  Weight: 202 lb (91.627 kg)  SpO2: 97%   RA  Gen: well appearing HENT: OP clear, TM's clear, neck supple PULM: CTA B, normal percussion CV: RRR, no mgr, trace edema GI: BS+, soft, nontender Derm: no cyanosis or rash Psyche: normal mood and affect       Assessment & Plan:  COPD with emphysema (HCC) He has severe COPD and recently had an exacerbation. This was successfully treated and he appears to have recovered. However, I explained to him today that given his severe disease he really needs to be taking a maintenance medication to try to prevent the exacerbations. Currently he is not taking his medications appropriately. I went over appropriate use of Symbicort as a controller medication rather than an as needed medication. Further, I explained that there is no role for  him to take both [facility] and Symbicort.  Plan: Stopped Dulera You Symbicort 2 puffs twice a day Continue to use DuoNeb as needed for shortness of breath and wheezing Flu shot today Prevnar today Follow-up 6 months     Current outpatient prescriptions:  .  budesonide-formoterol (SYMBICORT) 160-4.5 MCG/ACT inhaler, Inhale 2 puffs into the lungs 2 (two) times daily., Disp: , Rfl:  .  albuterol (PROVENTIL HFA;VENTOLIN HFA) 108 (90 BASE) MCG/ACT inhaler, Inhale 2 puffs into the lungs every 6 (six) hours as needed for wheezing or shortness of breath., Disp: , Rfl:  .  Calcium Carb-Cholecalciferol (CALCIUM 600 + D PO), Take 1 tablet by mouth every morning., Disp: , Rfl:  .  ipratropium-albuterol (DUONEB) 0.5-2.5 (3) MG/3ML SOLN, Take 10mls through nebulizer 4 times a day., Disp: 360 mL, Rfl: 0 .  magnesium oxide (MAG-OX) 400 MG tablet, Take 400  mg by mouth every morning. , Disp: , Rfl:  .  multivitamin-iron-minerals-folic acid (CENTRUM) chewable tablet, Chew 1 tablet by mouth every morning. , Disp: , Rfl:  .  OVER THE COUNTER MEDICATION, Leader allergy tablet chlorpheniramine antihistamine tablet daily, Disp: , Rfl:  .  valsartan (DIOVAN) 160 MG tablet, Take 80 mg by mouth daily., Disp: , Rfl:  .  vitamin B-12 (CYANOCOBALAMIN) 1000 MCG tablet, Take 1,000 mcg by mouth every morning. , Disp: , Rfl:  .  warfarin (COUMADIN) 1 MG tablet, , Disp: , Rfl:  .  warfarin (COUMADIN) 3 MG tablet, , Disp: , Rfl:  .  [DISCONTINUED] atorvastatin (LIPITOR) 20 MG tablet, Take 20 mg by mouth daily.  , Disp: , Rfl:  .  [DISCONTINUED] clonazePAM (KLONOPIN) 0.5 MG tablet, Take 0.5 mg by mouth at bedtime as needed. For sleep , Disp: , Rfl:  .  [DISCONTINUED] furosemide (LASIX) 20 MG tablet, Take 20 mg by mouth daily.  , Disp: , Rfl:

## 2015-10-12 ENCOUNTER — Ambulatory Visit (INDEPENDENT_AMBULATORY_CARE_PROVIDER_SITE_OTHER): Payer: Medicare HMO | Admitting: Pulmonary Disease

## 2015-10-12 ENCOUNTER — Ambulatory Visit (INDEPENDENT_AMBULATORY_CARE_PROVIDER_SITE_OTHER)
Admission: RE | Admit: 2015-10-12 | Discharge: 2015-10-12 | Disposition: A | Discharge status: Home / Self Care | Payer: Medicare HMO | Source: Ambulatory Visit | Attending: Pulmonary Disease | Admitting: Pulmonary Disease

## 2015-10-12 ENCOUNTER — Encounter: Payer: Self-pay | Admitting: Pulmonary Disease

## 2015-10-12 VITALS — BP 136/70 | HR 70 | Ht 71.0 in | Wt 203.0 lb

## 2015-10-12 DIAGNOSIS — J441 Chronic obstructive pulmonary disease with (acute) exacerbation: Secondary | ICD-10-CM

## 2015-10-12 DIAGNOSIS — R059 Cough, unspecified: Secondary | ICD-10-CM

## 2015-10-12 DIAGNOSIS — R05 Cough: Secondary | ICD-10-CM | POA: Diagnosis not present

## 2015-10-12 NOTE — Progress Notes (Signed)
Subjective:    Patient ID: [name], male    DOB: May 01, 1935, 81 y.o.   MRN: [id number]  Synopsis: Former patient of Dr. Gwenette [name] with COPD PFT's 11/24/2011  FEV1  1.97 (66%) ratio 55 and no better with B2, dlco 94% [name] 01/2015:  FEV1 1.16 (34%), ratio 55, low FVC probably due to airtrapping.   HPI Chief Complaint  Patient presents with  . Follow-up    pt c/o prod cough with yellow/white mucus X1 week.  Denies chest pain/tightness, sinus congestion.  Also c/o increased SOB, both with and without exertion.    [name] has been wheezing more in cold air and has been more short of breath. He feels like he has had a cold in the last few weeks in that he has had a cough with sinus congestion.  He took some amoxicillin last week and he thinks that the is getting better. He is taking the Symbicort only a few times a week, not as needed. He is not using Dulera. He only uses albuterol at night as needed. No sick contacts recently.    He denies fever, chills, orthopnea, chest pain or leg swelling.    Past Medical History  Diagnosis Date  . Paroxysmal atrial fibrillation (HCC)     coumadin therapy;  echo 1/10: EF 65-70%, mild diast dysfxn  . Hyperlipidemia   . GERD (gastroesophageal reflux disease)   . Allergic rhinitis   . Asthma   . Anxiety   . Sebaceous cyst     scalp  . SVT (supraventricular tachycardia) ([place])     s/p RFCA 05/2010  . History of TIAs   . Hiatal hernia   . COPD (chronic obstructive pulmonary disease) ([place])     admitted 5/11, uses oxygen 2 liters/min prn rare use  . Tubular adenoma 2014    Dr. Hilarie [name]  . Hypertension   . First degree AV block   . CAD (coronary artery disease)     cath 7/09: pCFX 25%, mRCA 25%, EF 60%;  Myoview 6/11: low risk, EF 67%  . Heart murmur   . Shortness of breath dyspnea     with exertion   . Pneumonia     hx of   . Arthritis        Review of Systems  Constitutional: Negative for fever, chills and fatigue.  HENT:  Positive for rhinorrhea. Negative for postnasal drip and sinus pressure.   Respiratory: Positive for cough, shortness of breath and wheezing.   Cardiovascular: Negative for chest pain, palpitations and leg swelling.       Objective:   Physical Exam Filed Vitals:   10/12/15 1422  BP: 136/70  Pulse: 70  Height: 5\' 11"  (1.803 m)  Weight: 203 lb (92.08 kg)  SpO2: 94%   RA  Gen: well appearing HENT: OP clear, TM's clear, neck supple PULM: CTA B, normal percussion CV: RRR, no mgr, trace edema GI: BS+, soft, nontender Derm: no cyanosis or rash Psyche: normal mood and affect       Assessment & Plan:  COPD exacerbation (HCC) Unfortunately [name] is having an exacerbation of his severe COPD.  He is not using his Symbicort regularly (again) so we need to have him taking it regularly, not as needed.  Because of his focal wheeze I would like for him to have a CXR today.  Plan: CXR today Symbicort 2 puffs twice a day Prednisone 20mg  daily 5 days F/u 3-4 months or sooner if needed  Current outpatient prescriptions:  .  albuterol (PROVENTIL HFA;VENTOLIN HFA) 108 (90 BASE) MCG/ACT inhaler, Inhale 2 puffs into the lungs every 6 (six) hours as needed for wheezing or shortness of breath., Disp: , Rfl:  .  budesonide-formoterol (SYMBICORT) 160-4.5 MCG/ACT inhaler, Inhale 2 puffs into the lungs 2 (two) times daily., Disp: , Rfl:  .  Calcium Carb-Cholecalciferol (CALCIUM 600 + D PO), Take 1 tablet by mouth every morning., Disp: , Rfl:  .  ipratropium-albuterol (DUONEB) 0.5-2.5 (3) MG/3ML SOLN, Take 14mls through nebulizer 4 times a day., Disp: 360 mL, Rfl: 0 .  magnesium oxide (MAG-OX) 400 MG tablet, Take 400 mg by mouth every morning. , Disp: , Rfl:  .  multivitamin-iron-minerals-folic acid (CENTRUM) chewable tablet, Chew 1 tablet by mouth every morning. , Disp: , Rfl:  .  OVER THE COUNTER MEDICATION, Leader allergy tablet chlorpheniramine antihistamine tablet daily, Disp: , Rfl:    .  valsartan (DIOVAN) 160 MG tablet, Take 80 mg by mouth daily., Disp: , Rfl:  .  vitamin B-12 (CYANOCOBALAMIN) 1000 MCG tablet, Take 1,000 mcg by mouth every morning. , Disp: , Rfl:  .  warfarin (COUMADIN) 3 MG tablet, , Disp: , Rfl:  .  [DISCONTINUED] atorvastatin (LIPITOR) 20 MG tablet, Take 20 mg by mouth daily.  , Disp: , Rfl:  .  [DISCONTINUED] clonazePAM (KLONOPIN) 0.5 MG tablet, Take 0.5 mg by mouth at bedtime as needed. For sleep , Disp: , Rfl:  .  [DISCONTINUED] furosemide (LASIX) 20 MG tablet, Take 20 mg by mouth daily.  , Disp: , Rfl:

## 2015-10-12 NOTE — Patient Instructions (Signed)
We will call you with results of the chest x-ray Take Symbicort 2 puffs twice a day no matter how you feel Do not use Dulera Take the prednisone for 5 days We will see you back in 3-4 months or sooner if needed

## 2015-10-12 NOTE — Assessment & Plan Note (Signed)
Unfortunately [name] is having an exacerbation of his severe COPD.  He is not using his Symbicort regularly (again) so we need to have him taking it regularly, not as needed.  Because of his focal wheeze I would like for him to have a CXR today.  Plan: CXR today Symbicort 2 puffs twice a day Prednisone 20mg  daily 5 days F/u 3-4 months or sooner if needed

## 2015-10-13 ENCOUNTER — Telehealth: Payer: Self-pay | Admitting: Pulmonary Disease

## 2015-10-13 MED ORDER — PREDNISONE 20 MG PO TABS: 20.0000 mg | ORAL_TABLET | Freq: Every day | ORAL | Status: DC

## 2015-10-13 NOTE — Telephone Encounter (Signed)
Per 10/12/15 OV: Plan: CXR today Symbicort 2 puffs twice a day Prednisone 20mg  daily 5 days F/u 3-4 months or sooner if needed ---  RX was never called in. I have sent this in. Pt is aware. Nothing further needed

## 2016-01-19 ENCOUNTER — Ambulatory Visit (INDEPENDENT_AMBULATORY_CARE_PROVIDER_SITE_OTHER): Payer: Medicare HMO | Admitting: Pulmonary Disease

## 2016-01-19 ENCOUNTER — Encounter: Payer: Self-pay | Admitting: Pulmonary Disease

## 2016-01-19 VITALS — BP 138/74 | HR 74 | Ht 71.0 in | Wt 204.0 lb

## 2016-01-19 DIAGNOSIS — J309 Allergic rhinitis, unspecified: Secondary | ICD-10-CM

## 2016-01-19 DIAGNOSIS — J438 Other emphysema: Secondary | ICD-10-CM | POA: Diagnosis not present

## 2016-01-19 NOTE — Assessment & Plan Note (Signed)
This has been a stable interval for him. He has not had an exacerbation. His compliance with Symbicort is decreased primarily due to cost.  Plan: Symbicort samples provided I encouraged he and his wife to review the medication formulary to see which alternatives are available Flu shot in the fall Follow-up 6 months

## 2016-01-19 NOTE — Patient Instructions (Signed)
I recommend that you take Symbicort 2 puffs twice a day, no matter how you feel However, there may be a formulary alternative covered by her insurance company. I recommend that you ask for your medication formulary and then look to see which respiratory medications are covered. You can call our office to let us know which one is covered and we can make the switch to that medicine.  Use [facility] Med rinses with distilled water at least twice per day using the instructions on the package. 1/2 hour after using the [facility] rinse, use Nasacort two puffs in each nostril once per day.  Remember that the Nasacort can take 1-2 weeks to work after regular use. Use generic zyrtec (cetirizine) every day.  If this doesn't help, then stop taking it and use chlorpheniramine-phenylephrine combination tablets.  We will see you back in 6 months or sooner if needed

## 2016-01-19 NOTE — Assessment & Plan Note (Signed)
He has been experiencing more allergic rhinitis symptoms.  Plan: Generic Zyrtec, Nasacort, saline rinses

## 2016-01-19 NOTE — Progress Notes (Signed)
Subjective:    Patient ID: [name], male    DOB: 1934/10/20, 81 y.o.   MRN: [id number]  Synopsis: Former patient of Dr. Gwenette Greet with COPD PFT's 11/24/2011  FEV1  1.97 (66%) ratio 55 and no better with B2, dlco 94% [name] 01/2015:  FEV1 1.16 (34%), ratio 55, low FVC probably due to airtrapping.   HPI Chief Complaint  Patient presents with  . Follow-up    pt c/o sinus congestion, pnd, sore throat, mostly nonprod cough-attributes this to pollen.  s/s present X2-3 days.     Nam has been coughing more lately, and his nose has been running more.    He has baseline dyspnea which hasn't worsened too much.  This is a little worse when he is carrying heavy objects.  He wheezes some, he never really takes the albuterol inhaler though.  He currently is out of Symbicort.  He is using his nebulizer machine once nightly which helps.  He does not use oxygen.  No flare ups of COPD since the last visit.   Past Medical History  Diagnosis Date  . Paroxysmal atrial fibrillation (HCC)     coumadin therapy;  echo 1/10: EF 65-70%, mild diast dysfxn  . Hyperlipidemia   . GERD (gastroesophageal reflux disease)   . Allergic rhinitis   . Asthma   . Anxiety   . Sebaceous cyst     scalp  . SVT (supraventricular tachycardia) ([place])     s/p RFCA 05/2010  . History of TIAs   . Hiatal hernia   . COPD (chronic obstructive pulmonary disease) ([place])     admitted 5/11, uses oxygen 2 liters/min prn rare use  . Tubular adenoma 2014    Dr. Hilarie Fredrickson  . Hypertension   . First degree AV block   . CAD (coronary artery disease)     cath 7/09: pCFX 25%, mRCA 25%, EF 60%;  Myoview 6/11: low risk, EF 67%  . Heart murmur   . Shortness of breath dyspnea     with exertion   . Pneumonia     hx of   . Arthritis        Review of Systems  Constitutional: Negative for fever, chills and fatigue.  HENT: Positive for rhinorrhea. Negative for postnasal drip and sinus pressure.   Respiratory: Positive for  cough, shortness of breath and wheezing.   Cardiovascular: Negative for chest pain, palpitations and leg swelling.       Objective:   Physical Exam Filed Vitals:   01/19/16 1348  BP: 138/74  Pulse: 74  Height: 5\' 11"  (1.803 m)  Weight: 204 lb (92.534 kg)  SpO2: 98%   RA  Gen: well appearing HENT: OP clear, TM's clear, neck supple PULM: CTA B, normal percussion CV: RRR, no mgr, trace edema GI: BS+, soft, nontender Derm: no cyanosis or rash Psyche: normal mood and affect  CXR last visit> hyperinflation and airway thickening     Assessment & Plan:  COPD with emphysema ([place]) This has been a stable interval for him. He has not had an exacerbation. His compliance with Symbicort is decreased primarily due to cost.  Plan: Symbicort samples provided I encouraged he and his wife to review the medication formulary to see which alternatives are available Flu shot in the fall Follow-up 6 months  Allergic rhinitis He has been experiencing more allergic rhinitis symptoms.  Plan: Generic Zyrtec, Nasacort, saline rinses     Current outpatient prescriptions:  .  albuterol (PROVENTIL HFA;VENTOLIN HFA)  108 (90 BASE) MCG/ACT inhaler, Inhale 2 puffs into the lungs every 6 (six) hours as needed for wheezing or shortness of breath., Disp: , Rfl:  .  budesonide-formoterol (SYMBICORT) 160-4.5 MCG/ACT inhaler, Inhale 2 puffs into the lungs 2 (two) times daily., Disp: , Rfl:  .  Calcium Carb-Cholecalciferol (CALCIUM 600 + D PO), Take 1 tablet by mouth every morning., Disp: , Rfl:  .  magnesium oxide (MAG-OX) 400 MG tablet, Take 400 mg by mouth every morning. , Disp: , Rfl:  .  multivitamin-iron-minerals-folic acid (CENTRUM) chewable tablet, Chew 1 tablet by mouth every morning. , Disp: , Rfl:  .  OVER THE COUNTER MEDICATION, Leader allergy tablet chlorpheniramine antihistamine tablet daily, Disp: , Rfl:  .  predniSONE (DELTASONE) 20 MG tablet, Take 1 tablet (20 mg total) by mouth daily with  breakfast., Disp: 5 tablet, Rfl: 0 .  valsartan (DIOVAN) 160 MG tablet, Take 80 mg by mouth daily., Disp: , Rfl:  .  vitamin B-12 (CYANOCOBALAMIN) 1000 MCG tablet, Take 1,000 mcg by mouth every morning. , Disp: , Rfl:  .  warfarin (COUMADIN) 3 MG tablet, , Disp: , Rfl:  .  ipratropium-albuterol (DUONEB) 0.5-2.5 (3) MG/3ML SOLN, Take 80mls through nebulizer 4 times a day. (Patient not taking: Reported on 01/19/2016), Disp: 360 mL, Rfl: 0 .  [DISCONTINUED] atorvastatin (LIPITOR) 20 MG tablet, Take 20 mg by mouth daily.  , Disp: , Rfl:  .  [DISCONTINUED] clonazePAM (KLONOPIN) 0.5 MG tablet, Take 0.5 mg by mouth at bedtime as needed. For sleep , Disp: , Rfl:  .  [DISCONTINUED] furosemide (LASIX) 20 MG tablet, Take 20 mg by mouth daily.  , Disp: , Rfl:

## 2016-03-03 ENCOUNTER — Encounter: Payer: Self-pay | Admitting: *Deleted

## 2016-03-17 ENCOUNTER — Encounter: Payer: Self-pay | Admitting: Internal Medicine

## 2016-03-17 ENCOUNTER — Ambulatory Visit (INDEPENDENT_AMBULATORY_CARE_PROVIDER_SITE_OTHER): Payer: Medicare HMO | Admitting: Internal Medicine

## 2016-03-17 ENCOUNTER — Encounter (INDEPENDENT_AMBULATORY_CARE_PROVIDER_SITE_OTHER): Payer: Self-pay

## 2016-03-17 VITALS — BP 164/83 | HR 64 | Ht 71.0 in | Wt 203.8 lb

## 2016-03-17 DIAGNOSIS — I10 Essential (primary) hypertension: Secondary | ICD-10-CM | POA: Diagnosis not present

## 2016-03-17 DIAGNOSIS — I480 Paroxysmal atrial fibrillation: Secondary | ICD-10-CM | POA: Diagnosis not present

## 2016-03-17 NOTE — Progress Notes (Signed)
Patient Care Team: Paris Lore, MD as PCP - General (Family Medicine)   HPI  [name] is a 81 y.o. male Seen in followup for  atrial fibrillation requiring cardioversion in 2009. He   has maintained SR since and is chronically anticoagulated with Warfarin. He also has COPD and was previously oxygen dependent. Since last being seen in our clinic, the patient reports doing very well. He feels that he is maintaining SR. He denies chest pain, palpitations, dyspnea, PND, orthopnea, nausea, vomiting, dizziness, syncope. He has intercurrently undergone bilateral knee replacement with a significant improvement in his activity level.   Echocardiogram 01/2015 demonstrated EF 55-60%, no clear RWMA, LA 32.     Past Medical History  Diagnosis Date  . Paroxysmal atrial fibrillation (HCC)     coumadin therapy;  echo 1/10: EF 65-70%, mild diast dysfxn  . Hyperlipidemia   . GERD (gastroesophageal reflux disease)   . Allergic rhinitis   . Asthma   . Anxiety   . Sebaceous cyst     scalp  . SVT (supraventricular tachycardia) ([place])     s/p RFCA 05/2010  . History of TIAs   . Hiatal hernia   . COPD (chronic obstructive pulmonary disease) ([place])     admitted 5/11, uses oxygen 2 liters/min prn rare use  . Tubular adenoma 2014    Dr. Hilarie Fredrickson  . Hypertension   . First degree AV block   . CAD (coronary artery disease)     cath 7/09: pCFX 25%, mRCA 25%, EF 60%;  Myoview 6/11: low risk, EF 67%  . Heart murmur   . Shortness of breath dyspnea     with exertion   . Pneumonia     hx of   . Arthritis     Past Surgical History  Procedure Laterality Date  . Cystectomy  2012    removed from head  . Colonoscopy N/A 02/07/2013    Procedure: COLONOSCOPY;  Surgeon: Jerene Bears, MD;  Location: [facility];  Service: Gastroenterology;  Laterality: N/A;  . Esophagogastroduodenoscopy N/A 02/07/2013    Procedure: ESOPHAGOGASTRODUODENOSCOPY (EGD);  Surgeon: Jerene Bears, MD;  Location: [facility];  Service: Gastroenterology;  Laterality: N/A;  . Back surgery  1980    lower L 4 to L 5  . Cardiac electrophysiology mapping and ablation  2008    Dr Caryl Comes - details not avaialble in [facility]  . Inguinal hernia repair Left 04/15/2013    Procedure: LEFT HERNIA REPAIR INGUINAL INCARCERATED;  Surgeon: Gayland Curry, MD;  Location: [facility] ORS;  Service: General;  Laterality: Left;  . Insertion of mesh Left 04/15/2013    Procedure: INSERTION OF MESH;  Surgeon: Gayland Curry, MD;  Location: [facility] OR[facility];  Service: General;  Laterality: Left;  . Total knee arthroplasty Right 02/19/2014    Procedure: RIGHT TOTAL KNEE ARTHROPLASTY;  Surgeon: Tobi Bastos, MD;  Location: [facility] ORS;  Service: Orthopedics;  Laterality: Right;  . Cardioversion  2009    Dr Percival Spanish  . Total knee arthroplasty Left 03/11/2015    Procedure: LEFT TOTAL KNEE ARTHROPLASTY;  Surgeon: Latanya Maudlin, MD;  Location: [facility] OR[facility];  Service: Orthopedics;  Laterality: Left;    Current Outpatient Prescriptions  Medication Sig Dispense Refill  . albuterol (PROVENTIL HFA;VENTOLIN HFA) 108 (90 BASE) MCG/ACT inhaler Inhale 2 puffs into the lungs every 6 (six) hours as needed for wheezing or shortness of breath.    . budesonide-formoterol (SYMBICORT) 160-4.5 MCG/ACT inhaler Inhale 2  puffs into the lungs 2 (two) times daily.    . Calcium Carb-Cholecalciferol (CALCIUM 600 + D PO) Take 1 tablet by mouth every morning.    Marland Kitchen ipratropium-albuterol (DUONEB) 0.5-2.5 (3) MG/3ML SOLN Take 64mls through nebulizer 4 times a day. 360 mL 0  . magnesium oxide (MAG-OX) 400 MG tablet Take 400 mg by mouth every morning.     . multivitamin-iron-minerals-folic acid (CENTRUM) chewable tablet Chew 1 tablet by mouth every morning.     Marland Kitchen OVER THE COUNTER MEDICATION Leader allergy tablet chlorpheniramine antihistamine tablet daily    . predniSONE (DELTASONE) 20 MG tablet Take 1 tablet (20 mg total) by mouth daily with breakfast. 5 tablet 0  . valsartan (DIOVAN) 160 MG tablet  Take 80 mg by mouth daily.    . vitamin B-12 (CYANOCOBALAMIN) 1000 MCG tablet Take 1,000 mcg by mouth every morning.     . warfarin (COUMADIN) 3 MG tablet     . [DISCONTINUED] atorvastatin (LIPITOR) 20 MG tablet Take 20 mg by mouth daily.      . [DISCONTINUED] clonazePAM (KLONOPIN) 0.5 MG tablet Take 0.5 mg by mouth at bedtime as needed. For sleep     . [DISCONTINUED] furosemide (LASIX) 20 MG tablet Take 20 mg by mouth daily.       No current facility-administered medications for this visit.    No Known Allergies    Review of Systems negative except from HPI and PMH  Physical Exam BP 164/83 mmHg  Pulse 64  Ht 5\' 11"  (1.803 m)  Wt 203 lb 12.8 oz (92.443 kg)  BMI 28.44 kg/m2  SpO2 96% Well developed and well nourished in no acute distress HENT normal E scleral and icterus clear Neck Supple Clear to ausculation  Regular rate and rhythm, no murmurs gallops or rub Soft  s No clubbing cyanosis none Edema Alert and oriented, grossly normal motor and sensory function Skin Warm and Dry  ECG demonstrates sinus rhythm at 64 Intervals 19/09/39   Assessment and  Plan Atrial fibrillation  Hypertension  No interval atrial fibrillation which she is aware  No bleeding issues on the Coumadin.   I have recommended that he follow up his blood pressure as an outpatient and if it is above 130/85 consistently that he contact his PCP.

## 2016-03-17 NOTE — Patient Instructions (Signed)

## 2016-07-26 ENCOUNTER — Ambulatory Visit: Payer: Medicare HMO | Admitting: Pulmonary Disease

## 2016-08-18 ENCOUNTER — Ambulatory Visit (INDEPENDENT_AMBULATORY_CARE_PROVIDER_SITE_OTHER): Payer: Medicare HMO | Admitting: Pulmonary Disease

## 2016-08-18 ENCOUNTER — Encounter: Payer: Self-pay | Admitting: Pulmonary Disease

## 2016-08-18 VITALS — BP 116/70 | HR 60 | Ht 71.0 in | Wt 201.6 lb

## 2016-08-18 DIAGNOSIS — Z23 Encounter for immunization: Secondary | ICD-10-CM | POA: Diagnosis not present

## 2016-08-18 DIAGNOSIS — J411 Mucopurulent chronic bronchitis: Secondary | ICD-10-CM | POA: Diagnosis not present

## 2016-08-18 MED ORDER — ALBUTEROL SULFATE HFA 108 (90 BASE) MCG/ACT IN AERS: 2.0000 | INHALATION_SPRAY | RESPIRATORY_TRACT | 5 refills | Status: DC | PRN

## 2016-08-18 MED ORDER — ACAPELLA MISC: 0 refills | Status: AC

## 2016-08-18 NOTE — Progress Notes (Signed)
Subjective:    Patient ID: [name], male    DOB: [birth year], 81 y.o.   MRN: [id number]  Synopsis: Former patient of Dr. Gwenette Greet with COPD PFT's 11/24/2011  FEV1  1.97 (66%) ratio 55 and no better with B2, dlco 94% [name] 01/2015:  FEV1 1.16 (34%), ratio 55, low FVC probably due to airtrapping.   HPI Chief Complaint  Patient presents with  . Follow-up    6 month follow up. Breathing has ok, but has been coughing a lot. Unproductive cough. No chest tightness.    [name] is coughing a lot: > bringing up phlegm, yellow in color > the cough is primarily at night and when he gets up in the morning > mild shortness of breath with the cough > he can't tell if he has sinus drainage, doesn't feel stopped up > no heart burn > the cough is actually better than it had been before  Dyspnea: > minimal in the daytime, only really occurs with cough > associated with some wheezing > he uses his Symbicort 2-3 times a day as needed for shortness of breath > he uses his albuterol nebulizer only at bedtime, rarely in the mornings > He does not use an albuterol rescue inhaler  Past Medical History:  Diagnosis Date  . Allergic rhinitis   . Anxiety   . Arthritis   . Asthma   . CAD (coronary artery disease)    cath 7/09: pCFX 25%, mRCA 25%, EF 60%;  Myoview 6/11: low risk, EF 67%  . COPD (chronic obstructive pulmonary disease) ([place])    admitted 5/11, uses oxygen 2 liters/min prn rare use  . First degree AV block   . GERD (gastroesophageal reflux disease)   . Heart murmur   . Hiatal hernia   . History of TIAs   . Hyperlipidemia   . Hypertension   . Paroxysmal atrial fibrillation (HCC)    coumadin therapy;  echo 1/10: EF 65-70%, mild diast dysfxn  . Pneumonia    hx of   . Sebaceous cyst    scalp  . Shortness of breath dyspnea    with exertion   . SVT (supraventricular tachycardia) ([place])    s/p RFCA 05/2010  . Tubular adenoma 2014   Dr. Hilarie Fredrickson       Review of Systems    Constitutional: Negative for chills, fatigue and fever.  HENT: Positive for rhinorrhea. Negative for postnasal drip and sinus pressure.   Respiratory: Positive for cough, shortness of breath and wheezing.   Cardiovascular: Negative for chest pain, palpitations and leg swelling.       Objective:   Physical Exam Vitals:   08/18/16 1450  BP: 116/70  Pulse: 60  SpO2: 96%  Weight: 201 lb 9.6 oz (91.4 kg)  Height: 5\' 11"  (1.803 m)   RA  Gen: well appearing HENT: OP clear, TM's clear, neck supple PULM: CTA B, normal percussion CV: RRR, no mgr, trace edema GI: BS+, soft, nontender Derm: no cyanosis or rash Psyche: normal mood and affect  CXR January 2017> hyperinflation and airway thickening     Assessment & Plan:  Impression: Chronic bronchitis with COPD  Discussion: This is been a stable interval for [name]. However, he continues to have significant symptoms from his mucopurulent bronchitis.  This comes in large part due to his lack of understanding about the appropriate use of controller versus rescue medicines. We spent 10 minutes today going over the difference between these 2 classes of medications  and appropriate use.  For your chronic bronchitis with COPD: Flu shot today Use Symbicort 2 puffs twice a day no matter how you feel Use albuterol as needed for chest tightness or shortness of breath, 2 puffs every 4-6 hours as needed Use guaifenesin 600 mg twice a day to help thin the mucus Use the flutter valve twice a day, 4-5 breath at a time doubt clear out the mucus  We will see you back in 6 months or sooner if needed  Current Outpatient Prescriptions:  .  albuterol (PROVENTIL HFA;VENTOLIN HFA) 108 (90 BASE) MCG/ACT inhaler, Inhale 2 puffs into the lungs every 6 (six) hours as needed for wheezing or shortness of breath., Disp: , Rfl:  .  budesonide-formoterol (SYMBICORT) 160-4.5 MCG/ACT inhaler, Inhale 2 puffs into the lungs 2 (two) times daily., Disp: , Rfl:  .   Calcium Carb-Cholecalciferol (CALCIUM 600 + D PO), Take 1 tablet by mouth every morning., Disp: , Rfl:  .  ipratropium-albuterol (DUONEB) 0.5-2.5 (3) MG/3ML SOLN, Take 39mls through nebulizer 4 times a day., Disp: 360 mL, Rfl: 0 .  magnesium oxide (MAG-OX) 400 MG tablet, Take 400 mg by mouth every morning. , Disp: , Rfl:  .  multivitamin-iron-minerals-folic acid (CENTRUM) chewable tablet, Chew 1 tablet by mouth every morning. , Disp: , Rfl:  .  OVER THE COUNTER MEDICATION, Leader allergy tablet chlorpheniramine antihistamine tablet daily, Disp: , Rfl:  .  valsartan (DIOVAN) 160 MG tablet, Take 80 mg by mouth daily., Disp: , Rfl:  .  vitamin B-12 (CYANOCOBALAMIN) 1000 MCG tablet, Take 1,000 mcg by mouth every morning. , Disp: , Rfl:  .  warfarin (COUMADIN) 3 MG tablet, , Disp: , Rfl:

## 2016-08-18 NOTE — Patient Instructions (Signed)
For your chronic bronchitis with COPD: Flu shot today Use Symbicort 2 puffs twice a day no matter how you feel Use albuterol as needed for chest tightness or shortness of breath, 2 puffs every 4-6 hours as needed Use guaifenesin 600 mg twice a day to help thin the mucus Use the flutter valve twice a day, 4-5 breath at a time doubt clear out the mucus  We will see you back in 6 months or sooner if needed

## 2016-11-10 ENCOUNTER — Telehealth: Payer: Self-pay | Admitting: Pulmonary Disease

## 2016-11-10 NOTE — Telephone Encounter (Signed)
Dr. Lake Bells  Please Advise-  Pt went to his PCP yesterday and they were out of Symbicort  160 samples and they gave him two samples of Breo 100mg . Pt wife wanted to know if it was ok if he continues to take the [facility] and if so can a rx with refills be sent in. She says his pcp will be leaving at the end of March so they will have to re-establish with someone else.

## 2016-11-10 NOTE — Telephone Encounter (Signed)
Spoke with pt's wife. She is aware of BQ's response. Nothing further was needed.

## 2016-11-10 NOTE — Telephone Encounter (Signed)
Attempted to contact pt's wife. Line rang busy x2. Will try back. 

## 2016-11-10 NOTE — Telephone Encounter (Signed)
OK to continue Breo 1 puff daily for now Let us know how he feels on it prior to [place] sending Rx.  Recommend he call again in 1-2 weeks to keep [place] updated.

## 2016-11-28 ENCOUNTER — Telehealth: Payer: Self-pay | Admitting: Pulmonary Disease

## 2016-11-28 NOTE — Telephone Encounter (Signed)
lmtcb x1 for pt's wife, Ulis Rias.

## 2016-11-30 NOTE — Telephone Encounter (Signed)
lmomtcb x 2  

## 2016-12-01 NOTE — Telephone Encounter (Signed)
lmtcb x3 

## 2016-12-02 NOTE — Telephone Encounter (Signed)
lmtcb X4 for pt.  Will close encounter per triage protocol.  

## 2017-02-13 ENCOUNTER — Telehealth: Payer: Self-pay | Admitting: Pulmonary Disease

## 2017-02-13 MED ORDER — BUDESONIDE-FORMOTEROL FUMARATE 160-4.5 MCG/ACT IN AERO: 2.0000 | INHALATION_SPRAY | Freq: Two times a day (BID) | RESPIRATORY_TRACT | 4 refills | Status: DC

## 2017-02-13 NOTE — Telephone Encounter (Signed)
Spoke with Ulis Rias. She stated the patient needs refills on Symbicort to be called into [place]. Rx has been sent in. She is aware. Nothing further needed.

## 2017-04-05 ENCOUNTER — Ambulatory Visit (INDEPENDENT_AMBULATORY_CARE_PROVIDER_SITE_OTHER): Payer: Medicare HMO | Admitting: Pulmonary Disease

## 2017-04-05 ENCOUNTER — Ambulatory Visit (INDEPENDENT_AMBULATORY_CARE_PROVIDER_SITE_OTHER)
Admission: RE | Admit: 2017-04-05 | Discharge: 2017-04-05 | Disposition: A | Discharge status: Home / Self Care | Payer: Medicare HMO | Source: Ambulatory Visit | Attending: Pulmonary Disease | Admitting: Pulmonary Disease

## 2017-04-05 ENCOUNTER — Encounter: Payer: Self-pay | Admitting: Pulmonary Disease

## 2017-04-05 VITALS — BP 136/84 | HR 78 | Ht 71.0 in | Wt 199.2 lb

## 2017-04-05 DIAGNOSIS — R0609 Other forms of dyspnea: Secondary | ICD-10-CM

## 2017-04-05 DIAGNOSIS — J441 Chronic obstructive pulmonary disease with (acute) exacerbation: Secondary | ICD-10-CM

## 2017-04-05 DIAGNOSIS — J411 Mucopurulent chronic bronchitis: Secondary | ICD-10-CM | POA: Diagnosis not present

## 2017-04-05 MED ORDER — ALBUTEROL SULFATE HFA 108 (90 BASE) MCG/ACT IN AERS: 2.0000 | INHALATION_SPRAY | RESPIRATORY_TRACT | 5 refills | Status: DC | PRN

## 2017-04-05 MED ORDER — PREDNISONE 20 MG PO TABS: 20.0000 mg | ORAL_TABLET | Freq: Every day | ORAL | 0 refills | Status: DC

## 2017-04-05 NOTE — Patient Instructions (Signed)
For your COPD exacerbation: Take prednisone 20 mg daily 5 days We will check a chest x-ray you with the results Restart Symbicort, 2 puffs twice a day no matter how you feel Use Ventolin as needed for chest tightness or shortness of breath  Come back and see our nurse practitioner in 6 weeks or sooner if needed

## 2017-04-05 NOTE — Progress Notes (Signed)
Subjective:    Patient ID: [name], male    DOB: 11-07-1934, 82 y.o.   MRN: [id number]  Synopsis: Former patient of Dr. Gwenette Greet with COPD PFT's 11/24/2011  FEV1  1.97 (66%) ratio 55 and no better with B2, dlco 94% [name] 01/2015:  FEV1 1.16 (34%), ratio 55, low FVC probably due to airtrapping.   HPI Chief Complaint  Patient presents with  . Follow-up    pt c/o worsening sob at rest and with exertion, mostly nonprod cough, chest tightness.     [name] has been sick for 1-2 weeks.  He caught a cold despite no sick contacts.  He has been feeling more coughing, headache, some sinus drainage.  No chest pain, no fever or chills. He has been more short of breath.  He hasn't been on his albuterol for a while.    He doesn't have his symbicort, he ran out maybe a week or so ago.    They just established care with a new PCP.   They are once again confused about his medications.     Past Medical History:  Diagnosis Date  . Allergic rhinitis   . Anxiety   . Arthritis   . Asthma   . CAD (coronary artery disease)    cath 7/09: pCFX 25%, mRCA 25%, EF 60%;  Myoview 6/11: low risk, EF 67%  . COPD (chronic obstructive pulmonary disease) ([place])    admitted 5/11, uses oxygen 2 liters/min prn rare use  . First degree AV block   . GERD (gastroesophageal reflux disease)   . Heart murmur   . Hiatal hernia   . History of TIAs   . Hyperlipidemia   . Hypertension   . Paroxysmal atrial fibrillation (HCC)    coumadin therapy;  echo 1/10: EF 65-70%, mild diast dysfxn  . Pneumonia    hx of   . Sebaceous cyst    scalp  . Shortness of breath dyspnea    with exertion   . SVT (supraventricular tachycardia) ([place])    s/p RFCA 05/2010  . Tubular adenoma 2014   Dr. Hilarie Fredrickson       Review of Systems  Constitutional: Negative for chills, fatigue and fever.  HENT: Positive for rhinorrhea. Negative for postnasal drip and sinus pressure.   Respiratory: Positive for cough, shortness of breath  and wheezing.   Cardiovascular: Negative for chest pain, palpitations and leg swelling.       Objective:   Physical Exam Vitals:   04/05/17 1414  BP: 136/84  Pulse: 78  SpO2: 96%  Weight: 199 lb 3.2 oz (90.4 kg)  Height: 5\' 11"  (1.803 m)   RA  Gen: chronically ill appearing HENT: OP clear, TM's clear, neck supple PULM: Wheezing upper lobes bilaterally, normal percussion CV: RRR, no mgr, trace edema GI: BS+, soft, nontender Derm: no cyanosis or rash Psyche: normal mood and affect    CBC    Component Value Date/Time   WBC 10.0 03/13/2015 0524   RBC 4.12 (L) 03/13/2015 0524   HGB 12.3 (L) 03/13/2015 0524   HCT 37.5 (L) 03/13/2015 0524   PLT 191 03/13/2015 0524   MCV 91.0 03/13/2015 0524   MCV 87.0 07/15/2013 1425   MCH 29.9 03/13/2015 0524   MCHC 32.8 03/13/2015 0524   RDW 13.8 03/13/2015 0524   LYMPHSABS 2.6 03/04/2015 0840   MONOABS 0.8 03/04/2015 0840   EOSABS 0.7 03/04/2015 0840   BASOSABS 0.0 03/04/2015 0840     CXR January 2017>  hyperinflation and airway thickening     Assessment & Plan:  Mucopurulent chronic bronchitis (HCC)  COPD exacerbation (HCC)  Dyspnea on exertion  Discussion: Unfortunately he's having an acute exacerbation of COPD. This is due to the fact that he has not been taking his controller medications and he's had a recent viral illness. On physical exam today he has wheezing consistent with this. Differential diagnosis includes heart failure versus less likely pneumonia or some sort of malignancy. We'll get a chest x-ray to make sure there is nothing else going on.  Plan: For your COPD exacerbation: Take prednisone 20 mg daily 5 days We will check a chest x-ray you with the results Restart Symbicort, 2 puffs twice a day no matter how you feel Use Ventolin as needed for chest tightness or shortness of breath  Come back and see our nurse practitioner in 6 weeks or sooner if needed   Current Outpatient Prescriptions:  .  Calcium  Carb-Cholecalciferol (CALCIUM 600 + D PO), Take 1 tablet by mouth every morning., Disp: , Rfl:  .  ipratropium-albuterol (DUONEB) 0.5-2.5 (3) MG/3ML SOLN, Take 39mls through nebulizer 4 times a day., Disp: 360 mL, Rfl: 0 .  magnesium oxide (MAG-OX) 400 MG tablet, Take 400 mg by mouth every morning. , Disp: , Rfl:  .  Misc. Devices (ACAPELLA) MISC, As directed, Disp: 1 each, Rfl: 0 .  multivitamin-iron-minerals-folic acid (CENTRUM) chewable tablet, Chew 1 tablet by mouth every morning. , Disp: , Rfl:  .  OVER THE COUNTER MEDICATION, Leader allergy tablet chlorpheniramine antihistamine tablet daily, Disp: , Rfl:  .  valsartan (DIOVAN) 160 MG tablet, Take 80 mg by mouth daily., Disp: , Rfl:  .  vitamin B-12 (CYANOCOBALAMIN) 1000 MCG tablet, Take 1,000 mcg by mouth every morning. , Disp: , Rfl:  .  warfarin (COUMADIN) 3 MG tablet, , Disp: , Rfl:  .  albuterol (PROVENTIL HFA;VENTOLIN HFA) 108 (90 Base) MCG/ACT inhaler, Inhale 2 puffs into the lungs every 4 (four) hours as needed for wheezing or shortness of breath. (Patient not taking: Reported on 04/05/2017), Disp: 1 Inhaler, Rfl: 5 .  budesonide-formoterol (SYMBICORT) 160-4.5 MCG/ACT inhaler, Inhale 2 puffs into the lungs 2 (two) times daily. (Patient not taking: Reported on 04/05/2017), Disp: 1 Inhaler, Rfl: 4

## 2017-04-05 NOTE — Addendum Note (Signed)
Addended by: Len Blalock on: 04/05/2017 02:40 PM   Modules accepted: Orders

## 2017-04-06 ENCOUNTER — Telehealth: Payer: Self-pay | Admitting: Pulmonary Disease

## 2017-04-06 NOTE — Telephone Encounter (Signed)
Spoke with pt's wife and advised of CXR results per Dr Lake Bells.  She verbalized understanding.  Nothing further needed.

## 2017-05-17 ENCOUNTER — Encounter: Payer: Self-pay | Admitting: Acute Care

## 2017-05-17 ENCOUNTER — Ambulatory Visit (INDEPENDENT_AMBULATORY_CARE_PROVIDER_SITE_OTHER): Payer: Medicare HMO | Admitting: Acute Care

## 2017-05-17 DIAGNOSIS — J441 Chronic obstructive pulmonary disease with (acute) exacerbation: Secondary | ICD-10-CM

## 2017-05-17 MED ORDER — ALBUTEROL SULFATE HFA 108 (90 BASE) MCG/ACT IN AERS: 2.0000 | INHALATION_SPRAY | RESPIRATORY_TRACT | 2 refills | Status: DC | PRN

## 2017-05-17 MED ORDER — BUDESONIDE-FORMOTEROL FUMARATE 160-4.5 MCG/ACT IN AERO: 2.0000 | INHALATION_SPRAY | Freq: Two times a day (BID) | RESPIRATORY_TRACT | 0 refills | Status: DC

## 2017-05-17 NOTE — Assessment & Plan Note (Addendum)
Resolved COPD flare Compliant with prednisone 20 mg for 5 days Continues to be noncompliant with Symbicort use daily Does not have rescue inhaler/lost it Plan Continued reinforcement and education regarding the use of Symbicort daily as a maintenance medication. Continue taking your Symbicort 2 puffs twice daily every day without fail.  This is your maintenance medication and needs to be taken every day. Rinse mouth after use We will see if we have any samples. Fill out paperwork for assistance with Symbicort cost. Continue Ventolin as needed for breakthrough SOB or wheezing up to every 4 hours. We will write you a prescription for Ventolin Follow up with Judson Roch NP or Dr. Lake Bells in 4-6 months. Note your daily symptoms > remember "red flags" for COPD:  Increase in cough, increase in sputum production, increase in shortness of breath or activity tolerance. If you notice these symptoms, please call to be seen.   Please contact office for sooner follow up if symptoms do not improve or worsen or seek emergency care

## 2017-05-17 NOTE — Patient Instructions (Addendum)
It is nice to see you today. I am glad you are better.  Continue taking your Symbicort 2 puffs twice daily every day without fail.  This is your maintenance medication and needs to be taken every day. Rinse mouth after use We will see if we have any samples. Fill out paperwork for assistance with Symbicort cost. Continue Ventolin as needed for breakthrough SOB or wheezing up to every 4 hours. We will write you a prescription for Ventolin Follow up with Judson Roch NP or Dr. Lake Bells in 4-6 months. Note your daily symptoms > remember "red flags" for COPD:  Increase in cough, increase in sputum production, increase in shortness of breath or activity tolerance. If you notice these symptoms, please call to be seen.   Please contact office for sooner follow up if symptoms do not improve or worsen or seek emergency care

## 2017-05-17 NOTE — Progress Notes (Signed)
History of Present Illness [name] is a 82 y.o. male former smoker with COPD. He is followed by Dr. Lake [name].   05/17/2017 6 week follow up: Pt. Presents for follow up of a COPD exacerbation. He was seen 04/05/2017 for COPD exacerbation.He was treated with a prednisone 20 mg x 5 days, which he said he completed. He states that helped him a lot. He continues to have shortness of breath on exertion, but states he is at his baseline. His secretions are yellow/ white. He has productive cough in the morning and at night. He is back to working 5 days a week for 3 hours a day.Per his wife he is not taking his Symbicort on a regular basis.He is using it as rescue. He has lost his Ventolin inhaler.We spent 10 minutes reviewing the difference between maintenance medications and rescue medications. He denies fever, chest pain, orthopnea or hemoptysis.  Test Results: CXR 04/05/2017: Hyperinflation.  No acute infiltrate or edema.  CBC Latest Ref Rng & Units 03/13/2015 03/12/2015 03/04/2015  WBC 4.0 - 10.5 K/uL 10.0 10.4 8.7  Hemoglobin 13.0 - 17.0 g/dL 12.3(L) 12.0(L) 14.2  Hematocrit 39.0 - 52.0 % 37.5(L) 36.5(L) 43.4  Platelets 150 - 400 K/uL 191 180 189    BMP Latest Ref Rng & Units 03/13/2015 03/12/2015 03/04/2015  Glucose 65 - 99 mg/dL 96 136(H) 85  BUN 6 - 20 mg/dL 22(H) 21(H) 18  Creatinine 0.61 - 1.24 mg/dL 0.84 1.01 0.91  BUN/Creat Ratio 10 - 22 - - -  Sodium 135 - 145 mmol/L 139 137 140  Potassium 3.5 - 5.1 mmol/L 4.4 4.8 4.8  Chloride 101 - 111 mmol/L 102 102 103  CO2 22 - 32 mmol/L 31 28 30   Calcium 8.9 - 10.3 mg/dL 9.0 9.0 9.8    BNP No results found for: BNP  ProBNP    Component Value Date/Time   PROBNP 51.4 09/30/2012 1600    PFT No results found for: FEV1PRE, FEV1POST, FVCPRE, FVCPOST, TLC, DLCOUNC, PREFEV1FVCRT, PSTFEV1FVCRT  No results found.   Past medical hx Past Medical History:  Diagnosis Date  . Allergic rhinitis   . Anxiety   . Arthritis   . Asthma   .  CAD (coronary artery disease)    cath 7/09: pCFX 25%, mRCA 25%, EF 60%;  Myoview 6/11: low risk, EF 67%  . COPD (chronic obstructive pulmonary disease) ([place])    admitted 5/11, uses oxygen 2 liters/min prn rare use  . First degree AV block   . GERD (gastroesophageal reflux disease)   . Heart murmur   . Hiatal hernia   . History of TIAs   . Hyperlipidemia   . Hypertension   . Paroxysmal atrial fibrillation (HCC)    coumadin therapy;  echo 1/10: EF 65-70%, mild diast dysfxn  . Pneumonia    hx of   . Sebaceous cyst    scalp  . Shortness of breath dyspnea    with exertion   . SVT (supraventricular tachycardia) ([place])    s/p RFCA 05/2010  . Tubular adenoma 2014   Dr. Hilarie [name]     Social History  Substance Use Topics  . Smoking status: Former Smoker    Packs/day: 3.00    Years: 40.00    Types: Cigarettes    Quit date: 09/13/1995  . Smokeless tobacco: Never Used  . Alcohol use No    [name] reports that he quit smoking about 21 years ago. His smoking use included Cigarettes. He has a 120.00  pack-year smoking history. He has never used smokeless tobacco. He reports that he does not drink alcohol or use drugs.  Tobacco Cessation: Former smoker quit 1997 with a 120-pack-year smoking history  Past surgical hx, Family hx, Social hx all reviewed.  Current Outpatient Prescriptions on File Prior to Visit  Medication Sig  . Calcium Carb-Cholecalciferol (CALCIUM 600 + D PO) Take 1 tablet by mouth every morning.  Marland Kitchen ipratropium-albuterol (DUONEB) 0.5-2.5 (3) MG/3ML SOLN Take 80mls through nebulizer 4 times a day.  . magnesium oxide (MAG-OX) 400 MG tablet Take 400 mg by mouth every morning.   . Misc. Devices ([place]) MISC As directed  . multivitamin-iron-minerals-folic acid (CENTRUM) chewable tablet Chew 1 tablet by mouth every morning.   Marland Kitchen OVER THE COUNTER MEDICATION Leader allergy tablet chlorpheniramine antihistamine tablet daily  . vitamin B-12 (CYANOCOBALAMIN) 1000 MCG tablet Take  1,000 mcg by mouth every morning.   . warfarin (COUMADIN) 3 MG tablet   . [DISCONTINUED] atorvastatin (LIPITOR) 20 MG tablet Take 20 mg by mouth daily.    . [DISCONTINUED] clonazePAM (KLONOPIN) 0.5 MG tablet Take 0.5 mg by mouth at bedtime as needed. For sleep   . [DISCONTINUED] furosemide (LASIX) 20 MG tablet Take 20 mg by mouth daily.     No current facility-administered medications on file prior to visit.      No Known Allergies  Review Of Systems:  Constitutional:   No  weight loss, night sweats,  Fevers, chills, fatigue, or  lassitude.  HEENT:   No headaches,  Difficulty swallowing,  Tooth/dental problems, or  Sore throat,                No sneezing, itching, ear ache, nasal congestion, post nasal drip,   CV:  No chest pain,  Orthopnea, PND, swelling in lower extremities, anasarca, dizziness, palpitations, syncope.   GI  No heartburn, indigestion, abdominal pain, nausea, vomiting, diarrhea, change in bowel habits, loss of appetite, bloody stools.   Resp: + baseline  shortness of breath with exertion or at rest.  No excess mucus, + baseline productive cough,  No non-productive cough,  No coughing up of blood.  No change in color of mucus.  Rare wheezing.  No chest wall deformity  Skin: no rash or lesions.  GU: no dysuria, change in color of urine, no urgency or frequency.  No flank pain, no hematuria   MS:  No joint pain or swelling.  No decreased range of motion.  No back pain.  Psych:  No change in mood or affect. No depression or anxiety.  No memory loss.   Vital Signs BP 128/66 (BP Location: Right Arm, Cuff Size: Normal)   Pulse 72   Ht 5\' 11"  (1.803 m)   Wt 200 lb 6.4 oz (90.9 kg)   SpO2 92%   BMI 27.95 kg/m    Physical Exam:  General- No distress,  A&Ox3, pleasant ENT: No sinus tenderness, TM clear, pale nasal mucosa, no oral exudate,no post nasal drip, no LAN Cardiac: S1, S2, regular rate and rhythm, no murmur Chest: Few expiratory no wheeze/ rales/  dullness; no accessory muscle use, no nasal flaring, no sternal retractions Abd.: Soft Non-tender, nondistended Ext: No clubbing cyanosis, edema Neuro:  normal strength, cranial nerves intact, deconditioned Skin: No rashes, warm and dry Psych: normal mood and behavior   Assessment/Plan  COPD exacerbation (HCC) Resolved COPD flare Compliant with prednisone 20 mg for 5 days Continues to be noncompliant with Symbicort use daily Does not have rescue inhaler/lost  it Plan Continued reinforcement and education regarding the use of Symbicort daily as a maintenance medication. Continue taking your Symbicort 2 puffs twice daily every day without fail.  This is your maintenance medication and needs to be taken every day. Rinse mouth after use We will see if we have any samples. Fill out paperwork for assistance with Symbicort cost. Continue Ventolin as needed for breakthrough SOB or wheezing up to every 4 hours. We will write you a prescription for Ventolin Follow up with Judson Roch NP or Dr. Lake [name] in 4-6 months. Note your daily symptoms > remember "red flags" for COPD:  Increase in cough, increase in sputum production, increase in shortness of breath or activity tolerance. If you notice these symptoms, please call to be seen.   Please contact office for sooner follow up if symptoms do not improve or worsen or seek emergency care      Magdalen Spatz, NP 05/17/2017  9:11 PM

## 2017-05-18 NOTE — Progress Notes (Signed)
Reviewed, agree 

## 2017-07-26 ENCOUNTER — Encounter: Payer: Self-pay | Admitting: Pulmonary Disease

## 2017-07-26 ENCOUNTER — Ambulatory Visit: Payer: Medicare HMO | Admitting: Pulmonary Disease

## 2017-07-26 VITALS — BP 148/76 | HR 86 | Ht 71.0 in | Wt 200.0 lb

## 2017-07-26 DIAGNOSIS — J441 Chronic obstructive pulmonary disease with (acute) exacerbation: Secondary | ICD-10-CM | POA: Diagnosis not present

## 2017-07-26 MED ORDER — PREDNISONE 20 MG PO TABS: 20.0000 mg | ORAL_TABLET | Freq: Every day | ORAL | 0 refills | Status: DC

## 2017-07-26 MED ORDER — BUDESONIDE-FORMOTEROL FUMARATE 160-4.5 MCG/ACT IN AERO: 2.0000 | INHALATION_SPRAY | Freq: Two times a day (BID) | RESPIRATORY_TRACT | 11 refills | Status: DC

## 2017-07-26 MED ORDER — BUDESONIDE-FORMOTEROL FUMARATE 160-4.5 MCG/ACT IN AERO: 2.0000 | INHALATION_SPRAY | Freq: Two times a day (BID) | RESPIRATORY_TRACT | 0 refills | Status: DC

## 2017-07-26 MED ORDER — ALBUTEROL SULFATE HFA 108 (90 BASE) MCG/ACT IN AERS: 2.0000 | INHALATION_SPRAY | RESPIRATORY_TRACT | 2 refills | Status: AC | PRN

## 2017-07-26 NOTE — Patient Instructions (Signed)
COPD exacerbation: Prednisone 20 mg daily times 5 days Take Symbicort 2 puffs twice a day no matter how you feel Use the albuterol every 4-6 hours as needed for chest tightness wheezing or shortness of breath We will see you back in 3 months or sooner if needed

## 2017-07-26 NOTE — Progress Notes (Deleted)
Subjective:    Patient ID: [name], male    DOB: [birth year], 82 y.o.   MRN: [id number]  Synopsis: Former patient of Dr. Gwenette Greet with COPD   HPI No chief complaint on file.  ***   Past Medical History:  Diagnosis Date  . Allergic rhinitis   . Anxiety   . Arthritis   . Asthma   . CAD (coronary artery disease)    cath 7/09: pCFX 25%, mRCA 25%, EF 60%;  Myoview 6/11: low risk, EF 67%  . COPD (chronic obstructive pulmonary disease) ([place])    admitted 5/11, uses oxygen 2 liters/min prn rare use  . First degree AV block   . GERD (gastroesophageal reflux disease)   . Heart murmur   . Hiatal hernia   . History of TIAs   . Hyperlipidemia   . Hypertension   . Paroxysmal atrial fibrillation (HCC)    coumadin therapy;  echo 1/10: EF 65-70%, mild diast dysfxn  . Pneumonia    hx of   . Sebaceous cyst    scalp  . Shortness of breath dyspnea    with exertion   . SVT (supraventricular tachycardia) ([place])    s/p RFCA 05/2010  . Tubular adenoma 2014   Dr. Hilarie Fredrickson       Review of Systems  Constitutional: Negative for chills, fatigue and fever.  HENT: Positive for rhinorrhea. Negative for postnasal drip and sinus pressure.   Respiratory: Positive for cough, shortness of breath and wheezing.   Cardiovascular: Negative for chest pain, palpitations and leg swelling.       Objective:   Physical Exam There were no vitals filed for this visit. RA  ***    CBC    Component Value Date/Time   WBC 10.0 03/13/2015 0524   RBC 4.12 (L) 03/13/2015 0524   HGB 12.3 (L) 03/13/2015 0524   HCT 37.5 (L) 03/13/2015 0524   PLT 191 03/13/2015 0524   MCV 91.0 03/13/2015 0524   MCV 87.0 07/15/2013 1425   MCH 29.9 03/13/2015 0524   MCHC 32.8 03/13/2015 0524   RDW 13.8 03/13/2015 0524   LYMPHSABS 2.6 03/04/2015 0840   MONOABS 0.8 03/04/2015 0840   EOSABS 0.7 03/04/2015 0840   BASOSABS 0.0 03/04/2015 0840   PFT PFT's 11/24/2011  FEV1  1.97 (66%) ratio 55 and no better with B2,  dlco 94% [name] 01/2015:  FEV1 1.16 (34%), ratio 55, low FVC probably due to airtrapping.   CXR January 2017> hyperinflation and airway thickening     Assessment & Plan:  No diagnosis found.  ***   Current Outpatient Medications:  .  albuterol (PROVENTIL HFA;VENTOLIN HFA) 108 (90 Base) MCG/ACT inhaler, Inhale 2 puffs into the lungs every 4 (four) hours as needed for wheezing or shortness of breath., Disp: 1 Inhaler, Rfl: 2 .  budesonide-formoterol (SYMBICORT) 160-4.5 MCG/ACT inhaler, Inhale 2 puffs into the lungs 2 (two) times daily., Disp: 2 Inhaler, Rfl: 0 .  Calcium Carb-Cholecalciferol (CALCIUM 600 + D PO), Take 1 tablet by mouth every morning., Disp: , Rfl:  .  ipratropium-albuterol (DUONEB) 0.5-2.5 (3) MG/3ML SOLN, Take 82mls through nebulizer 4 times a day., Disp: 360 mL, Rfl: 0 .  magnesium oxide (MAG-OX) 400 MG tablet, Take 400 mg by mouth every morning. , Disp: , Rfl:  .  Misc. Devices (ACAPELLA) MISC, As directed, Disp: 1 each, Rfl: 0 .  multivitamin-iron-minerals-folic acid (CENTRUM) chewable tablet, Chew 1 tablet by mouth every morning. , Disp: , Rfl:  .  OVER THE COUNTER MEDICATION, Leader allergy tablet chlorpheniramine antihistamine tablet daily, Disp: , Rfl:  .  vitamin B-12 (CYANOCOBALAMIN) 1000 MCG tablet, Take 1,000 mcg by mouth every morning. , Disp: , Rfl:  .  warfarin (COUMADIN) 3 MG tablet, , Disp: , Rfl:

## 2017-07-26 NOTE — Progress Notes (Signed)
Subjective:    Patient ID: [name], male    DOB: 29-Sep-1934, 82 y.o.   MRN: [id number]  Synopsis: Former patient of Dr. Gwenette Greet with COPD PFT's 11/24/2011  FEV1  1.97 (66%) ratio 55 and no better with B2, dlco 94% [name] 01/2015:  FEV1 1.16 (34%), ratio 55, low FVC probably due to airtrapping.   HPI Chief Complaint  Patient presents with  . Acute Visit    pt c/o sinus congestion, pnd, prod cough with clear/white mucus X3 days.     [name] has been feeling poorly for 2-3 days.  He has trouble breathing and a severe cough.  The cough episodes are associated with mild mucus production.  No fever or chills.  He has a little sinus congestion. No ithcy eyes or scratchy throat.  No sick contacts.   He hasn't been taking the Symbicort for at least 3-4 weeks. He says its expensive so he often will intentionally not pick it up.   He has not been using the albuterol.    Past Medical History:  Diagnosis Date  . Allergic rhinitis   . Anxiety   . Arthritis   . Asthma   . CAD (coronary artery disease)    cath 7/09: pCFX 25%, mRCA 25%, EF 60%;  Myoview 6/11: low risk, EF 67%  . COPD (chronic obstructive pulmonary disease) ([place])    admitted 5/11, uses oxygen 2 liters/min prn rare use  . First degree AV block   . GERD (gastroesophageal reflux disease)   . Heart murmur   . Hiatal hernia   . History of TIAs   . Hyperlipidemia   . Hypertension   . Paroxysmal atrial fibrillation (HCC)    coumadin therapy;  echo 1/10: EF 65-70%, mild diast dysfxn  . Pneumonia    hx of   . Sebaceous cyst    scalp  . Shortness of breath dyspnea    with exertion   . SVT (supraventricular tachycardia) ([place])    s/p RFCA 05/2010  . Tubular adenoma 2014   Dr. Hilarie Fredrickson       Review of Systems  Constitutional: Negative for chills, fatigue and fever.  HENT: Positive for rhinorrhea. Negative for postnasal drip and sinus pressure.   Respiratory: Positive for cough, shortness of breath and wheezing.     Cardiovascular: Negative for chest pain, palpitations and leg swelling.       Objective:   Physical Exam Vitals:   07/26/17 1132  BP: (!) 148/76  Pulse: 86  SpO2: 95%  Weight: 200 lb (90.7 kg)  Height: 5\' 11"  (1.803 m)   RA  Gen: chronically ill appearing HENT: OP clear, TM's clear, neck supple PULM: Wheezing B, normal percussion CV: RRR, no mgr, trace edema GI: BS+, soft, nontender Derm: no cyanosis or rash Psyche: normal mood and affect   Records from his recent visit with our NP reviewed, he was encouraged to use Sybmicort routinely    CBC    Component Value Date/Time   WBC 10.0 03/13/2015 0524   RBC 4.12 (L) 03/13/2015 0524   HGB 12.3 (L) 03/13/2015 0524   HCT 37.5 (L) 03/13/2015 0524   PLT 191 03/13/2015 0524   MCV 91.0 03/13/2015 0524   MCV 87.0 07/15/2013 1425   MCH 29.9 03/13/2015 0524   MCHC 32.8 03/13/2015 0524   RDW 13.8 03/13/2015 0524   LYMPHSABS 2.6 03/04/2015 0840   MONOABS 0.8 03/04/2015 0840   EOSABS 0.7 03/04/2015 0840   BASOSABS 0.0 03/04/2015 0840  CXR January 2017> hyperinflation and airway thickening CXR 03/2017 > hyperinflation, no other abnormality     Assessment & Plan:  COPD exacerbation ([place])  Discussion: Unfortunately he is having another exacerbation of COPD.  This is solely due to the fact that he frequently forgets to use his Symbicort.  We discussed this once again today, probably for the fifth time since I have taking care of him over the last couple years.  I do not think we need to do much more of a workup other than just try to work with him to encourage him to take his medicines regularly.  His wife says she is going to try to help him with that today.  Plan: COPD exacerbation: Prednisone 20 mg daily times 5 days Take Symbicort 2 puffs twice a day no matter how you feel Use the albuterol every 4-6 hours as needed for chest tightness wheezing or shortness of breath We will see you back in 3 months or sooner if  needed   Current Outpatient Medications:  .  albuterol (PROVENTIL HFA;VENTOLIN HFA) 108 (90 Base) MCG/ACT inhaler, Inhale 2 puffs into the lungs every 4 (four) hours as needed for wheezing or shortness of breath., Disp: 1 Inhaler, Rfl: 2 .  budesonide-formoterol (SYMBICORT) 160-4.5 MCG/ACT inhaler, Inhale 2 puffs into the lungs 2 (two) times daily., Disp: 2 Inhaler, Rfl: 0 .  Calcium Carb-Cholecalciferol (CALCIUM 600 + D PO), Take 1 tablet by mouth every morning., Disp: , Rfl:  .  ipratropium-albuterol (DUONEB) 0.5-2.5 (3) MG/3ML SOLN, Take 83mls through nebulizer 4 times a day., Disp: 360 mL, Rfl: 0 .  magnesium oxide (MAG-OX) 400 MG tablet, Take 400 mg by mouth every morning. , Disp: , Rfl:  .  Misc. Devices (ACAPELLA) MISC, As directed, Disp: 1 each, Rfl: 0 .  multivitamin-iron-minerals-folic acid (CENTRUM) chewable tablet, Chew 1 tablet by mouth every morning. , Disp: , Rfl:  .  OVER THE COUNTER MEDICATION, Leader allergy tablet chlorpheniramine antihistamine tablet daily, Disp: , Rfl:  .  vitamin B-12 (CYANOCOBALAMIN) 1000 MCG tablet, Take 1,000 mcg by mouth every morning. , Disp: , Rfl:  .  warfarin (COUMADIN) 3 MG tablet, , Disp: , Rfl:

## 2017-07-31 ENCOUNTER — Encounter: Payer: Self-pay | Admitting: Internal Medicine

## 2017-08-10 ENCOUNTER — Encounter: Payer: Self-pay | Admitting: Internal Medicine

## 2017-08-10 ENCOUNTER — Ambulatory Visit: Payer: Medicare HMO | Admitting: Internal Medicine

## 2017-08-10 ENCOUNTER — Encounter (INDEPENDENT_AMBULATORY_CARE_PROVIDER_SITE_OTHER): Payer: Self-pay

## 2017-08-10 VITALS — BP 144/82 | HR 98 | Ht 71.5 in | Wt 195.8 lb

## 2017-08-10 DIAGNOSIS — I5032 Chronic diastolic (congestive) heart failure: Secondary | ICD-10-CM | POA: Diagnosis not present

## 2017-08-10 DIAGNOSIS — I480 Paroxysmal atrial fibrillation: Secondary | ICD-10-CM | POA: Diagnosis not present

## 2017-08-10 NOTE — Progress Notes (Signed)
Patient Care Team: Graylon Good, MD as PCP - General (Family Medicine)   HPI  [name] is a 82 y.o. male Seen in followup for  atrial fibrillation requiring cardioversion in 2009. He   has maintained SR since and is chronically anticoagulated with Warfarin. He also has COPD and was previously oxygen dependent. Since last being seen in our clinic, the patient reports doing very well. He feels that he is maintaining SR. He denies chest pain, palpitations, dyspnea, PND, orthopnea, nausea, vomiting, dizziness, syncope. He has intercurrently undergone bilateral knee replacement with a significant improvement in his activity level.   Echocardiogram 01/2015 demonstrated EF 55-60%, no clear RWMA, LA 32.    Date Hgb Cr TSH  6/18 15 0.92  0.486         He has minimal shortness of breath--no edema Wife is concerned about dementia No bleeding  No palps    Past Medical History:  Diagnosis Date  . Allergic rhinitis   . Anxiety   . Arthritis   . Asthma   . CAD (coronary artery disease)    cath 7/09: pCFX 25%, mRCA 25%, EF 60%;  Myoview 6/11: low risk, EF 67%  . COPD (chronic obstructive pulmonary disease) ([place])    admitted 5/11, uses oxygen 2 liters/min prn rare use  . First degree AV block   . GERD (gastroesophageal reflux disease)   . Heart murmur   . Hiatal hernia   . History of TIAs   . Hyperlipidemia   . Hypertension   . Paroxysmal atrial fibrillation (HCC)    coumadin therapy;  echo 1/10: EF 65-70%, mild diast dysfxn  . Pneumonia    hx of   . Sebaceous cyst    scalp  . Shortness of breath dyspnea    with exertion   . SVT (supraventricular tachycardia) ([place])    s/p RFCA 05/2010  . Tubular adenoma 2014   Dr. Hilarie Fredrickson    Past Surgical History:  Procedure Laterality Date  . BACK SURGERY  1980   lower L 4 to L 5  . CARDIAC ELECTROPHYSIOLOGY MAPPING AND ABLATION  2008   Dr Caryl Comes - details not avaialble in EPIC  . CARDIOVERSION  2009   Dr Percival Spanish  .  COLONOSCOPY N/A 02/07/2013   Procedure: COLONOSCOPY;  Surgeon: Jerene Bears, MD;  Location: [facility];  Service: Gastroenterology;  Laterality: N/A;  . CYSTECTOMY  2012   removed from head  . ESOPHAGOGASTRODUODENOSCOPY N/A 02/07/2013   Procedure: ESOPHAGOGASTRODUODENOSCOPY (EGD);  Surgeon: Jerene Bears, MD;  Location: [facility];  Service: Gastroenterology;  Laterality: N/A;  . INGUINAL HERNIA REPAIR Left 04/15/2013   Procedure: LEFT HERNIA REPAIR INGUINAL INCARCERATED;  Surgeon: Gayland Curry, MD;  Location: [facility];  Service: General;  Laterality: Left;  . INSERTION OF MESH Left 04/15/2013   Procedure: INSERTION OF MESH;  Surgeon: Gayland Curry, MD;  Location: [facility];  Service: General;  Laterality: Left;  . TOTAL KNEE ARTHROPLASTY Right 02/19/2014   Procedure: RIGHT TOTAL KNEE ARTHROPLASTY;  Surgeon: Tobi Bastos, MD;  Location: [facility];  Service: Orthopedics;  Laterality: Right;  . TOTAL KNEE ARTHROPLASTY Left 03/11/2015   Procedure: LEFT TOTAL KNEE ARTHROPLASTY;  Surgeon: Latanya Maudlin, MD;  Location: [facility];  Service: Orthopedics;  Laterality: Left;    Current Outpatient Medications  Medication Sig Dispense Refill  . albuterol (PROVENTIL HFA;VENTOLIN HFA) 108 (90 Base) MCG/ACT inhaler Inhale 2 puffs every 4 (four) hours as needed  into the lungs for wheezing or shortness of breath. 1 Inhaler 2  . budesonide-formoterol (SYMBICORT) 160-4.5 MCG/ACT inhaler Inhale 2 puffs 2 (two) times daily into the lungs. 1 Inhaler 11  . Calcium Carb-Cholecalciferol (CALCIUM 600 + D PO) Take 1 tablet by mouth every morning.    Marland Kitchen ipratropium-albuterol (DUONEB) 0.5-2.5 (3) MG/3ML SOLN Take 3 mLs by nebulization every 4 (four) hours as needed (shortness of breath/ wheezing).    . magnesium oxide (MAG-OX) 400 MG tablet Take 400 mg by mouth every morning.     . Misc. Devices (ACAPELLA) MISC As directed 1 each 0  . multivitamin-iron-minerals-folic acid (CENTRUM) chewable tablet Chew 1 tablet by mouth every  morning.     Marland Kitchen OVER THE COUNTER MEDICATION Leader allergy tablet chlorpheniramine antihistamine tablet daily    . vitamin B-12 (CYANOCOBALAMIN) 1000 MCG tablet Take 1,000 mcg by mouth every morning.     . warfarin (COUMADIN) 3 MG tablet Take 3 mg by mouth daily.      No current facility-administered medications for this visit.     No Known Allergies    Review of Systems negative except from HPI and PMH  Physical Exam BP (!) 144/82   Pulse 98   Ht 5' 11.5" (1.816 m)   Wt 195 lb 12.8 oz (88.8 kg)   BMI 26.93 kg/m  Well developed and nourished in no acute distress HENT normal Neck supple with JVP-flat Clear Regular rate and rhythm, no murmurs or gallops Abd-soft with active BS No Clubbing cyanosis edema Skin-warm and dry A & Oriented x2  Grossly normal sensory and motor function   ECG demonstrates sinus rhythm at 98 Intervals 19/09/39   Assessment and  Plan Atrial fibrillation  Hypertension  Sinus tachy ( relative)  Dementia  No interval atrial fibrillation of which he is aware  On Anticoagulation;  No bleeding issues   BP reasonably controlled  Relatively rapid heart rate is without clear cause.  At least 6 months ago his hemoglobin was okay and his TSH was also okay.  Recommended that he follow-up with the neurology consultation as scheduled for his dementia.

## 2017-08-10 NOTE — Patient Instructions (Signed)
Medication Instructions: Your physician recommends that you continue on your current medications as directed. Please refer to the Current Medication list given to you today.  Labwork: None Ordered  Procedures/Testing: None Ordered  Follow-Up: Your physician wants you to follow-up in 1 YEAR with Dr. Klein. You will receive a reminder letter in the mail two months in advance. If you don't receive a letter, please call our office to schedule the follow-up appointment.   If you need a refill on your cardiac medications before your next appointment, please call your pharmacy.   

## 2018-07-22 NOTE — Progress Notes (Signed)
@Patient  ID: [name], male    DOB: 1934/10/06, 83 y.o.   MRN: [id number]  No chief complaint on file.   Referring provider: Graylon Good, MD  HPI:  83 year old male former smoker followed in our office for COPD  PMH: Hypothyroidism, GERD, allergic rhinitis Smoker/ Smoking History: Former smoker.  120-pack-year smoking history.  Quit 1997. Maintenance: Symbicort 160 Pt of: [place]   07/23/2018  - Visit   83 year old male patient with [place] for COPD presenting today for follow-up visit.  Patient was recently seen by primary care last week.  Patient was started on doxycycline, oxygen since.  Patient and spouse report today that they are feeling much better.  She has been using duo nebs every 6 hours as instructed by primary care.  Patient arrived to our office on room air.  Oxygen saturations 97% on room air.  Patient reports to have a been using her oxygen regularly at home.  Patient and spouse report "somewhat" adherence to Symbicort.  Per chart review this continues to be an issue.  Patient and spouse both report that they knew they were supposed to follow-up in 3 months with Dr. Lake Bells.  They reported that they did not receive a flyer or call to schedule the appointment so they never scheduled it.  MMRC - Breathlessness Score 3 - I stop for breath after walking about 100 yards or after a few minutes on level ground (isle at grocery store is 163ft)      Tests:  01/23/2015-spirometry- FVC 2.1 (47% predicted), ratio 55, FEV1 34% >>>Moderate airflow obstruction  01/19/2015-echocardiogram-LV ejection fraction 55 to 60%,  FENO:  No results found for: NITRICOXIDE  PFT: No flowsheet data found.  Imaging: No results found.  Chart Review:    Specialty Problems      Pulmonary Problems   COPD with emphysema ([place])    PFT's 11/24/2011  FEV1  1.97 (66%) ratio 55 and no better with B2, dlco 94% [name] 01/2015:  FEV1 1.16 (34%), ratio 55, low FVC probably due to  airtrapping.       COPD exacerbation (HCC)   Allergic rhinitis   Chronic respiratory failure with hypoxia (HCC)      No Known Allergies  Immunization History  Administered Date(s) Administered  . Influenza Split 06/13/2011  . Influenza, High Dose Seasonal PF 08/18/2016, 07/12/2017  . Influenza,inj,Quad PF,6+ Mos 07/31/2013, 07/09/2015  . Influenza-Unspecified 06/01/2018  . Pneumococcal Conjugate-13 07/09/2015  . Pneumococcal Polysaccharide-23 06/12/2010    Past Medical History:  Diagnosis Date  . Allergic rhinitis   . Anxiety   . Arthritis   . Asthma   . CAD (coronary artery disease)    cath 7/09: pCFX 25%, mRCA 25%, EF 60%;  Myoview 6/11: low risk, EF 67%  . COPD (chronic obstructive pulmonary disease) ([place])    admitted 5/11, uses oxygen 2 liters/min prn rare use  . First degree AV block   . GERD (gastroesophageal reflux disease)   . Heart murmur   . Hiatal hernia   . History of TIAs   . Hyperlipidemia   . Hypertension   . Paroxysmal atrial fibrillation (HCC)    coumadin therapy;  echo 1/10: EF 65-70%, mild diast dysfxn  . Pneumonia    hx of   . Sebaceous cyst    scalp  . Shortness of breath dyspnea    with exertion   . SVT (supraventricular tachycardia) ([place])    s/p RFCA 05/2010  . Tubular adenoma 2014   Dr.  [name]    Tobacco History: Social History   Tobacco Use  Smoking Status Former Smoker  . Packs/day: 3.00  . Years: 40.00  . Pack years: 120.00  . Types: Cigarettes  . Last attempt to quit: 09/13/1995  . Years since quitting: 22.8  Smokeless Tobacco Never Used   Counseling given: Yes  Continue not smoking  Outpatient Encounter Medications as of 07/23/2018  Medication Sig  . albuterol (PROVENTIL HFA;VENTOLIN HFA) 108 (90 Base) MCG/ACT inhaler Inhale 2 puffs every 4 (four) hours as needed into the lungs for wheezing or shortness of breath.  . budesonide-formoterol (SYMBICORT) 160-4.5 MCG/ACT inhaler Inhale 2 puffs 2 (two) times daily into the  lungs.  . Calcium Carb-Cholecalciferol (CALCIUM 600 + D PO) Take 1 tablet by mouth every morning.  Marland Kitchen ipratropium-albuterol (DUONEB) 0.5-2.5 (3) MG/3ML SOLN Take 3 mLs by nebulization every 4 (four) hours as needed (shortness of breath/ wheezing).  . magnesium oxide (MAG-OX) 400 MG tablet Take 400 mg by mouth every morning.   . Misc. Devices ([place]) MISC As directed  . multivitamin-iron-minerals-folic acid (CENTRUM) chewable tablet Chew 1 tablet by mouth every morning.   Marland Kitchen OVER THE COUNTER MEDICATION Leader allergy tablet chlorpheniramine antihistamine tablet daily  . vitamin B-12 (CYANOCOBALAMIN) 1000 MCG tablet Take 1,000 mcg by mouth every morning.   . warfarin (COUMADIN) 3 MG tablet Take 3 mg by mouth daily.   . [DISCONTINUED] atorvastatin (LIPITOR) 20 MG tablet Take 20 mg by mouth daily.    . [DISCONTINUED] clonazePAM (KLONOPIN) 0.5 MG tablet Take 0.5 mg by mouth at bedtime as needed. For sleep   . [DISCONTINUED] furosemide (LASIX) 20 MG tablet Take 20 mg by mouth daily.     No facility-administered encounter medications on file as of 07/23/2018.      Review of Systems  Review of Systems  Constitutional: Positive for fatigue. Negative for activity change, chills, fever and unexpected weight change.  HENT: Negative for congestion, postnasal drip, rhinorrhea, sinus pressure, sinus pain, sneezing and sore throat.   Eyes: Negative.   Respiratory: Positive for cough (Productive chronic cough of white clear thick mucus , back to baseline now) and shortness of breath (Improving, chronic). Negative for wheezing.   Cardiovascular: Negative for chest pain and palpitations.  Gastrointestinal: Negative for diarrhea, nausea and vomiting.  Endocrine: Negative.   Musculoskeletal: Negative.   Skin: Negative.   Neurological: Negative for dizziness and headaches.  Psychiatric/Behavioral: Negative.  Negative for dysphoric mood. The patient is not nervous/anxious.   All other systems reviewed and  are negative.    Physical Exam  BP 126/84 (BP Location: Left Arm, Patient Position: Sitting, Cuff Size: Normal)   Pulse 66   Ht 5' 11.5" (1.816 m)   Wt 183 lb (83 kg)   SpO2 95%   BMI 25.17 kg/m   Wt Readings from Last 5 Encounters:  07/23/18 183 lb (83 kg)  08/10/17 195 lb 12.8 oz (88.8 kg)  07/26/17 200 lb (90.7 kg)  05/17/17 200 lb 6.4 oz (90.9 kg)  04/05/17 199 lb 3.2 oz (90.4 kg)   RA  Physical Exam  Constitutional: He is oriented to person, place, and time and well-developed, well-nourished, and in no distress. No distress.  HENT:  Head: Normocephalic and atraumatic.  Right Ear: Hearing, tympanic membrane, external ear and ear canal normal.  Left Ear: Hearing, tympanic membrane, external ear and ear canal normal.  Nose: Nose normal. Right sinus exhibits no maxillary sinus tenderness and no frontal sinus tenderness. Left sinus exhibits  no maxillary sinus tenderness and no frontal sinus tenderness.  Mouth/Throat: Uvula is midline and oropharynx is clear and moist. No oropharyngeal exudate.  Eyes: Pupils are equal, round, and reactive to light.  Neck: Normal range of motion. Neck supple.  Cardiovascular: Normal rate, regular rhythm and normal heart sounds.  Pulmonary/Chest: Effort normal and breath sounds normal. No accessory muscle usage. No respiratory distress. He has no decreased breath sounds. He has no wheezes. He has no rhonchi.  Musculoskeletal: Normal range of motion. He exhibits no edema.  Lymphadenopathy:    He has no cervical adenopathy.  Neurological: He is alert and oriented to person, place, and time. Gait normal.  Skin: Skin is warm and dry. He is not diaphoretic. No erythema.  Psychiatric: Mood, memory, affect and judgment normal.  Nursing note and vitals reviewed.   Walk today in office: Patient was able to complete 3 laps on room air did not drop below 95%  Lab Results:  CBC    Component Value Date/Time   WBC 10.0 03/13/2015 0524   RBC 4.12 (L)  03/13/2015 0524   HGB 12.3 (L) 03/13/2015 0524   HCT 37.5 (L) 03/13/2015 0524   PLT 191 03/13/2015 0524   MCV 91.0 03/13/2015 0524   MCV 87.0 07/15/2013 1425   MCH 29.9 03/13/2015 0524   MCHC 32.8 03/13/2015 0524   RDW 13.8 03/13/2015 0524   LYMPHSABS 2.6 03/04/2015 0840   MONOABS 0.8 03/04/2015 0840   EOSABS 0.7 03/04/2015 0840   BASOSABS 0.0 03/04/2015 0840    BMET    Component Value Date/Time   NA 139 03/13/2015 0524   NA 138 07/15/2013 1111   K 4.4 03/13/2015 0524   CL 102 03/13/2015 0524   CO2 31 03/13/2015 0524   GLUCOSE 96 03/13/2015 0524   BUN 22 (H) 03/13/2015 0524   BUN 11 07/15/2013 1111   CREATININE 0.84 03/13/2015 0524   CREATININE 1.01 01/01/2013 1045   CALCIUM 9.0 03/13/2015 0524   GFRNONAA >60 03/13/2015 0524   GFRNONAA 72 01/01/2013 1045   GFRAA >60 03/13/2015 0524   GFRAA 84 01/01/2013 1045    BNP No results found for: BNP  ProBNP    Component Value Date/Time   PROBNP 51.4 09/30/2012 1600      Assessment & Plan:   83 year old male patient completing follow-up with our office today.  Walk today reveals patient did not need oxygen with exertion.  Patient's wife to purchase an SPO2 monitor to monitor oxygen levels at home.  Patient to apply oxygen of oxygen levels drop below 90%.  Patient to follow-up with our office in 2 to 4 weeks we can continue to assess COPD exacerbation in church resolving.  Consider repeat pulmonary function test as last spirometry was in 2016.  Last PFTs was in 2013.  Could further evaluate at least with a spirometry DLCO.  Ensure patient has received flu vaccine at next office visit.  Patient and spouse both concerned about patient's medication adherence.  I agree patient spouse is to visualize patient taking Symbicort each time.  They agree.  Patient spouse also to discuss with primary care continued medication management assistance as well as follow-up potential referral to neurology for culture and suspected Parkinson's per  spouse.  COPD with emphysema ([place]) Walk today in office on Room Air  >>> You maintain 97% or greater on your walk this is good news  Please purchase an SPO2 monitor so you can monitor your oxygen at home Please check your oxygen  levels routinely throughout the day to ensure that your oxygen levels are above 90% If they drop below 90% please apply oxygen  Continue Symbicort 160 >>> 2 puffs in the morning right when you wake up, rinse out your mouth after use, 12 hours later 2 puffs, rinse after use >>> Take this daily, no matter what >>> This is not a rescue inhaler  >>> Make sure that somebody is witnessing you take your Symbicort 2 puffs in the morning as well as 2 puffs in the evening as well as rinsing her mouth out after  Only use your albuterol as a rescue medication to be used if you can't catch your breath by resting or doing a relaxed purse lip breathing pattern.  - The less you use it, the better it will work when you need it. - Ok to use up to 2 puffs  every 4 hours if you must but call for immediate appointment if use goes up over your usual need - Don't leave home without it !!  (think of it like the spare tire for your car)   Duoneb nebulized medication  >>>can use every 6 hours AS NEEDED for shortness of breath   Follow up with primary care about dementia  >>>regarding his dementia  >>>help with medication coordination  Follow-up with our office in 2 to 4 weeks  COPD exacerbation ([place]) Resolving today Continue Symbicort Follow-up in 2 to 4 weeks with our office to ensure still resolving  Chronic respiratory failure with hypoxia ([place]) Improved today Walk today in office on Room Air  >>> You maintain 97% or greater on your walk this is good news  Please purchase an SPO2 monitor so you can monitor your oxygen at home Please check your oxygen levels routinely throughout the day to ensure that your oxygen levels are above 90% If they drop below 90% please apply  oxygen   Follow-up with our office in 2 to 4 weeks  Healthcare maintenance Follow up with primary care about dementia  >>>regarding his dementia  >>>help with medication coordination  Diabetic diet information provided for patient today per patient spouse request Encouraged patient spouse to follow-up with primary care regarding additional information and potential referral to nutritionist  Follow-up with our office in 2 to 4 weeks  Medication management Patient continues to struggle with medication management  Continue Symbicort 160 >>> 2 puffs in the morning right when you wake up, rinse out your mouth after use, 12 hours later 2 puffs, rinse after use >>> Take this daily, no matter what >>> This is not a rescue inhaler  >>> Make sure that somebody is witnessing you take your Symbicort 2 puffs in the morning as well as 2 puffs in the evening as well as rinsing her mouth out after  Only use your albuterol as a rescue medication to be used if you can't catch your breath by resting or doing a relaxed purse lip breathing pattern.  - The less you use it, the better it will work when you need it. - Ok to use up to 2 puffs  every 4 hours if you must but call for immediate appointment if use goes up over your usual need - Don't leave home without it !!  (think of it like the spare tire for your car)   Duoneb nebulized medication  >>>can use every 6 hours AS NEEDED for shortness of breath   Follow up with primary care about dementia  >>>regarding his dementia  >>>  help with medication coordination  Follow-up with our office in 2 to 4 weeks  This appointment was 35 minutes along with over 50% that time direct face-to-face patient care, assessment, plan of care follow-up   Lauraine Rinne, NP 07/23/2018

## 2018-07-23 ENCOUNTER — Encounter: Payer: Self-pay | Admitting: Pulmonary Disease

## 2018-07-23 ENCOUNTER — Ambulatory Visit: Payer: Medicare HMO | Admitting: Pulmonary Disease

## 2018-07-23 VITALS — BP 126/84 | HR 66 | Ht 71.5 in | Wt 183.0 lb

## 2018-07-23 DIAGNOSIS — J9611 Chronic respiratory failure with hypoxia: Secondary | ICD-10-CM

## 2018-07-23 DIAGNOSIS — J441 Chronic obstructive pulmonary disease with (acute) exacerbation: Secondary | ICD-10-CM | POA: Diagnosis not present

## 2018-07-23 DIAGNOSIS — J438 Other emphysema: Secondary | ICD-10-CM | POA: Diagnosis not present

## 2018-07-23 DIAGNOSIS — Z79899 Other long term (current) drug therapy: Secondary | ICD-10-CM

## 2018-07-23 DIAGNOSIS — Z0000 Encounter for general adult medical examination without abnormal findings: Secondary | ICD-10-CM | POA: Diagnosis not present

## 2018-07-23 MED ORDER — BUDESONIDE-FORMOTEROL FUMARATE 160-4.5 MCG/ACT IN AERO: 2.0000 | INHALATION_SPRAY | Freq: Two times a day (BID) | RESPIRATORY_TRACT | 0 refills | Status: DC

## 2018-07-23 NOTE — Assessment & Plan Note (Signed)
Patient continues to struggle with medication management  Continue Symbicort 160 >>> 2 puffs in the morning right when you wake up, rinse out your mouth after use, 12 hours later 2 puffs, rinse after use >>> Take this daily, no matter what >>> This is not a rescue inhaler  >>> Make sure that somebody is witnessing you take your Symbicort 2 puffs in the morning as well as 2 puffs in the evening as well as rinsing her mouth out after  Only use your albuterol as a rescue medication to be used if you can't catch your breath by resting or doing a relaxed purse lip breathing pattern.  - The less you use it, the better it will work when you need it. - Ok to use up to 2 puffs  every 4 hours if you must but call for immediate appointment if use goes up over your usual need - Don't leave home without it !!  (think of it like the spare tire for your car)   Duoneb nebulized medication  >>>can use every 6 hours AS NEEDED for shortness of breath   Follow up with primary care about dementia  >>>regarding his dementia  >>>help with medication coordination  Follow-up with our office in 2 to 4 weeks

## 2018-07-23 NOTE — Addendum Note (Signed)
Addended by: Shirlee More R on: 07/23/2018 01:55 PM   Modules accepted: Orders

## 2018-07-23 NOTE — Assessment & Plan Note (Signed)
Walk today in office on Room Air  >>> You maintain 97% or greater on your walk this is good news  Please purchase an SPO2 monitor so you can monitor your oxygen at home Please check your oxygen levels routinely throughout the day to ensure that your oxygen levels are above 90% If they drop below 90% please apply oxygen  Continue Symbicort 160 >>> 2 puffs in the morning right when you wake up, rinse out your mouth after use, 12 hours later 2 puffs, rinse after use >>> Take this daily, no matter what >>> This is not a rescue inhaler  >>> Make sure that somebody is witnessing you take your Symbicort 2 puffs in the morning as well as 2 puffs in the evening as well as rinsing her mouth out after  Only use your albuterol as a rescue medication to be used if you can't catch your breath by resting or doing a relaxed purse lip breathing pattern.  - The less you use it, the better it will work when you need it. - Ok to use up to 2 puffs  every 4 hours if you must but call for immediate appointment if use goes up over your usual need - Don't leave home without it !!  (think of it like the spare tire for your car)   Duoneb nebulized medication  >>>can use every 6 hours AS NEEDED for shortness of breath   Follow up with primary care about dementia  >>>regarding his dementia  >>>help with medication coordination  Follow-up with our office in 2 to 4 weeks

## 2018-07-23 NOTE — Patient Instructions (Addendum)
Walk today in office on Room Air  >>> You maintain 97% or greater on your walk this is good news  Please purchase an SPO2 monitor so you can monitor your oxygen at home Please check your oxygen levels routinely throughout the day to ensure that your oxygen levels are above 90% If they drop below 90% please apply oxygen  Continue Symbicort 160 >>> 2 puffs in the morning right when you wake up, rinse out your mouth after use, 12 hours later 2 puffs, rinse after use >>> Take this daily, no matter what >>> This is not a rescue inhaler  >>> Make sure that somebody is witnessing you take your Symbicort 2 puffs in the morning as well as 2 puffs in the evening as well as rinsing her mouth out after  Only use your albuterol as a rescue medication to be used if you can't catch your breath by resting or doing a relaxed purse lip breathing pattern.  - The less you use it, the better it will work when you need it. - Ok to use up to 2 puffs  every 4 hours if you must but call for immediate appointment if use goes up over your usual need - Don't leave home without it !!  (think of it like the spare tire for your car)   Duoneb nebulized medication  >>>can use every 6 hours AS NEEDED for shortness of breath   Follow up with primary care about dementia  >>>regarding his dementia  >>>help with medication coordination  Follow-up with our office in 2 to 4 weeks      November/2019 we will be moving! We will no longer be at our [place] location.  Be on the look out for a post card/mailer to let you know we have officially moved.  Our new address and phone number will be:  [address], [place] [address] Telephone number: [phone]  It is flu season:   >>>Remember to be washing your hands regularly, using hand sanitizer, be careful to use around herself with has contact with people who are sick will increase her chances of getting sick yourself. >>> Best ways to protect herself  from the flu: Receive the yearly flu vaccine, practice good hand hygiene washing with soap and also using hand sanitizer when available, eat a nutritious meals, get adequate rest, hydrate appropriately   Please contact the office if your symptoms worsen or you have concerns that you are not improving.   Thank you for choosing [facility] for your healthcare, and for allowing [place] to partner with you on your healthcare journey. I am thankful to be able to provide care to you today.   Wyn Quaker FNP-C   Diabetes Mellitus and Nutrition When you have diabetes (diabetes mellitus), it is very important to have healthy eating habits because your blood sugar (glucose) levels are greatly affected by what you eat and drink. Eating healthy foods in the appropriate amounts, at about the same times every day, can help you:  Control your blood glucose.  Lower your risk of heart disease.  Improve your blood pressure.  Reach or maintain a healthy weight.  Every person with diabetes is different, and each person has different needs for a meal plan. Your health care provider may recommend that you work with a diet and nutrition specialist (dietitian) to make a meal plan that is best for you. Your meal plan may vary depending on factors such as:  The  calories you need.  The medicines you take.  Your weight.  Your blood glucose, blood pressure, and cholesterol levels.  Your activity level.  Other health conditions you have, such as heart or kidney disease.  How do carbohydrates affect me? Carbohydrates affect your blood glucose level more than any other type of food. Eating carbohydrates naturally increases the amount of glucose in your blood. Carbohydrate counting is a method for keeping track of how many carbohydrates you eat. Counting carbohydrates is important to keep your blood glucose at a healthy level, especially if you use insulin or take certain oral diabetes medicines. It is  important to know how many carbohydrates you can safely have in each meal. This is different for every person. Your dietitian can help you calculate how many carbohydrates you should have at each meal and for snack. Foods that contain carbohydrates include:  Bread, cereal, rice, pasta, and crackers.  Potatoes and corn.  Peas, beans, and lentils.  Milk and yogurt.  Fruit and juice.  Desserts, such as cakes, cookies, ice cream, and candy.  How does alcohol affect me? Alcohol can cause a sudden decrease in blood glucose (hypoglycemia), especially if you use insulin or take certain oral diabetes medicines. Hypoglycemia can be a life-threatening condition. Symptoms of hypoglycemia (sleepiness, dizziness, and confusion) are similar to symptoms of having too much alcohol. If your health care provider says that alcohol is safe for you, follow these guidelines:  Limit alcohol intake to no more than 1 drink per day for nonpregnant women and 2 drinks per day for men. One drink equals 12 oz of beer, 5 oz of wine, or 1 oz of hard liquor.  Do not drink on an empty stomach.  Keep yourself hydrated with water, diet soda, or unsweetened iced tea.  Keep in mind that regular soda, juice, and other mixers may contain a lot of sugar and must be counted as carbohydrates.  What are tips for following this plan? Reading food labels  Start by checking the serving size on the label. The amount of calories, carbohydrates, fats, and other nutrients listed on the label are based on one serving of the food. Many foods contain more than one serving per package.  Check the total grams (g) of carbohydrates in one serving. You can calculate the number of servings of carbohydrates in one serving by dividing the total carbohydrates by 15. For example, if a food has 30 g of total carbohydrates, it would be equal to 2 servings of carbohydrates.  Check the number of grams (g) of saturated and trans fats in one serving.  Choose foods that have low or no amount of these fats.  Check the number of milligrams (mg) of sodium in one serving. Most people should limit total sodium intake to less than 2,300 mg per day.  Always check the nutrition information of foods labeled as "low-fat" or "nonfat". These foods may be higher in added sugar or refined carbohydrates and should be avoided.  Talk to your dietitian to identify your daily goals for nutrients listed on the label. Shopping  Avoid buying canned, premade, or processed foods. These foods tend to be high in fat, sodium, and added sugar.  Shop around the outside edge of the grocery store. This includes fresh fruits and vegetables, bulk grains, fresh meats, and fresh dairy. Cooking  Use low-heat cooking methods, such as baking, instead of high-heat cooking methods like deep frying.  Cook using healthy oils, such as olive, canola, or sunflower oil.  Avoid cooking with butter, cream, or high-fat meats. Meal planning  Eat meals and snacks regularly, preferably at the same times every day. Avoid going long periods of time without eating.  Eat foods high in fiber, such as fresh fruits, vegetables, beans, and whole grains. Talk to your dietitian about how many servings of carbohydrates you can eat at each meal.  Eat 4-6 ounces of lean protein each day, such as lean meat, chicken, fish, eggs, or tofu. 1 ounce is equal to 1 ounce of meat, chicken, or fish, 1 egg, or 1/4 cup of tofu.  Eat some foods each day that contain healthy fats, such as avocado, nuts, seeds, and fish. Lifestyle   Check your blood glucose regularly.  Exercise at least 30 minutes 5 or more days each week, or as told by your health care provider.  Take medicines as told by your health care provider.  Do not use any products that contain nicotine or tobacco, such as cigarettes and e-cigarettes. If you need help quitting, ask your health care provider.  Work with a Social worker or diabetes  educator to identify strategies to manage stress and any emotional and social challenges. What are some questions to ask my health care provider?  Do I need to meet with a diabetes educator?  Do I need to meet with a dietitian?  What number can I call if I have questions?  When are the best times to check my blood glucose? Where to find more information:  American Diabetes Association: diabetes.org/food-and-fitness/food  Academy of Nutrition and Dietetics: PokerClues.dk  [employer] of Diabetes and Digestive and Kidney Diseases (NIH): ContactWire.be Summary  A healthy meal plan will help you control your blood glucose and maintain a healthy lifestyle.  Working with a diet and nutrition specialist (dietitian) can help you make a meal plan that is best for you.  Keep in mind that carbohydrates and alcohol have immediate effects on your blood glucose levels. It is important to count carbohydrates and to use alcohol carefully. This information is not intended to replace advice given to you by your health care provider. Make sure you discuss any questions you have with your health care provider. Document Released: 05/26/2005 Document Revised: 10/03/2016 Document Reviewed: 10/03/2016 Elsevier Interactive Patient Education  [employer].

## 2018-07-23 NOTE — Assessment & Plan Note (Signed)
Resolving today Continue Symbicort Follow-up in 2 to 4 weeks with our office to ensure still resolving

## 2018-07-23 NOTE — Assessment & Plan Note (Signed)
Improved today Walk today in office on Room Air  >>> You maintain 97% or greater on your walk this is good news  Please purchase an SPO2 monitor so you can monitor your oxygen at home Please check your oxygen levels routinely throughout the day to ensure that your oxygen levels are above 90% If they drop below 90% please apply oxygen   Follow-up with our office in 2 to 4 weeks

## 2018-07-23 NOTE — Progress Notes (Signed)
Reviewed, agree 

## 2018-07-23 NOTE — Assessment & Plan Note (Signed)
Follow up with primary care about dementia  >>>regarding his dementia  >>>help with medication coordination  Diabetic diet information provided for patient today per patient spouse request Encouraged patient spouse to follow-up with primary care regarding additional information and potential referral to nutritionist  Follow-up with our office in 2 to 4 weeks

## 2018-08-06 ENCOUNTER — Ambulatory Visit: Payer: Medicare HMO | Admitting: Pulmonary Disease

## 2018-08-06 ENCOUNTER — Encounter: Payer: Self-pay | Admitting: Pulmonary Disease

## 2018-08-06 DIAGNOSIS — Z79899 Other long term (current) drug therapy: Secondary | ICD-10-CM | POA: Diagnosis not present

## 2018-08-06 DIAGNOSIS — J438 Other emphysema: Secondary | ICD-10-CM

## 2018-08-06 NOTE — Patient Instructions (Addendum)
Continue Symbicort 160 >>> 2 puffs in the morning right when you wake up, rinse out your mouth after use, 12 hours later 2 puffs, rinse after use >>> Take this daily, no matter what >>> This is not a rescue inhaler   Only use your albuterol as a rescue medication to be used if you can't catch your breath by resting or doing a relaxed purse lip breathing pattern.  - The less you use it, the better it will work when you need it. - Ok to use up to 2 puffs  every 4 hours if you must but call for immediate appointment if use goes up over your usual need - Don't leave home without it !!  (think of it like the spare tire for your car)   Follow-up with our office in 2 months or sooner if symptoms are not improving >>> This appointment can be with Dr. Lake Bells or Wyn Quaker, NP  It is flu season:   >>>Remember to be washing your hands regularly, using hand sanitizer, be careful to use around herself with has contact with people who are sick will increase her chances of getting sick yourself. >>> Best ways to protect herself from the flu: Receive the yearly flu vaccine, practice good hand hygiene washing with soap and also using hand sanitizer when available, eat a nutritious meals, get adequate rest, hydrate appropriately   Please contact the office if your symptoms worsen or you have concerns that you are not improving.   Thank you for choosing [facility] for your healthcare, and for allowing [place] to partner with you on your healthcare journey. I am thankful to be able to provide care to you today.   Wyn Quaker FNP-C

## 2018-08-06 NOTE — Assessment & Plan Note (Signed)
Continue Symbicort 160 >>> 2 puffs in the morning right when you wake up, rinse out your mouth after use, 12 hours later 2 puffs, rinse after use >>> Take this daily, no matter what >>> This is not a rescue inhaler   Only use your albuterol as a rescue medication to be used if you can't catch your breath by resting or doing a relaxed purse lip breathing pattern.  - The less you use it, the better it will work when you need it. - Ok to use up to 2 puffs  every 4 hours if you must but call for immediate appointment if use goes up over your usual need - Don't leave home without it !!  (think of it like the spare tire for your car)   Follow-up with our office in 2 months or sooner if symptoms are not improving >>> This appointment can be with Dr. Lake Bells or Wyn Quaker, NP

## 2018-08-06 NOTE — Assessment & Plan Note (Signed)
Continue to work on medication adherence and ensure you are taking all your medications as prescribed  Continue follow-up with primary care regarding medication adherence and coordinating your health care

## 2018-08-06 NOTE — Progress Notes (Signed)
@Patient  ID: [name], male    DOB: [birth year], 83 y.o.   MRN: [id number]  Chief Complaint  Patient presents with  . Emphysema    COPD follow up     Referring provider: Graylon Good, MD  HPI:  83 year old male former smoker followed in our office for COPD  PMH: Hypothyroidism, GERD, allergic rhinitis Smoker/ Smoking History: Former smoker.  120-pack-year smoking history.  Quit 1997. Maintenance: Symbicort 160 Pt of: [place]   08/06/2018  - Visit   83 year old male patient presenting today for follow-up visit.  Patient reports his breathing has significantly improved since last office visit.  Patient has not needed to use his rescue inhaler at all.  Patient and wife both confirm the patient has been consistently trying to adhere to Symbicort 160 twice daily dosing for a total of 4 puffs a day.  They report that he typically is able to achieve this about 80% of the time.  Sometimes he does forget his morning dose.  They continue to work on this.  Patient with known confusion.  They have followed up with primary care after last office visit had a very referral to see neurology in February/2020.  Spouse reports that patient has been doing better she is to continue to work with her on medication adherence.   MMRC - Breathlessness Score 2 - on level ground, I walk slower than people of the same age because of breathlessness, or have to stop for breathe when walking to my own pace   Tests:  01/23/2015-spirometry- FVC 2.1 (47% predicted), ratio 55, FEV1 34% >>>Moderate airflow obstruction  01/19/2015-echocardiogram-LV ejection fraction 55 to 60%,  FENO:  No results found for: NITRICOXIDE  PFT: No flowsheet data found.  Imaging: No results found.  Chart Review:    Specialty Problems      Pulmonary Problems   COPD with emphysema ([place])    PFT's 11/24/2011  FEV1  1.97 (66%) ratio 55 and no better with B2, dlco 94% [name] 01/2015:  FEV1 1.16 (34%), ratio 55, low FVC  probably due to airtrapping.       COPD exacerbation (HCC)   Allergic rhinitis   Chronic respiratory failure with hypoxia (HCC)      No Known Allergies  Immunization History  Administered Date(s) Administered  . Influenza Split 06/13/2011  . Influenza, High Dose Seasonal PF 08/18/2016, 07/12/2017  . Influenza, Seasonal, Injecte, Preservative Fre 07/31/2013, 07/09/2015  . Influenza,inj,Quad PF,6+ Mos 07/31/2013, 07/09/2015  . Influenza-Unspecified 06/01/2018  . Pneumococcal Conjugate-13 07/09/2015  . Pneumococcal Polysaccharide-23 06/12/2010  . Zoster Recombinat (Shingrix) 03/03/2017, 05/31/2017    Past Medical History:  Diagnosis Date  . Allergic rhinitis   . Anxiety   . Arthritis   . Asthma   . CAD (coronary artery disease)    cath 7/09: pCFX 25%, mRCA 25%, EF 60%;  Myoview 6/11: low risk, EF 67%  . COPD (chronic obstructive pulmonary disease) ([place])    admitted 5/11, uses oxygen 2 liters/min prn rare use  . First degree AV block   . GERD (gastroesophageal reflux disease)   . Heart murmur   . Hiatal hernia   . History of TIAs   . Hyperlipidemia   . Hypertension   . Paroxysmal atrial fibrillation (HCC)    coumadin therapy;  echo 1/10: EF 65-70%, mild diast dysfxn  . Pneumonia    hx of   . Sebaceous cyst    scalp  . Shortness of breath dyspnea    with exertion   .  SVT (supraventricular tachycardia) ([place])    s/p RFCA 05/2010  . Tubular adenoma 2014   Dr. Hilarie Fredrickson    Tobacco History: Social History   Tobacco Use  Smoking Status Former Smoker  . Packs/day: 3.00  . Years: 40.00  . Pack years: 120.00  . Types: Cigarettes  . Last attempt to quit: 09/13/1995  . Years since quitting: 22.9  Smokeless Tobacco Never Used   Counseling given: Yes  Continue to not smoke  Outpatient Encounter Medications as of 08/06/2018  Medication Sig  . albuterol (PROVENTIL HFA;VENTOLIN HFA) 108 (90 Base) MCG/ACT inhaler Inhale 2 puffs every 4 (four) hours as needed into the  lungs for wheezing or shortness of breath.  . budesonide-formoterol (SYMBICORT) 160-4.5 MCG/ACT inhaler Inhale 2 puffs 2 (two) times daily into the lungs.  . Calcium Carb-Cholecalciferol (CALCIUM 600 + D PO) Take 1 tablet by mouth every morning.  Marland Kitchen ipratropium-albuterol (DUONEB) 0.5-2.5 (3) MG/3ML SOLN Take 3 mLs by nebulization every 4 (four) hours as needed (shortness of breath/ wheezing).  . magnesium oxide (MAG-OX) 400 MG tablet Take 400 mg by mouth every morning.   . Misc. Devices ( Head) MISC As directed  . multivitamin-iron-minerals-folic acid (CENTRUM) chewable tablet Chew 1 tablet by mouth every morning.   Marland Kitchen OVER THE COUNTER MEDICATION Leader allergy tablet chlorpheniramine antihistamine tablet daily  . vitamin B-12 (CYANOCOBALAMIN) 1000 MCG tablet Take 1,000 mcg by mouth every morning.   . warfarin (COUMADIN) 3 MG tablet Take 3 mg by mouth daily.   . [DISCONTINUED] budesonide-formoterol (SYMBICORT) 160-4.5 MCG/ACT inhaler Inhale 2 puffs into the lungs 2 (two) times daily.  . [DISCONTINUED] atorvastatin (LIPITOR) 20 MG tablet Take 20 mg by mouth daily.    . [DISCONTINUED] clonazePAM (KLONOPIN) 0.5 MG tablet Take 0.5 mg by mouth at bedtime as needed. For sleep   . [DISCONTINUED] furosemide (LASIX) 20 MG tablet Take 20 mg by mouth daily.     No facility-administered encounter medications on file as of 08/06/2018.      Review of Systems  Review of Systems  Constitutional: Negative for activity change, chills, fatigue, fever and unexpected weight change.  HENT: Negative for congestion, postnasal drip, rhinorrhea and sore throat.   Eyes: Negative.   Respiratory: Negative for cough, shortness of breath and wheezing.   Cardiovascular: Negative for chest pain and palpitations.  Gastrointestinal: Negative for constipation, diarrhea, nausea and vomiting.  Endocrine: Negative.   Musculoskeletal: Negative.   Skin: Negative.   Neurological: Negative for dizziness and headaches.    Psychiatric/Behavioral: Negative.  Negative for dysphoric mood. The patient is not nervous/anxious.   All other systems reviewed and are negative.    Physical Exam  BP 134/72 (BP Location: Left Arm, Cuff Size: Normal)   Pulse 96   Ht 5' 11.5" (1.816 m)   Wt 185 lb (83.9 kg)   SpO2 94%   BMI 25.44 kg/m   Wt Readings from Last 5 Encounters:  08/06/18 185 lb (83.9 kg)  07/23/18 183 lb (83 kg)  08/10/17 195 lb 12.8 oz (88.8 kg)  07/26/17 200 lb (90.7 kg)  05/17/17 200 lb 6.4 oz (90.9 kg)   RA  Physical Exam  Constitutional: He is oriented to person, place, and time and well-developed, well-nourished, and in no distress. Vital signs are normal. No distress.  HENT:  Head: Normocephalic and atraumatic.  Right Ear: Hearing, external ear and ear canal normal.  Left Ear: Hearing, external ear and ear canal normal.  Nose: Nose normal. Right sinus exhibits no maxillary  sinus tenderness and no frontal sinus tenderness. Left sinus exhibits no maxillary sinus tenderness and no frontal sinus tenderness.  Mouth/Throat: Uvula is midline and oropharynx is clear and moist. No oropharyngeal exudate.  + Difficult to visualize TMs due to cerumen bilaterally and ear canals, does not appear to be infected  Eyes: Pupils are equal, round, and reactive to light.  Neck: Normal range of motion. Neck supple.  Cardiovascular: Normal rate, regular rhythm and normal heart sounds.  Pulmonary/Chest: Effort normal and breath sounds normal. No accessory muscle usage. No respiratory distress. He has no decreased breath sounds. He has no wheezes. He has no rhonchi.  Improved airway movement from previous office assessment  Musculoskeletal: Normal range of motion. He exhibits no edema.  Lymphadenopathy:    He has no cervical adenopathy.  Neurological: He is alert and oriented to person, place, and time. Gait normal.  Skin: Skin is warm and dry. He is not diaphoretic. No erythema.  Psychiatric: Mood and affect  normal.  + Intermittent confusion at times  Nursing note and vitals reviewed.     Lab Results:  CBC    Component Value Date/Time   WBC 10.0 03/13/2015 0524   RBC 4.12 (L) 03/13/2015 0524   HGB 12.3 (L) 03/13/2015 0524   HCT 37.5 (L) 03/13/2015 0524   PLT 191 03/13/2015 0524   MCV 91.0 03/13/2015 0524   MCV 87.0 07/15/2013 1425   MCH 29.9 03/13/2015 0524   MCHC 32.8 03/13/2015 0524   RDW 13.8 03/13/2015 0524   LYMPHSABS 2.6 03/04/2015 0840   MONOABS 0.8 03/04/2015 0840   EOSABS 0.7 03/04/2015 0840   BASOSABS 0.0 03/04/2015 0840    BMET    Component Value Date/Time   NA 139 03/13/2015 0524   NA 138 07/15/2013 1111   K 4.4 03/13/2015 0524   CL 102 03/13/2015 0524   CO2 31 03/13/2015 0524   GLUCOSE 96 03/13/2015 0524   BUN 22 (H) 03/13/2015 0524   BUN 11 07/15/2013 1111   CREATININE 0.84 03/13/2015 0524   CREATININE 1.01 01/01/2013 1045   CALCIUM 9.0 03/13/2015 0524   GFRNONAA >60 03/13/2015 0524   GFRNONAA 72 01/01/2013 1045   GFRAA >60 03/13/2015 0524   GFRAA 84 01/01/2013 1045    BNP No results found for: BNP  ProBNP    Component Value Date/Time   PROBNP 51.4 09/30/2012 1600      Assessment & Plan:   Pleasant 83 year old male patient complaining follow-up with our office today.  Patient looks significantly better from last office visit.  On exam patient does not have wheezing which is an improvement also from last office visit.  Patient is now improved his adherence to Symbicort 160 and attributes this to also been able to not have to use his rescue inhaler at all since last office visit.  Patient did follow-up with primary care after last office visit to discuss confusion.  I have referred patient to neurology in February/2020 where they will continue to work on medication adherence in the meantime.  I encouraged patient spouse to contact neurology office to see if they can get scheduled replaced on a cancellation list to hopefully be able to be seen sooner  than February/2020.  COPD with emphysema (HCC) Continue Symbicort 160 >>> 2 puffs in the morning right when you wake up, rinse out your mouth after use, 12 hours later 2 puffs, rinse after use >>> Take this daily, no matter what >>> This is not a rescue inhaler  Only use your albuterol as a rescue medication to be used if you can't catch your breath by resting or doing a relaxed purse lip breathing pattern.  - The less you use it, the better it will work when you need it. - Ok to use up to 2 puffs  every 4 hours if you must but call for immediate appointment if use goes up over your usual need - Don't leave home without it !!  (think of it like the spare tire for your car)   Follow-up with our office in 2 months or sooner if symptoms are not improving >>> This appointment can be with Dr. Lake Bells or Wyn Quaker, NP  Medication management Continue to work on medication adherence and ensure you are taking all your medications as prescribed  Continue follow-up with primary care regarding medication adherence and coordinating your health care   This appointment was 26 minutes along with over 50% of the time direct face-to-face patient care, assessment, plan of care follow-up.  Lauraine Rinne, NP 08/06/2018

## 2018-08-07 NOTE — Progress Notes (Signed)
Reviewed, agree 

## 2018-10-09 ENCOUNTER — Encounter: Payer: Self-pay | Admitting: Pulmonary Disease

## 2018-10-09 ENCOUNTER — Ambulatory Visit: Payer: Medicare HMO | Admitting: Pulmonary Disease

## 2018-10-09 VITALS — BP 110/56 | HR 101 | Ht 71.5 in | Wt 187.6 lb

## 2018-10-09 DIAGNOSIS — J9611 Chronic respiratory failure with hypoxia: Secondary | ICD-10-CM | POA: Diagnosis not present

## 2018-10-09 DIAGNOSIS — J438 Other emphysema: Secondary | ICD-10-CM

## 2018-10-09 NOTE — Progress Notes (Signed)
Reviewed, agree 

## 2018-10-09 NOTE — Assessment & Plan Note (Signed)
Assessment: Office visit on 07/23/2018-walk in office on room air maintain 97% or greater SPO2 95% today Patient has home oxygen from a hospital discharge years ago per patient and spouse Patient unsure if he should be wearing this at night  Plan: Overnight oximetry study ordered

## 2018-10-09 NOTE — Progress Notes (Signed)
@Patient  ID: [name], male    DOB: 10-26-1934, 84 y.o.   MRN: [id number]  Chief Complaint  Patient presents with  . Follow-up    COPD follow up     Referring provider: Graylon Good, MD  HPI:  84 year old male former smoker followed in our office for COPD  PMH: Hypothyroidism, GERD, allergic rhinitis Smoker/ Smoking History: Former smoker.  120-pack-year smoking history.  Quit 1997. Maintenance: Symbicort 160 Pt of: [place]   10/09/2018  - Visit   84 year old male former smoker presenting to our office today for a COPD/emphysema follow-up.  Patient reports that breathing has been okay slightly improved since last office visit.  Patient is maintained on Symbicort 160 twice a day, uses his albuterol nebulized medications twice a day, uses his rescue inhaler about every other day.  Patient reports is the best has been breathing in the past 3 to 4 months.  Patient spouse has been working with him to ensure his adherence to Symbicort 160 as well as making sure he is not taking Symbicort 160 to work where he would routinely use it as a rescue inhaler.  Patient does have some baseline shortness of breath, occasional cough which is his baseline and is productive with white mucus, and wheezing with exertion.  Patient presents today with his spouse.  Spouse and patient report they saw neurology yesterday and patient was diagnosed with dementia.  Patient spouse reports they have ordered a brain MRI and wanted the patient to stop driving.  The patient refused to stop driving at this time.  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  MMRC - Breathlessness Score 2 - on level ground, I walk slower than people of the same age because of breathlessness, or have to stop for breathe when walking to my own pace     Tests:  01/23/2015-spirometry- FVC 2.1 (47%  predicted), ratio 55, FEV1 34% >>>Moderate airflow obstruction  01/19/2015-echocardiogram-LV ejection fraction 55 to 60%,  FENO:  No results found for: NITRICOXIDE  PFT: No flowsheet data found.  Imaging: No results found.    Specialty Problems      Pulmonary Problems   COPD with emphysema ([place])    PFT's 11/24/2011  FEV1  1.97 (66%) ratio 55 and no better with B2, dlco 94% [name] 01/2015:  FEV1 1.16 (34%), ratio 55, low FVC probably due to airtrapping.       COPD exacerbation (HCC)   Allergic rhinitis   Chronic respiratory failure with hypoxia (HCC)      No Known Allergies  Immunization History  Administered Date(s) Administered  . Influenza Split 06/13/2011  . Influenza, High Dose Seasonal PF 08/18/2016, 07/12/2017  . Influenza, Seasonal, Injecte, Preservative Fre 07/31/2013, 07/09/2015  . Influenza,inj,Quad PF,6+ Mos 07/31/2013, 07/09/2015  . Influenza-Unspecified 06/01/2018  . Pneumococcal Conjugate-13 07/09/2015  . Pneumococcal Polysaccharide-23 06/12/2010  . Zoster Recombinat (Shingrix) 03/03/2017, 05/31/2017    Past Medical History:  Diagnosis Date  . Allergic rhinitis   . Anxiety   . Arthritis   . Asthma   . CAD (coronary artery disease)    cath 7/09: pCFX 25%, mRCA 25%, EF 60%;  Myoview 6/11: low risk, EF 67%  . COPD (chronic obstructive pulmonary disease) ([place])    admitted 5/11, uses oxygen 2 liters/min prn rare use  . First degree AV block   .  GERD (gastroesophageal reflux disease)   . Heart murmur   . Hiatal hernia   . History of TIAs   . Hyperlipidemia   . Hypertension   . Paroxysmal atrial fibrillation (HCC)    coumadin therapy;  echo 1/10: EF 65-70%, mild diast dysfxn  . Pneumonia    hx of   . Sebaceous cyst    scalp  . Shortness of breath dyspnea    with exertion   . SVT (supraventricular tachycardia) ([place])    s/p RFCA 05/2010  . Tubular adenoma 2014   Dr. Hilarie Fredrickson    Tobacco History: Social History   Tobacco Use  Smoking Status  Former Smoker  . Packs/day: 3.00  . Years: 40.00  . Pack years: 120.00  . Types: Cigarettes  . Last attempt to quit: 09/13/1995  . Years since quitting: 23.0  Smokeless Tobacco Never Used   Counseling given: Yes  Continue to not smoke  Outpatient Encounter Medications as of 10/09/2018  Medication Sig  . albuterol (PROVENTIL HFA;VENTOLIN HFA) 108 (90 Base) MCG/ACT inhaler Inhale 2 puffs every 4 (four) hours as needed into the lungs for wheezing or shortness of breath.  . budesonide-formoterol (SYMBICORT) 160-4.5 MCG/ACT inhaler Inhale 2 puffs 2 (two) times daily into the lungs.  . Calcium Carb-Cholecalciferol (CALCIUM 600 + D PO) Take 1 tablet by mouth every morning.  Marland Kitchen ipratropium-albuterol (DUONEB) 0.5-2.5 (3) MG/3ML SOLN Take 3 mLs by nebulization every 4 (four) hours as needed (shortness of breath/ wheezing).  . magnesium oxide (MAG-OX) 400 MG tablet Take 400 mg by mouth every morning.   . Misc. Devices ([place]) MISC As directed  . multivitamin-iron-minerals-folic acid (CENTRUM) chewable tablet Chew 1 tablet by mouth every morning.   Marland Kitchen OVER THE COUNTER MEDICATION Leader allergy tablet chlorpheniramine antihistamine tablet daily  . vitamin B-12 (CYANOCOBALAMIN) 1000 MCG tablet Take 1,000 mcg by mouth every morning.   . warfarin (COUMADIN) 3 MG tablet Take 3 mg by mouth daily.   . [DISCONTINUED] atorvastatin (LIPITOR) 20 MG tablet Take 20 mg by mouth daily.    . [DISCONTINUED] clonazePAM (KLONOPIN) 0.5 MG tablet Take 0.5 mg by mouth at bedtime as needed. For sleep   . [DISCONTINUED] furosemide (LASIX) 20 MG tablet Take 20 mg by mouth daily.     No facility-administered encounter medications on file as of 10/09/2018.      Review of Systems  Review of Systems  Constitutional: Negative for fatigue and fever.  HENT: Negative for congestion.   Respiratory: Positive for cough, shortness of breath (baseline ) and wheezing (with exertion ). Negative for chest tightness.     Cardiovascular: Negative for chest pain and palpitations.     Physical Exam  BP (!) 110/56 (BP Location: Left Arm, Cuff Size: Normal)   Pulse (!) 101   Ht 5' 11.5" (1.816 m)   Wt 187 lb 9.6 oz (85.1 kg)   SpO2 95%   BMI 25.80 kg/m   Wt Readings from Last 5 Encounters:  10/09/18 187 lb 9.6 oz (85.1 kg)  08/06/18 185 lb (83.9 kg)  07/23/18 183 lb (83 kg)  08/10/17 195 lb 12.8 oz (88.8 kg)  07/26/17 200 lb (90.7 kg)    Physical Exam  Constitutional: He is oriented to person, place, and time and well-developed, well-nourished, and in no distress. No distress.  HENT:  Head: Normocephalic and atraumatic.  Right Ear: Hearing, tympanic membrane, external ear and ear canal normal.  Left Ear: Hearing, tympanic membrane, external ear and ear canal normal.  Nose: Nose normal. Right sinus exhibits no maxillary sinus tenderness and no frontal sinus tenderness. Left sinus exhibits no maxillary sinus tenderness and no frontal sinus tenderness.  Mouth/Throat: Uvula is midline.  Eyes: Pupils are equal, round, and reactive to light.  Neck: Normal range of motion. Neck supple. No JVD present.  Cardiovascular: Normal rate, regular rhythm and normal heart sounds.  Pulmonary/Chest: Effort normal and breath sounds normal. No accessory muscle usage. No respiratory distress. He has no decreased breath sounds. He has no wheezes. He has no rhonchi. He has no rales.  Musculoskeletal: Normal range of motion.        General: No edema.  Lymphadenopathy:    He has no cervical adenopathy.  Neurological: He is alert and oriented to person, place, and time. Gait normal.  Skin: Skin is warm and dry. He is not diaphoretic. No erythema.  Psychiatric: Mood, affect and judgment normal.  + Recent diagnosis of dementia, currently managed on Namenda per spouse, being followed by neurology  Nursing note and vitals reviewed.     Lab Results:  CBC    Component Value Date/Time   WBC 10.0 03/13/2015 0524   RBC  4.12 (L) 03/13/2015 0524   HGB 12.3 (L) 03/13/2015 0524   HCT 37.5 (L) 03/13/2015 0524   PLT 191 03/13/2015 0524   MCV 91.0 03/13/2015 0524   MCV 87.0 07/15/2013 1425   MCH 29.9 03/13/2015 0524   MCHC 32.8 03/13/2015 0524   RDW 13.8 03/13/2015 0524   LYMPHSABS 2.6 03/04/2015 0840   MONOABS 0.8 03/04/2015 0840   EOSABS 0.7 03/04/2015 0840   BASOSABS 0.0 03/04/2015 0840    BMET    Component Value Date/Time   NA 139 03/13/2015 0524   NA 138 07/15/2013 1111   K 4.4 03/13/2015 0524   CL 102 03/13/2015 0524   CO2 31 03/13/2015 0524   GLUCOSE 96 03/13/2015 0524   BUN 22 (H) 03/13/2015 0524   BUN 11 07/15/2013 1111   CREATININE 0.84 03/13/2015 0524   CREATININE 1.01 01/01/2013 1045   CALCIUM 9.0 03/13/2015 0524   GFRNONAA >60 03/13/2015 0524   GFRNONAA 72 01/01/2013 1045   GFRAA >60 03/13/2015 0524   GFRAA 84 01/01/2013 1045    BNP No results found for: BNP  ProBNP    Component Value Date/Time   PROBNP 51.4 09/30/2012 1600      Assessment & Plan:     COPD with emphysema ([place]) Assessment: Maintained on Symbicort 160 twice daily Maintained on duo nebs twice daily Saba use 1 time about every other day Patient reports breathing has been well managed mMRC 2 Patient still working daily Lungs clear to auscultation today 2016 spirometry shows FEV1 of 1.16-34% predicted  Plan: Continue Symbicort 160 twice daily Continue duo nebs twice a day Continue rescue inhaler use as needed Will order overnight oximetry study Follow-up with our office in 3 months or sooner if symptoms worsen   Chronic respiratory failure with hypoxia ([place]) Assessment: Office visit on 07/23/2018-walk in office on room air maintain 97% or greater SPO2 95% today Patient has home oxygen from a hospital discharge years ago per patient and spouse Patient unsure if he should be wearing this at night  Plan: Overnight oximetry study ordered     Lauraine Rinne, NP 10/09/2018   This  appointment was 26 min long with over 50% of the time in direct face-to-face patient care, assessment, plan of care, and follow-up.

## 2018-10-09 NOTE — Patient Instructions (Signed)
Continue Symbicort 160 >>> 2 puffs in the morning right when you wake up, rinse out your mouth after use, 12 hours later 2 puffs, rinse after use >>> Take this daily, no matter what >>> This is not a rescue inhaler   Continue DuoNeb nebulized medications twice a day can increase to 3 times a day (every 8 hours as needed)  Only use your albuterol as a rescue medication to be used if you can't catch your breath by resting or doing a relaxed purse lip breathing pattern.  - The less you use it, the better it will work when you need it. - Ok to use up to 2 puffs  every 4 hours if you must but call for immediate appointment if use goes up over your usual need - Don't leave home without it !!  (think of it like the spare tire for your car)   Will order overnight oximetry test to check for oxygen needs at night  Continue follow-up with neurology Continue follow-up with primary care    Follow-up with our office in 3 months with Dr. Lake Bells preferably, if nothing available okay to see Wyn Quaker, NP      It is flu season:   >>>Remember to be washing your hands regularly, using hand sanitizer, be careful to use around herself with has contact with people who are sick will increase her chances of getting sick yourself. >>> Best ways to protect herself from the flu: Receive the yearly flu vaccine, practice good hand hygiene washing with soap and also using hand sanitizer when available, eat a nutritious meals, get adequate rest, hydrate appropriately   Please contact the office if your symptoms worsen or you have concerns that you are not improving.   Thank you for choosing [facility] for your healthcare, and for allowing [place] to partner with you on your healthcare journey. I am thankful to be able to provide care to you today.   Wyn Quaker FNP-C

## 2018-10-09 NOTE — Assessment & Plan Note (Signed)
Assessment: Maintained on Symbicort 160 twice daily Maintained on duo nebs twice daily Saba use 1 time about every other day Patient reports breathing has been well managed mMRC 2 Patient still working daily Lungs clear to auscultation today 2016 spirometry shows FEV1 of 1.16-34% predicted  Plan: Continue Symbicort 160 twice daily Continue duo nebs twice a day Continue rescue inhaler use as needed Will order overnight oximetry study Follow-up with our office in 3 months or sooner if symptoms worsen

## 2018-11-27 ENCOUNTER — Telehealth: Payer: Self-pay | Admitting: *Deleted

## 2018-11-27 NOTE — Telephone Encounter (Signed)
Received fax from [place] stating the the pt's ONO that we ordered was cancelled due to the pt moving Wyn Quaker made aware

## 2018-12-18 ENCOUNTER — Telehealth: Payer: Self-pay | Admitting: Pulmonary Disease

## 2018-12-18 MED ORDER — BUDESONIDE-FORMOTEROL FUMARATE 160-4.5 MCG/ACT IN AERO: 2.0000 | INHALATION_SPRAY | Freq: Two times a day (BID) | RESPIRATORY_TRACT | 5 refills | Status: AC

## 2018-12-18 NOTE — Telephone Encounter (Signed)
Spoke with pt and sent refills to preferred pharmacy.  Nothing further is needed. 

## 2019-01-08 ENCOUNTER — Ambulatory Visit: Payer: Medicare HMO | Admitting: Pulmonary Disease

## 2019-05-29 ENCOUNTER — Telehealth: Payer: Self-pay | Admitting: Pulmonary Disease

## 2019-05-29 NOTE — Telephone Encounter (Signed)
Left message for [name] to call back.

## 2019-05-30 NOTE — Telephone Encounter (Signed)
I called pt's wife and she stated she mailed out a release form to send records to [facility]. We will await the records release form and proceed. Please leave in triage.

## 2019-06-06 NOTE — Telephone Encounter (Signed)
Called spoke with Zigmund Daniel, let her know that we don't send medical records, that is all done through out Medical records department. I did not find the form in Dr. Kathee Delton or Marquita Palms mailboxes. I gave the phone number and address to Medical records to [place]. She voiced understanding. Nothing further needed at this time.

## 2021-04-12 DEATH — deceased
